# Patient Record
Sex: Female | Born: 1950
Health system: Southern US, Community
[De-identification: ages and names within clinical notes are randomized; demographics above are authoritative.]

## PROBLEM LIST (undated history)

## (undated) DIAGNOSIS — Z860101 Personal history of adenomatous and serrated colon polyps: Secondary | ICD-10-CM

## (undated) DIAGNOSIS — Z86010 Personal history of colonic polyps: Secondary | ICD-10-CM

## (undated) DIAGNOSIS — F338 Other recurrent depressive disorders: Secondary | ICD-10-CM

## (undated) DIAGNOSIS — T7840XA Allergy, unspecified, initial encounter: Secondary | ICD-10-CM

## (undated) DIAGNOSIS — Z780 Asymptomatic menopausal state: Secondary | ICD-10-CM

## (undated) DIAGNOSIS — M549 Dorsalgia, unspecified: Secondary | ICD-10-CM

## (undated) DIAGNOSIS — E785 Hyperlipidemia, unspecified: Secondary | ICD-10-CM

## (undated) DIAGNOSIS — E7800 Pure hypercholesterolemia, unspecified: Secondary | ICD-10-CM

## (undated) HISTORY — DX: Other recurrent depressive disorders: F33.8

## (undated) HISTORY — DX: Dorsalgia, unspecified: M54.9

## (undated) HISTORY — DX: Personal history of colonic polyps: Z86.010

## (undated) HISTORY — PX: COLONOSCOPY: SHX174

## (undated) HISTORY — DX: Allergy, unspecified, initial encounter: T78.40XA

## (undated) HISTORY — DX: Personal history of adenomatous and serrated colon polyps: Z86.0101

## (undated) HISTORY — DX: Hyperlipidemia, unspecified: E78.5

## (undated) HISTORY — DX: Asymptomatic menopausal state: Z78.0

---

## 1998-08-17 ENCOUNTER — Ambulatory Visit (HOSPITAL_COMMUNITY): Admission: RE | Admit: 1998-08-17 | Discharge: 1998-08-17 | Payer: Self-pay | Admitting: Obstetrics and Gynecology

## 1999-02-09 ENCOUNTER — Encounter: Payer: Self-pay | Admitting: Family Medicine

## 1999-02-09 ENCOUNTER — Ambulatory Visit (HOSPITAL_COMMUNITY): Admission: RE | Admit: 1999-02-09 | Discharge: 1999-02-09 | Payer: Self-pay | Admitting: Obstetrics and Gynecology

## 1999-03-16 ENCOUNTER — Encounter (INDEPENDENT_AMBULATORY_CARE_PROVIDER_SITE_OTHER): Payer: Self-pay | Admitting: Specialist

## 1999-03-16 ENCOUNTER — Other Ambulatory Visit: Admission: RE | Admit: 1999-03-16 | Discharge: 1999-03-16 | Payer: Self-pay | Admitting: Obstetrics and Gynecology

## 2000-02-15 ENCOUNTER — Encounter: Payer: Self-pay | Admitting: Family Medicine

## 2000-02-15 ENCOUNTER — Ambulatory Visit (HOSPITAL_COMMUNITY): Admission: RE | Admit: 2000-02-15 | Discharge: 2000-02-15 | Payer: Self-pay | Admitting: Family Medicine

## 2000-11-23 ENCOUNTER — Emergency Department (HOSPITAL_COMMUNITY): Admission: EM | Admit: 2000-11-23 | Discharge: 2000-11-24 | Payer: Self-pay | Admitting: Emergency Medicine

## 2000-11-24 ENCOUNTER — Encounter: Payer: Self-pay | Admitting: Emergency Medicine

## 2001-02-27 ENCOUNTER — Encounter: Payer: Self-pay | Admitting: Family Medicine

## 2001-02-27 ENCOUNTER — Ambulatory Visit (HOSPITAL_COMMUNITY): Admission: RE | Admit: 2001-02-27 | Discharge: 2001-02-27 | Payer: Self-pay | Admitting: Family Medicine

## 2001-04-04 ENCOUNTER — Encounter (INDEPENDENT_AMBULATORY_CARE_PROVIDER_SITE_OTHER): Payer: Self-pay | Admitting: Specialist

## 2001-04-04 ENCOUNTER — Ambulatory Visit (HOSPITAL_COMMUNITY): Admission: RE | Admit: 2001-04-04 | Discharge: 2001-04-04 | Payer: Self-pay | Admitting: *Deleted

## 2001-12-03 ENCOUNTER — Encounter: Payer: Self-pay | Admitting: Obstetrics and Gynecology

## 2001-12-03 ENCOUNTER — Encounter: Admission: RE | Admit: 2001-12-03 | Discharge: 2001-12-03 | Payer: Self-pay | Admitting: Obstetrics and Gynecology

## 2002-04-03 ENCOUNTER — Encounter: Payer: Self-pay | Admitting: Obstetrics and Gynecology

## 2002-04-03 ENCOUNTER — Encounter: Admission: RE | Admit: 2002-04-03 | Discharge: 2002-04-03 | Payer: Self-pay | Admitting: Obstetrics and Gynecology

## 2003-02-03 ENCOUNTER — Other Ambulatory Visit: Admission: RE | Admit: 2003-02-03 | Discharge: 2003-02-03 | Payer: Self-pay | Admitting: Obstetrics and Gynecology

## 2003-04-07 ENCOUNTER — Encounter: Payer: Self-pay | Admitting: Obstetrics and Gynecology

## 2003-04-07 ENCOUNTER — Encounter: Admission: RE | Admit: 2003-04-07 | Discharge: 2003-04-07 | Payer: Self-pay | Admitting: Obstetrics and Gynecology

## 2004-02-09 ENCOUNTER — Other Ambulatory Visit: Admission: RE | Admit: 2004-02-09 | Discharge: 2004-02-09 | Payer: Self-pay | Admitting: Obstetrics and Gynecology

## 2004-05-17 ENCOUNTER — Encounter: Admission: RE | Admit: 2004-05-17 | Discharge: 2004-05-17 | Payer: Self-pay | Admitting: Obstetrics and Gynecology

## 2004-10-11 ENCOUNTER — Ambulatory Visit (HOSPITAL_COMMUNITY): Admission: RE | Admit: 2004-10-11 | Discharge: 2004-10-11 | Payer: Self-pay | Admitting: *Deleted

## 2005-06-02 ENCOUNTER — Encounter: Admission: RE | Admit: 2005-06-02 | Discharge: 2005-06-02 | Payer: Self-pay | Admitting: Family Medicine

## 2006-01-18 ENCOUNTER — Encounter: Payer: Self-pay | Admitting: Obstetrics and Gynecology

## 2006-04-03 ENCOUNTER — Ambulatory Visit: Payer: Self-pay | Admitting: Family Medicine

## 2006-06-05 ENCOUNTER — Encounter: Admission: RE | Admit: 2006-06-05 | Discharge: 2006-06-05 | Payer: Self-pay | Admitting: Family Medicine

## 2006-06-09 ENCOUNTER — Ambulatory Visit: Payer: Self-pay | Admitting: Family Medicine

## 2006-06-16 ENCOUNTER — Ambulatory Visit: Payer: Self-pay | Admitting: Family Medicine

## 2006-12-19 ENCOUNTER — Ambulatory Visit: Payer: Self-pay | Admitting: Family Medicine

## 2007-04-27 ENCOUNTER — Ambulatory Visit: Payer: Self-pay | Admitting: Family Medicine

## 2007-06-13 ENCOUNTER — Encounter: Admission: RE | Admit: 2007-06-13 | Discharge: 2007-06-13 | Payer: Self-pay | Admitting: Obstetrics and Gynecology

## 2007-06-19 ENCOUNTER — Ambulatory Visit: Payer: Self-pay | Admitting: Family Medicine

## 2007-09-26 ENCOUNTER — Ambulatory Visit: Payer: Self-pay | Admitting: Family Medicine

## 2007-12-21 ENCOUNTER — Ambulatory Visit: Payer: Self-pay | Admitting: Family Medicine

## 2008-02-21 ENCOUNTER — Ambulatory Visit: Payer: Self-pay | Admitting: Family Medicine

## 2008-07-03 ENCOUNTER — Ambulatory Visit: Payer: Self-pay | Admitting: Family Medicine

## 2008-07-16 ENCOUNTER — Ambulatory Visit: Payer: Self-pay | Admitting: Family Medicine

## 2008-09-15 ENCOUNTER — Ambulatory Visit: Payer: Self-pay | Admitting: Family Medicine

## 2008-10-24 ENCOUNTER — Encounter: Admission: RE | Admit: 2008-10-24 | Discharge: 2008-10-24 | Payer: Self-pay | Admitting: Family Medicine

## 2009-04-23 ENCOUNTER — Ambulatory Visit: Payer: Self-pay | Admitting: Family Medicine

## 2009-06-17 ENCOUNTER — Ambulatory Visit: Payer: Self-pay | Admitting: Family Medicine

## 2009-07-21 ENCOUNTER — Ambulatory Visit: Payer: Self-pay | Admitting: Family Medicine

## 2009-11-12 ENCOUNTER — Encounter: Admission: RE | Admit: 2009-11-12 | Discharge: 2009-11-12 | Payer: Self-pay | Admitting: Family Medicine

## 2010-01-04 ENCOUNTER — Ambulatory Visit: Payer: Self-pay | Admitting: Family Medicine

## 2010-07-22 ENCOUNTER — Ambulatory Visit: Payer: Self-pay | Admitting: Family Medicine

## 2010-10-11 ENCOUNTER — Ambulatory Visit
Admission: RE | Admit: 2010-10-11 | Discharge: 2010-10-11 | Payer: Self-pay | Source: Home / Self Care | Attending: Family Medicine | Admitting: Family Medicine

## 2010-12-23 ENCOUNTER — Other Ambulatory Visit: Payer: Self-pay | Admitting: Obstetrics & Gynecology

## 2010-12-23 DIAGNOSIS — Z1231 Encounter for screening mammogram for malignant neoplasm of breast: Secondary | ICD-10-CM

## 2010-12-29 ENCOUNTER — Ambulatory Visit
Admission: RE | Admit: 2010-12-29 | Discharge: 2010-12-29 | Disposition: A | Discharge status: Home / Self Care | Payer: 59 | Source: Ambulatory Visit | Attending: Obstetrics & Gynecology | Admitting: Obstetrics & Gynecology

## 2010-12-29 DIAGNOSIS — Z1231 Encounter for screening mammogram for malignant neoplasm of breast: Secondary | ICD-10-CM

## 2010-12-29 LAB — HM MAMMOGRAPHY

## 2010-12-31 ENCOUNTER — Other Ambulatory Visit: Payer: Self-pay | Admitting: Obstetrics & Gynecology

## 2010-12-31 DIAGNOSIS — R928 Other abnormal and inconclusive findings on diagnostic imaging of breast: Secondary | ICD-10-CM

## 2011-01-05 ENCOUNTER — Ambulatory Visit
Admission: RE | Admit: 2011-01-05 | Discharge: 2011-01-05 | Disposition: A | Discharge status: Home / Self Care | Payer: 59 | Source: Ambulatory Visit | Attending: Obstetrics & Gynecology | Admitting: Obstetrics & Gynecology

## 2011-01-05 DIAGNOSIS — R928 Other abnormal and inconclusive findings on diagnostic imaging of breast: Secondary | ICD-10-CM

## 2011-03-03 ENCOUNTER — Ambulatory Visit (INDEPENDENT_AMBULATORY_CARE_PROVIDER_SITE_OTHER): Payer: 59 | Admitting: Medical

## 2011-03-03 ENCOUNTER — Encounter: Payer: Self-pay | Admitting: Medical

## 2011-03-03 VITALS — BP 118/82 | HR 88 | Ht 64.0 in | Wt 150.0 lb

## 2011-03-03 DIAGNOSIS — J329 Chronic sinusitis, unspecified: Secondary | ICD-10-CM

## 2011-03-03 MED ORDER — AMOXICILLIN 875 MG PO TABS: 875.0000 mg | ORAL_TABLET | Freq: Two times a day (BID) | ORAL | Status: AC

## 2011-03-03 MED ORDER — PROMETHAZINE-DM 6.25-15 MG/5ML PO SYRP: 5.0000 mL | ORAL_SOLUTION | Freq: Four times a day (QID) | ORAL | Status: AC | PRN

## 2011-03-03 NOTE — Progress Notes (Signed)
Subjective:     Marilyn Dixon is a 60 y.o. female who presents for evaluation of lingering cold.  She notes just getting back from trip to [place].  Had a cold that began about 1.5 week ago, but still has lingering symptoms.   Wants to make sure she didn't pick up some bug from [place].  Symptoms include: congestion, cough, sinus pressure and ear pressure.  Symptoms have been unchanged since that time. Past history is significant for prior hx/o sinsusitis.  No other aggravating or relieving factors.  No other c/o.  The following portions of the patient's history were reviewed and updated as appropriate: allergies, current medications, past family history, past medical history, past social history, past surgical history and problem list.  Past Medical History  Diagnosis Date  . Allergy   . Back pain   . Dyslipidemia   . Glaucoma    Review of Systems Constitutional: denies fever, chills, sweats, anorexia Skin: denies rash HEENT: denies ear pain, sore throat, itchy watery eyes Cardiovascular: denies chest pain Lungs: denies wheezing Abdomen: denies abdominal pain, vomiting, diarrhea GU: denies dysuria  Objective:   Filed Vitals:   03/03/11 1018  BP: 118/82  Pulse: 88    General appearance: Alert, WD/WN, no distress                             Skin: warm, no rash                           Head: +mild frontal sinus tenderness,                            Eyes: conjunctiva normal, corneas clear, PERRLA                            Ears: pearly TMs, external ear canals normal                          Nose: septum midline, turbinates swollen, with erythema and clear discharge             Mouth/throat: MMM, tongue normal, mild pharyngeal erythema                           Neck: supple, no adenopathy, no thyromegaly, nontender                          Heart: RRR, normal S1, S2, no murmurs                         Lungs: CTA bilaterally, no wheezes, rales, or rhonchi      Assessment:     Encounter Diagnosis  Name Primary?  . Sinusitis Yes      Plan:   Prescription given for Amoxicillin, Promethazine DM for cough and nausea.  Can use OTC Mucinex DM for congestion.  Tylenol or Ibuprofen OTC for fever and malaise.  Discussed symptomatic relief, nasal saline, and call or return if worse or not improving in 2-3 days.

## 2011-03-03 NOTE — Patient Instructions (Signed)

## 2011-03-16 ENCOUNTER — Telehealth: Payer: Self-pay | Admitting: Family Medicine

## 2011-03-16 MED ORDER — ESCITALOPRAM OXALATE 10 MG PO TABS: 10.0000 mg | ORAL_TABLET | Freq: Every day | ORAL | Status: DC

## 2011-03-17 NOTE — Telephone Encounter (Signed)
Handled by v kds

## 2011-03-18 ENCOUNTER — Encounter: Payer: Self-pay | Admitting: Family Medicine

## 2011-04-28 ENCOUNTER — Encounter: Payer: Self-pay | Admitting: Family Medicine

## 2011-07-26 ENCOUNTER — Encounter: Payer: Self-pay | Admitting: Family Medicine

## 2011-07-26 ENCOUNTER — Ambulatory Visit (INDEPENDENT_AMBULATORY_CARE_PROVIDER_SITE_OTHER): Payer: 59 | Admitting: Family Medicine

## 2011-07-26 VITALS — BP 114/80 | HR 64 | Ht 64.0 in | Wt 149.0 lb

## 2011-07-26 DIAGNOSIS — E785 Hyperlipidemia, unspecified: Secondary | ICD-10-CM | POA: Insufficient documentation

## 2011-07-26 DIAGNOSIS — Z0000 Encounter for general adult medical examination without abnormal findings: Secondary | ICD-10-CM

## 2011-07-26 DIAGNOSIS — M7710 Lateral epicondylitis, unspecified elbow: Secondary | ICD-10-CM

## 2011-07-26 DIAGNOSIS — Z79899 Other long term (current) drug therapy: Secondary | ICD-10-CM

## 2011-07-26 DIAGNOSIS — H409 Unspecified glaucoma: Secondary | ICD-10-CM | POA: Insufficient documentation

## 2011-07-26 DIAGNOSIS — J301 Allergic rhinitis due to pollen: Secondary | ICD-10-CM

## 2011-07-26 LAB — CBC WITH DIFFERENTIAL/PLATELET
Basophils Absolute: 0 10*3/uL (ref 0.0–0.1)
HCT: 45.6 % (ref 36.0–46.0)
Lymphs Abs: 1.6 10*3/uL (ref 0.7–4.0)
MCH: 31.4 pg (ref 26.0–34.0)
MCHC: 33.6 g/dL (ref 30.0–36.0)
MCV: 93.4 fL (ref 78.0–100.0)
Monocytes Absolute: 0.4 10*3/uL (ref 0.1–1.0)
Neutro Abs: 4.2 10*3/uL (ref 1.7–7.7)
Neutrophils Relative %: 67 % (ref 43–77)
RBC: 4.88 MIL/uL (ref 3.87–5.11)
WBC: 6.2 10*3/uL (ref 4.0–10.5)

## 2011-07-26 LAB — LIPID PANEL
Cholesterol: 183 mg/dL (ref 0–200)
LDL Cholesterol: 105 mg/dL — ABNORMAL HIGH (ref 0–99)
Triglycerides: 86 mg/dL (ref <150)

## 2011-07-26 LAB — COMPREHENSIVE METABOLIC PANEL
Albumin: 4.6 g/dL (ref 3.5–5.2)
BUN: 14 mg/dL (ref 6–23)
Sodium: 140 mEq/L (ref 135–145)

## 2011-07-26 NOTE — Progress Notes (Signed)
Subjective:    Patient ID: Marilyn Dixon, female    DOB: 12/14/1950, 60 y.o.   MRN: [id number]  HPI She is here for complete examination. She continues on Lipitor and having no difficulty with this. She also continues on Lexapro. She has seen her gynecologist and presently is on Vivelle as well as progesterone. She does have underlying allergies but rarely takes medicines for this to She is going to be seeing her ophthalmologist for an eye exam and glaucoma check. She does complain of right elbow tingling sensation especially with use. He also continues to have neck and shoulder pain and blames it on arthritis. She has used various modalities for control of this including acupuncture and chiropractic.   Review of Systems  Constitutional: Negative.   HENT: Negative.   Eyes: Negative.   Respiratory: Negative.   Cardiovascular: Negative.   Gastrointestinal: Negative.   Genitourinary: Negative.   Musculoskeletal: Negative.   Skin: Negative.   Neurological: Negative.   Hematological: Negative.   Psychiatric/Behavioral: Negative.        Objective:   Physical Exam BP 114/80  Pulse 64  Ht 5\' 4"  (1.626 m)  Wt 149 lb (67.586 kg)  BMI 25.58 kg/m2  General Appearance:    Alert, cooperative, no distress, appears stated age  Head:    Normocephalic, without obvious abnormality, atraumatic  Eyes:    PERRL, conjunctiva/corneas clear, EOM's intact, fundi    benign  Ears:    Normal TM's and external ear canals  Nose:   Nares normal, mucosa normal, no drainage or sinus   tenderness  Throat:   Lips, mucosa, and tongue normal; teeth and gums normal  Neck:   Supple, no lymphadenopathy;  thyroid:  no   enlargement/tenderness/nodules; no carotid   bruit or JVD  Back:    Spine nontender, no curvature, ROM normal, no CVA     tenderness  Lungs:     Clear to auscultation bilaterally without wheezes, rales or     ronchi; respirations unlabored  Chest Wall:    No tenderness or deformity   Heart:     Regular rate and rhythm, S1 and S2 normal, no murmur, rub   or gallop  Breast Exam:    Deferred to GYN  Abdomen:     Soft, non-tender, nondistended, normoactive bowel sounds,    no masses, no hepatosplenomegaly  Genitalia:    Deferred to GYN     Extremities:   No clubbing, cyanosis or edema.exam of right elbow shows tenderness to palpation over the lateral epicondyle with provocative testing causing pain.   Pulses:   2+ and symmetric all extremities  Skin:   Skin color, texture, turgor normal, no rashes or lesions  Lymph nodes:   Cervical, supraclavicular, and axillary nodes normal  Neurologic:   CNII-XII intact, normal strength, sensation and gait; reflexes 2+ and symmetric throughout          Psych:   Normal mood, affect, hygiene and grooming.          Assessment & Plan:   1. Allergic rhinitis due to pollen    2. Hyperlipidemia LDL goal < 100  Lipid panel  3. Glaucoma    4. Encounter for long-term (current) use of other medications  CBC with Differential, Comprehensive metabolic panel, Lipid panel  5. Routine general medical examination at a health care facility  CBC with Differential, Comprehensive metabolic panel, Lipid panel  6. Lateral epicondylitis  of elbow     continue on present medication  regimen. Discussed wrist positioning to help with her tendinitis.

## 2011-07-26 NOTE — Patient Instructions (Signed)
Keep taking good care of yourself 

## 2011-08-29 ENCOUNTER — Ambulatory Visit (INDEPENDENT_AMBULATORY_CARE_PROVIDER_SITE_OTHER): Payer: 59 | Admitting: Family Medicine

## 2011-08-29 ENCOUNTER — Encounter: Payer: Self-pay | Admitting: Family Medicine

## 2011-08-29 VITALS — BP 116/70 | HR 86 | Wt 157.0 lb

## 2011-08-29 DIAGNOSIS — M549 Dorsalgia, unspecified: Secondary | ICD-10-CM

## 2011-08-29 DIAGNOSIS — G8929 Other chronic pain: Secondary | ICD-10-CM

## 2011-08-29 NOTE — Progress Notes (Signed)
  Subjective:    Patient ID: Marilyn Dixon, female    DOB: 1951-06-28, 60 y.o.   MRN: [id number]  HPI Here for evaluation of upper back and neck pain. She has a long history of difficulty with this. It's been especially bothersome in the last 3 weeks. She cannot relate this specifically to any particular activity specifically work she has tried over-the-counter medications with some relief of her symptoms. She has tried acupuncture in the past and was successful.   Review of Systems     Objective:   Physical Exam Alert and in no distress. Slight tenderness to palpation is noted in the posterior neck and trapezius area. No lesions were palpable. Normal motor sensory and DTRs. Normal motion of the neck.       Assessment & Plan:   1. Upper back pain, chronic  Ambulatory referral to Physical Therapy   Discussed proper posturing with her as well as heat and stretching maneuvers. Also discussed proper sitting while driving.

## 2011-08-29 NOTE — Patient Instructions (Signed)
Use heat to your neck and upper back for 20 minutes 3 times per day. Work on proper posturing and range of motion. He can use Advil or Aleve on a regular basis. We'll set up for physical therapy. You can also get some acupuncture

## 2011-09-23 ENCOUNTER — Encounter: Payer: Self-pay | Admitting: Medical

## 2011-09-23 ENCOUNTER — Ambulatory Visit (INDEPENDENT_AMBULATORY_CARE_PROVIDER_SITE_OTHER): Payer: 59 | Admitting: Medical

## 2011-09-23 VITALS — BP 160/88 | HR 100 | Temp 98.8°F | Resp 18

## 2011-09-23 DIAGNOSIS — M62830 Muscle spasm of back: Secondary | ICD-10-CM

## 2011-09-23 DIAGNOSIS — M5380 Other specified dorsopathies, site unspecified: Secondary | ICD-10-CM

## 2011-09-23 MED ORDER — NAPROXEN SODIUM ER 750 MG PO TB24: 1.0000 | ORAL_TABLET | Freq: Every day | ORAL | Status: DC

## 2011-09-23 MED ORDER — CYCLOBENZAPRINE HCL 10 MG PO TABS: ORAL_TABLET | ORAL | Status: DC

## 2011-09-23 NOTE — Progress Notes (Signed)
  Subjective:    Marilyn Dixon is a 61 y.o. female who presents for back pain and spasm.  She saw Dr. Susann Givens here recently for upper back pain, was referred to physical therapy.  She has been to 2 PT sessions thus far.  She notes that she was at work today, went to walk into another room to sit down, and all of the sudden started getting spasm in her low back.  She has had similar severe spasm one other time 20 years ago relieved by muscle relaxer.  A coworker realized her pain, gave her a cold pack, and she left work to come here.   She feels current spasm in right low back. Denies recent injury or trauma, but has been lifting heavy laundry basket at home.  Took down the Christmas tree recently.  Using nothing for symptoms.   The following portions of the patient's history were reviewed and updated as appropriate: allergies, current medications, past family history, past medical history, past social history, past surgical history and problem list.  Review of Systems Constitutional: denies fever, chills, sweats, unexpected weight change, anorexia, fatigue Cardiology: denies chest pain, palpitations, edema Respiratory: denies cough, shortness of breath Gastroenterology: denies abdominal pain, nausea, vomiting, diarrhea, constipation Musculoskeletal: denies arthralgias, joint swelling Urology: denies dysuria, difficulty urinating, hematuria, urinary frequency, urgency, incontinence Neurology: no headache, weakness, tingling, numbness  Past Medical History  Diagnosis Date  . Allergy   . Back pain   . Dyslipidemia   . Glaucoma       Objective:    Filed Vitals:   09/23/11 1556  BP: 160/88  Pulse: 100  Temp: 98.8 F (37.1 C)  Resp: 18    General appearance: alert, no distress, WD/WN, female Chest: non tender, normal shape and expansion Heart: RRR, normal S1, S2, no murmurs Lungs: CTA bilaterally, no wheezes, rhonchi, or rales Abdomen: +bs, soft, non tender, non distended tender  througout rihgt lower back, +spasm, decreased ROM and pain wiht ROM, -SLR Extremities: no edema, no cyanosis, no clubbing Pulses: 2+ symmetric, upper and lower extremities     Assessment:    Encounter Diagnosis  Name Primary?  . Spasm of back muscles Yes      Plan:    Natural history and expected course discussed. Questions answered. Proper lifting, bending technique discussed. Stretching exercises discussed. Regular aerobic and trunk strengthening exercises discussed. Short (2-4 day) period of relative rest recommended until acute symptoms improve. Heat to affected area as needed for local pain relief. NSAIDs per medication orders. Muscle relaxants per medication orders. F/u prn.  Resume PT once symptoms improve.

## 2011-09-23 NOTE — Patient Instructions (Signed)
Begin Flexeril muscle relaxer, 1/2 -1 tablet at bedtime or up to 3 times daily if needed for spasm of back.   Rest, use a heat pad over the area.  Begin samples of Naprelan 750mg , one tablet today and tomorrow, then the 375mg  tablets for the next 2 days if needed.  Hold off on physical therapy for at least the next 3-4 days.  Once things get back to baseline, then resume physical therapy.

## 2011-10-04 ENCOUNTER — Telehealth: Payer: Self-pay | Admitting: Family Medicine

## 2011-10-06 MED ORDER — ESCITALOPRAM OXALATE 10 MG PO TABS: 10.0000 mg | ORAL_TABLET | Freq: Every day | ORAL | Status: DC

## 2011-10-06 NOTE — Telephone Encounter (Signed)
Lexapro called in.

## 2011-11-23 ENCOUNTER — Other Ambulatory Visit: Payer: Self-pay | Admitting: Family Medicine

## 2012-02-25 ENCOUNTER — Encounter (HOSPITAL_COMMUNITY): Payer: Self-pay | Admitting: *Deleted

## 2012-02-25 ENCOUNTER — Emergency Department (HOSPITAL_COMMUNITY)
Admission: EM | Admit: 2012-02-25 | Discharge: 2012-02-25 | Disposition: A | Discharge status: Home / Self Care | Payer: 59 | Attending: Emergency Medicine | Admitting: Emergency Medicine

## 2012-02-25 DIAGNOSIS — E7800 Pure hypercholesterolemia, unspecified: Secondary | ICD-10-CM | POA: Insufficient documentation

## 2012-02-25 DIAGNOSIS — R404 Transient alteration of awareness: Secondary | ICD-10-CM | POA: Insufficient documentation

## 2012-02-25 DIAGNOSIS — R55 Syncope and collapse: Secondary | ICD-10-CM

## 2012-02-25 DIAGNOSIS — D72829 Elevated white blood cell count, unspecified: Secondary | ICD-10-CM | POA: Insufficient documentation

## 2012-02-25 DIAGNOSIS — F430 Acute stress reaction: Secondary | ICD-10-CM | POA: Insufficient documentation

## 2012-02-25 DIAGNOSIS — R112 Nausea with vomiting, unspecified: Secondary | ICD-10-CM | POA: Insufficient documentation

## 2012-02-25 HISTORY — DX: Pure hypercholesterolemia, unspecified: E78.00

## 2012-02-25 LAB — BASIC METABOLIC PANEL WITH GFR
BUN: 15 mg/dL (ref 6–23)
CO2: 24 meq/L (ref 19–32)
Calcium: 9.5 mg/dL (ref 8.4–10.5)
Chloride: 106 meq/L (ref 96–112)
Creatinine, Ser: 0.72 mg/dL (ref 0.50–1.10)
GFR calc Af Amer: >90 mL/min
GFR calc non Af Amer: >90 mL/min
Glucose, Bld: 93 mg/dL (ref 70–99)
Potassium: 4.9 meq/L (ref 3.5–5.1)
Sodium: 140 meq/L (ref 135–145)

## 2012-02-25 LAB — URINALYSIS, ROUTINE W REFLEX MICROSCOPIC
Bilirubin Urine: NEGATIVE
Glucose, UA: NEGATIVE mg/dL
Hgb urine dipstick: NEGATIVE
Ketones, ur: 15 mg/dL — AB
Leukocytes, UA: NEGATIVE
Nitrite: NEGATIVE
Protein, ur: NEGATIVE mg/dL
Specific Gravity, Urine: 1.013 (ref 1.005–1.030)
Urobilinogen, UA: 0.2 mg/dL (ref 0.0–1.0)
pH: 6 (ref 5.0–8.0)

## 2012-02-25 LAB — CBC
HCT: 45 % (ref 36.0–46.0)
Hemoglobin: 15.6 g/dL — ABNORMAL HIGH (ref 12.0–15.0)
MCH: 31.1 pg (ref 26.0–34.0)
MCHC: 34.7 g/dL (ref 30.0–36.0)
MCV: 89.8 fL (ref 78.0–100.0)
Platelets: 255 K/uL (ref 150–400)
RBC: 5.01 MIL/uL (ref 3.87–5.11)
RDW: 12.6 % (ref 11.5–15.5)
WBC: 14.7 K/uL — ABNORMAL HIGH (ref 4.0–10.5)

## 2012-02-25 MED ORDER — SODIUM CHLORIDE 0.9 % IV BOLUS (SEPSIS)
1000.0000 mL | Freq: Once | INTRAVENOUS | Status: AC
  Administered 2012-02-25: 1000 mL via INTRAVENOUS

## 2012-02-25 MED ORDER — LORAZEPAM 1 MG PO TABS: 0.5000 mg | ORAL_TABLET | Freq: Three times a day (TID) | ORAL | Status: DC | PRN

## 2012-02-25 NOTE — ED Notes (Signed)
Pt up to restroom, unable to obtain urine specimen at this time

## 2012-02-25 NOTE — ED Provider Notes (Addendum)
History     CSN: [id number]  Arrival date & time 02/25/12  1243   First MD Initiated Contact with Patient 02/25/12 1323      Chief Complaint  Patient presents with  . Loss of Consciousness    (Consider location/radiation/quality/duration/timing/severity/associated sxs/prior treatment) Patient is a 61 y.o. female presenting with syncope. The history is provided by the patient.  Loss of Consciousness This is a new problem. The current episode started today. The problem has been gradually improving. Associated symptoms include nausea, vomiting and weakness. Pertinent negatives include no abdominal pain, chest pain or fever. Associated symptoms comments: She was at a funeral when she began to feel lightheaded, began to sweat and feel like she was going to pass out. She did not fully syncopize, but became nauseas and vomited x 1. She states she has been under a significant amount of stress recently, sleeping little, while having stressors of multiple deaths in the family. No chest pain, shortness of breath, or palpitations. .    Past Medical History  Diagnosis Date  . Allergy   . Back pain   . Dyslipidemia   . Glaucoma   . High cholesterol     History reviewed. No pertinent past surgical history.  History reviewed. No pertinent family history.  History  Substance Use Topics  . Smoking status: Never Smoker   . Smokeless tobacco: Never Used  . Alcohol Use: 1.5 oz/week    3 drink(s) per week    OB History    Grav Para Term Preterm Abortions TAB SAB Ect Mult Living                  Review of Systems  Constitutional: Negative for fever.  Respiratory: Negative for shortness of breath.   Cardiovascular: Positive for syncope. Negative for chest pain and palpitations.  Gastrointestinal: Positive for nausea and vomiting. Negative for abdominal pain.  Neurological: Positive for weakness and light-headedness.    Allergies  Sulfa antibiotics  Home Medications   Current  Outpatient Rx  Name Route Sig Dispense Refill  . ASPIRIN 81 MG PO CHEW Oral Chew 81 mg by mouth daily.    . ATORVASTATIN CALCIUM 10 MG PO TABS  TAKE 1 TABLET ONCE DAILY 90 tablet 1  . VITAMIN D 1000 UNITS PO TABS Oral Take 1,000 Units by mouth daily.    [name] Kitchen ESCITALOPRAM OXALATE 10 MG PO TABS Oral Take 1 tablet (10 mg total) by mouth daily. 90 tablet 3  . ESTRADIOL 0.0375 MG/24HR TD PTTW Transdermal Place 1 patch onto the skin 2 (two) times a week.      . ADULT MULTIVITAMIN W/MINERALS CH Oral Take 1 tablet by mouth daily.    [name] Kitchen PROGESTERONE MICRONIZED 100 MG PO CAPS Oral Take 100 mg by mouth daily.        BP 146/95  Pulse 88  Temp(Src) 97.6 F (36.4 C) (Oral)  Resp 16  SpO2 99%  Physical Exam  Constitutional: She is oriented to person, place, and time. She appears well-developed and well-nourished. No distress.  Neck: Normal range of motion.       No carotid bruit.  Cardiovascular: Normal rate and regular rhythm.   No murmur heard. Pulmonary/Chest: Effort normal. She has no wheezes. She has no rales.  Abdominal: Soft. There is no tenderness.  Musculoskeletal: Normal range of motion. She exhibits no edema.  Neurological: She is alert and oriented to person, place, and time.  Skin: Skin is warm and dry.  Psychiatric: She has  a normal mood and affect.    ED Course  Procedures (including critical care time) Results for orders placed during the hospital encounter of 02/25/12  CBC      Component Value Range   WBC 14.7 (*) 4.0 - 10.5 (K/uL)   RBC 5.01  3.87 - 5.11 (MIL/uL)   Hemoglobin 15.6 (*) 12.0 - 15.0 (g/dL)   HCT 16.1  09.6 - 04.5 (%)   MCV 89.8  78.0 - 100.0 (fL)   MCH 31.1  26.0 - 34.0 (pg)   MCHC 34.7  30.0 - 36.0 (g/dL)   RDW 40.9  81.1 - 91.4 (%)   Platelets 255  150 - 400 (K/uL)  BASIC METABOLIC PANEL      Component Value Range   Sodium 140  135 - 145 (mEq/L)   Potassium 4.9  3.5 - 5.1 (mEq/L)   Chloride 106  96 - 112 (mEq/L)   CO2 24  19 - 32 (mEq/L)   Glucose,  Bld 93  70 - 99 (mg/dL)   BUN 15  6 - 23 (mg/dL)   Creatinine, Ser 7.82  0.50 - 1.10 (mg/dL)   Calcium 9.5  8.4 - 95.6 (mg/dL)   GFR calc non Af Amer >90  >90 (mL/min)   GFR calc Af Amer >90  >90 (mL/min)     Labs Reviewed  CBC  URINALYSIS, ROUTINE W REFLEX MICROSCOPIC  BASIC METABOLIC PANEL   Date: 02/25/2012  Rate: 73  Rhythm: normal sinus rhythm  QRS Axis: normal  Intervals: normal  ST/T Wave abnormalities: normal  Conduction Disutrbances:none  Narrative Interpretation:   Old EKG Reviewed: none available   No results found.   No diagnosis found.  1. Probable vasovagal near syncope 2. Stress reaction  MDM  Waiting on urine. Patient has mild leukocytosis, but no recurrent lightheadedness. Not orthostatic. Will wait for urinalysis but anticipate discharge home. Follow up with PCP-[facility] for recheck this week.   She continues to be asymptomatic. Will discharge home.           Rodena Medin, PA-C 02/25/12 1549  Rodena Medin, PA-C 02/25/12 1641

## 2012-02-25 NOTE — Discharge Instructions (Signed)
YOU CAN BE DISCHARGED HOME AND SHOULD FOLLOW UP WITH DR. Susann Givens THIS WEEK FOR RECHECK. YOU CAN TAKE ATIVAN IF NEEDED FOR STRESS/ANXIETY. RETURN HERE AS NEEDED FOR RECURRENT SYMPTOMS OR NEW CONCERN.

## 2012-02-25 NOTE — ED Notes (Signed)
Pt arrived by [facility] for near syncopal episode while at funeral. Vomited x 2 pta, bp 160/102.cbg 153

## 2012-02-25 NOTE — ED Provider Notes (Signed)
Medical screening examination/treatment/procedure(s) were performed by non-physician practitioner and as supervising physician I was immediately available for consultation/collaboration.   Cotina Freedman, MD 02/25/12 1616 

## 2012-02-26 NOTE — ED Provider Notes (Signed)
Medical screening examination/treatment/procedure(s) were performed by non-physician practitioner and as supervising physician I was immediately available for consultation/collaboration.   Loren Racer, MD 02/26/12 1525

## 2012-02-27 ENCOUNTER — Telehealth: Payer: Self-pay | Admitting: Family Medicine

## 2012-02-27 ENCOUNTER — Ambulatory Visit (INDEPENDENT_AMBULATORY_CARE_PROVIDER_SITE_OTHER): Payer: 59 | Admitting: Family Medicine

## 2012-02-27 ENCOUNTER — Encounter: Payer: Self-pay | Admitting: Family Medicine

## 2012-02-27 VITALS — BP 100/60 | HR 83 | Wt 154.0 lb

## 2012-02-27 DIAGNOSIS — Z638 Other specified problems related to primary support group: Secondary | ICD-10-CM

## 2012-02-27 DIAGNOSIS — Z6379 Other stressful life events affecting family and household: Secondary | ICD-10-CM

## 2012-02-27 DIAGNOSIS — M25561 Pain in right knee: Secondary | ICD-10-CM

## 2012-02-27 DIAGNOSIS — M25569 Pain in unspecified knee: Secondary | ICD-10-CM

## 2012-02-27 DIAGNOSIS — F4321 Adjustment disorder with depressed mood: Secondary | ICD-10-CM

## 2012-02-27 DIAGNOSIS — R55 Syncope and collapse: Secondary | ICD-10-CM

## 2012-02-27 DIAGNOSIS — G479 Sleep disorder, unspecified: Secondary | ICD-10-CM

## 2012-02-27 NOTE — Patient Instructions (Signed)
Call me if you need anything

## 2012-02-27 NOTE — Telephone Encounter (Signed)
Go ahead and write her this note

## 2012-02-27 NOTE — Progress Notes (Signed)
  Subjective:    Patient ID: Marilyn Dixon, female    DOB: 1950-10-26, 61 y.o.   MRN: [id number]  HPI She is here for followup visit after recent visit to the emergency room. She was diagnosed with a syncopal episode and being under a lot of stress. She also apparently injured her right knee in the syncopal episode she had. Over the last several months she has had the death of her mother-in-law and father-in-law. She was close to both of them. She is going to the grieving process. This has interfered with her sleep. Recently this has also interfered with her ability to work. She has gotten involved with hospice bereavement.   Review of Systems     Objective:   Physical Exam Alert and in no distress. The medical record was reviewed. Cardiac exam shows regular rhythm without murmurs or gallops. Lungs are clear to auscultation. Exam of her right knee does show some slight tenderness palpation over the inferior pole of patella with no laxity or effusion.       Assessment & Plan:  Right knee pain 1. Stressful life event affecting family   2. Grieving   3. Syncopal episodes   4. Sleep disturbance    supportive care for the right knee pain.  I had a long discussion with her concerning all of these issues spending over 25 minutes. Encouraged her to continue in hospice. Also discussed this with her husband and encouraged him to do the same thing. She has been using Ativan given to her in the emergency room for sleep and will continue on this. I will give her a note to get out of work for the next week to help her and her husband.

## 2012-02-28 ENCOUNTER — Encounter: Payer: Self-pay | Admitting: Family Medicine

## 2012-02-28 NOTE — Telephone Encounter (Signed)
LETTER WRITTEN & FAXED TO [phone]

## 2012-03-05 ENCOUNTER — Telehealth: Payer: Self-pay | Admitting: Family Medicine

## 2012-03-05 MED ORDER — LORAZEPAM 1 MG PO TABS: 0.5000 mg | ORAL_TABLET | Freq: Three times a day (TID) | ORAL | Status: AC | PRN

## 2012-03-05 NOTE — Telephone Encounter (Signed)
Called med in per jcl 

## 2012-03-05 NOTE — Telephone Encounter (Signed)
Renew the Ativan

## 2012-03-13 ENCOUNTER — Other Ambulatory Visit: Payer: Self-pay | Admitting: Obstetrics & Gynecology

## 2012-03-13 DIAGNOSIS — Z1231 Encounter for screening mammogram for malignant neoplasm of breast: Secondary | ICD-10-CM

## 2012-03-27 ENCOUNTER — Ambulatory Visit
Admission: RE | Admit: 2012-03-27 | Discharge: 2012-03-27 | Disposition: A | Discharge status: Home / Self Care | Payer: 59 | Source: Ambulatory Visit | Attending: Obstetrics & Gynecology | Admitting: Obstetrics & Gynecology

## 2012-03-27 DIAGNOSIS — Z1231 Encounter for screening mammogram for malignant neoplasm of breast: Secondary | ICD-10-CM

## 2012-07-11 ENCOUNTER — Encounter: Payer: Self-pay | Admitting: Internal Medicine

## 2012-07-26 ENCOUNTER — Other Ambulatory Visit: Payer: Self-pay

## 2012-07-26 ENCOUNTER — Encounter: Payer: Self-pay | Admitting: Family Medicine

## 2012-07-26 ENCOUNTER — Ambulatory Visit (INDEPENDENT_AMBULATORY_CARE_PROVIDER_SITE_OTHER): Payer: 59 | Admitting: Family Medicine

## 2012-07-26 VITALS — BP 122/80 | HR 100 | Ht 63.5 in | Wt 156.0 lb

## 2012-07-26 DIAGNOSIS — Z0000 Encounter for general adult medical examination without abnormal findings: Secondary | ICD-10-CM

## 2012-07-26 DIAGNOSIS — H409 Unspecified glaucoma: Secondary | ICD-10-CM

## 2012-07-26 DIAGNOSIS — J301 Allergic rhinitis due to pollen: Secondary | ICD-10-CM

## 2012-07-26 DIAGNOSIS — E785 Hyperlipidemia, unspecified: Secondary | ICD-10-CM

## 2012-07-26 LAB — LIPID PANEL
Total CHOL/HDL Ratio: 3.2 Ratio
VLDL: 25 mg/dL (ref 0–40)

## 2012-07-26 LAB — COMPREHENSIVE METABOLIC PANEL
AST: 20 U/L (ref 0–37)
Albumin: 4.4 g/dL (ref 3.5–5.2)
Alkaline Phosphatase: 103 U/L (ref 39–117)
Glucose, Bld: 83 mg/dL (ref 70–99)
Potassium: 4.2 mEq/L (ref 3.5–5.3)
Sodium: 140 mEq/L (ref 135–145)
Total Protein: 6.7 g/dL (ref 6.0–8.3)

## 2012-07-26 MED ORDER — ATORVASTATIN CALCIUM 10 MG PO TABS: 10.0000 mg | ORAL_TABLET | Freq: Every day | ORAL | Status: DC

## 2012-07-26 NOTE — Telephone Encounter (Signed)
Med sent in for pt  

## 2012-07-26 NOTE — Progress Notes (Signed)
  Subjective:    Patient ID: Marilyn Dixon, female    DOB: [birth year], 61 y.o.   MRN: [id number]  HPI She is here for complete examination. She did taper off the Lexapro the summer and states she is doing well. She has noted slight fluctuations in her mood but nothing of significance. She continues on medications listed in the chart. She is still on estrogen replacement. She also continues on a multivitamin. She's had a mammogram as well as colonoscopy and Pap. She is considering retiring and will probably do this within the next year or 2. She and her husband are getting along quite well. She has started an exercise program.  Review of Systems  Constitutional: Negative.   HENT: Negative.   Eyes: Negative.   Respiratory: Negative.   Gastrointestinal: Negative.   Genitourinary: Negative.   Neurological: Negative.   Hematological: Negative.   Psychiatric/Behavioral: Negative.        Objective:   Physical Exam BP 122/80  Pulse 100  Ht 5' 3.5" (1.613 m)  Wt 156 lb (70.761 kg)  BMI 27.20 kg/m2  General Appearance:    Alert, cooperative, no distress, appears stated age  Head:    Normocephalic, without obvious abnormality, atraumatic  Eyes:    PERRL, conjunctiva/corneas clear, EOM's intact, fundi    benign  Ears:    Normal TM's and external ear canals  Nose:   Nares normal, mucosa normal, no drainage or sinus   tenderness  Throat:   Lips, mucosa, and tongue normal; teeth and gums normal  Neck:   Supple, no lymphadenopathy;  thyroid:  no   enlargement/tenderness/nodules; no carotid   bruit or JVD  Back:    Spine nontender, no curvature, ROM normal, no CVA     tenderness  Lungs:     Clear to auscultation bilaterally without wheezes, rales or     ronchi; respirations unlabored  Chest Wall:    No tenderness or deformity   Heart:    Regular rate and rhythm, S1 and S2 normal, no murmur, rub   or gallop  Breast Exam:    Deferred to GYN  Abdomen:     Soft, non-tender, nondistended,  normoactive bowel sounds,    no masses, no hepatosplenomegaly  Genitalia:    Deferred to GYN     Extremities:   No clubbing, cyanosis or edema  Pulses:   2+ and symmetric all extremities  Skin:   Skin color, texture, turgor normal, no rashes or lesions  Lymph nodes:   Cervical, supraclavicular, and axillary nodes normal  Neurologic:   CNII-XII intact, normal strength, sensation and gait; reflexes 2+ and symmetric throughout          Psych:   Normal mood, affect, hygiene and grooming.           Assessment & Plan:   1. Hyperlipidemia LDL goal < 100    2. Allergic rhinitis due to pollen    3. Glaucoma    4. Routine general medical examination at a health care facility  Lipid panel, Comprehensive metabolic panel   encouraged her to continue to take good care of herself and stay on present medications. Recheck here as needed.

## 2012-07-27 ENCOUNTER — Other Ambulatory Visit: Payer: Self-pay

## 2012-07-27 NOTE — Progress Notes (Signed)
Quick Note:  Left message on cell # labs looks good ______

## 2012-08-30 ENCOUNTER — Ambulatory Visit (INDEPENDENT_AMBULATORY_CARE_PROVIDER_SITE_OTHER): Payer: 59 | Admitting: Family Medicine

## 2012-08-30 ENCOUNTER — Encounter: Payer: Self-pay | Admitting: Family Medicine

## 2012-08-30 VITALS — BP 160/100 | HR 94 | Temp 98.7°F | Wt 156.0 lb

## 2012-08-30 DIAGNOSIS — J019 Acute sinusitis, unspecified: Secondary | ICD-10-CM

## 2012-08-30 MED ORDER — AMOXICILLIN 875 MG PO TABS: 875.0000 mg | ORAL_TABLET | Freq: Two times a day (BID) | ORAL | Status: DC

## 2012-08-30 NOTE — Progress Notes (Signed)
  Subjective:    Patient ID: Marilyn Dixon, female    DOB: 1951-08-15, 61 y.o.   MRN: [id number]  HPI She started 10 days ago with postnasal drainage and a dry cough followed by nasal congestion. Slight fever and chills. No sinus pressure or upper tooth discomfort, sore throat or earache . She does not smoke. She is getting me to take a Christmas trip to [place] and is concerned about being sick while there.   Review of Systems     Objective:   Physical Exam alert and in no distress. Tympanic membranes and canals are normal. Throat is clear. Tonsils are normal. Neck is supple without adenopathy or thyromegaly. Cardiac exam shows a regular sinus rhythm without murmurs or gallops. Lungs are clear to auscultation. Nasal mucosa is slightly reddish with nontender sinuses        Assessment & Plan:  I think it is prudent to go ahead and treat her since she has a trip pending and does not want to miss it. 1. Acute sinusitis  amoxicillin (AMOXIL) 875 MG tablet

## 2012-09-17 ENCOUNTER — Encounter: Payer: Self-pay | Admitting: Medical

## 2012-09-17 ENCOUNTER — Ambulatory Visit (INDEPENDENT_AMBULATORY_CARE_PROVIDER_SITE_OTHER): Payer: 59 | Admitting: Medical

## 2012-09-17 VITALS — BP 130/82 | HR 96 | Temp 97.6°F | Resp 16 | Wt 155.0 lb

## 2012-09-17 DIAGNOSIS — F4321 Adjustment disorder with depressed mood: Secondary | ICD-10-CM

## 2012-09-17 DIAGNOSIS — R030 Elevated blood-pressure reading, without diagnosis of hypertension: Secondary | ICD-10-CM

## 2012-09-17 NOTE — Progress Notes (Signed)
Subjective: Here for multiple c/o.  Was seen recently for sinus infection.  BP was up them.  She has been checking BPs in general, and still getting elevated readings.  She does not have her home BP cuff with her.   She notes occasional chest pressure, brief, intermittent, over last 3- 4 weeks, no associated with exercise.   Also gets some right arm pain.  Denies recent injury or trauma.  Denies palpations, no SOB, no edema, no specific DOE, but not exercising.  Activity does not seem to give her dyspnea.  No syncope.  No heartburn.    She is under stress.  She notes that this particular December has been difficult.  Her husbands' parent were like her own parents.  Both passed last year.  And this is the first christmas without them.  She has been attending individual counseling through hospice, but is getting ready to change to group therapy once weekly for 8 weeks.    She is concerned about her blood pressure as her father died age 53yo of MI, paternal uncle had MI, maternal uncle had MI, mother hand HTN and TIA.  Past Medical History  Diagnosis Date  . Allergy   . Back pain   . Dyslipidemia   . Glaucoma   . High cholesterol   . Menopause   . Hx of adenomatous colonic polyps    Review of Systems Constitutional: -fever, -chills, -sweats, -unexpected -weight change,-fatigue Cardiology:   -palpitations, -edema Respiratory: -cough, -shortness of breath, -wheezing Gastroenterology: -abdominal pain, -nausea, -vomiting, -diarrhea, -constipation  Ophthalmology: -vision changes Urology: -dysuria, -difficulty urinating, -hematuria, -urinary frequency, -urgency Neurology: -headache, -weakness, -tingling, -numbness     Objective:   Physical Exam  Filed Vitals:   09/17/12 1106  BP: 130/82  Pulse: 96  Temp: 97.6 F (36.4 C)  Resp: 16    General appearance: alert, no distress, WD/WN, white female  Eyes: +mild exophthalmos Neck: supple, no lymphadenopathy, no thyromegaly, no masses, no  bruits Heart: RRR, normal S1, S2, no murmurs Lungs: CTA bilaterally, no wheezes, rhonchi, or rales Abdomen: +bs, soft, non tender, non distended, no masses, no hepatomegaly, no splenomegaly Pulses: 2+ symmetric, upper and lower extremities, normal cap refill Ext: no edema   Adult ECG Report  Indication: chest pressure  Rate: 78bpm  Rhythm: normal sinus rhythm  QRS Axis: 41 degrees  PR Interval:  QRS Duration: 68ms  QTc:  Conduction Disturbances: none  Other Abnormalities: none  Patient's cardiac risk factors are: dyslipidemia and family history of premature cardiovascular disease.  EKG comparison: 03/01/12 no change  Narrative Interpretation: normal EKG    Assessment and Plan :       Encounter Diagnoses  Name Primary?  . Elevated blood pressure reading without diagnosis of hypertension Yes  . Grieving    elevated BP - reviewed labs in chart, EKG.  Advised that looking back over the past year's visits and today's readings, most of our readings are normal.  Her recent home readings are mostly elevated.   I question the accuracy of her machine.  I advised that over the next few weeks to also have her nurse friend check the BP, check BP at drug store, and compare with her machine.  Recheck here in 3-4 wk and bring her BP cuff to compare.  In general avoid added salt, try and lose a little weight, get back to her normal exercise routine, and f/u in 3-4 wk.  Grieving - it seems that her loss and  grieving is a major factor in her overall mental and physical state currently.   Offered SSRI or other medication, she declines.  She is getting support through counseling, and plans to continue this.  She feels that once the holidays have passed, things will get better.

## 2013-02-22 ENCOUNTER — Other Ambulatory Visit: Payer: Self-pay | Admitting: Obstetrics & Gynecology

## 2013-02-22 DIAGNOSIS — Z780 Asymptomatic menopausal state: Secondary | ICD-10-CM

## 2013-03-06 ENCOUNTER — Ambulatory Visit
Admission: RE | Admit: 2013-03-06 | Discharge: 2013-03-06 | Disposition: A | Discharge status: Home / Self Care | Payer: 59 | Source: Ambulatory Visit | Attending: Obstetrics & Gynecology | Admitting: Obstetrics & Gynecology

## 2013-03-06 DIAGNOSIS — Z780 Asymptomatic menopausal state: Secondary | ICD-10-CM

## 2013-03-07 DIAGNOSIS — M8580 Other specified disorders of bone density and structure, unspecified site: Secondary | ICD-10-CM | POA: Insufficient documentation

## 2013-03-07 NOTE — Progress Notes (Signed)
Quick Note:  CALLED PATIENT HOME LEFT WORD FOR WORD MESSAGE Tell her she has low bone mass and recommend vitamin D and calcium and repeat the DEXA scan in 2 years ______

## 2013-05-20 LAB — HM MAMMOGRAPHY: HM Mammogram: NORMAL

## 2013-05-20 LAB — HM DEXA SCAN: HM Dexa Scan: NORMAL

## 2013-07-16 ENCOUNTER — Other Ambulatory Visit: Payer: Self-pay

## 2013-07-16 DIAGNOSIS — Z1231 Encounter for screening mammogram for malignant neoplasm of breast: Secondary | ICD-10-CM

## 2013-07-29 ENCOUNTER — Encounter: Payer: Self-pay | Admitting: Family Medicine

## 2013-07-29 ENCOUNTER — Ambulatory Visit (INDEPENDENT_AMBULATORY_CARE_PROVIDER_SITE_OTHER): Payer: 59 | Admitting: Family Medicine

## 2013-07-29 VITALS — BP 100/60 | HR 86 | Ht 63.5 in | Wt 142.0 lb

## 2013-07-29 DIAGNOSIS — J301 Allergic rhinitis due to pollen: Secondary | ICD-10-CM

## 2013-07-29 DIAGNOSIS — M25519 Pain in unspecified shoulder: Secondary | ICD-10-CM

## 2013-07-29 DIAGNOSIS — M899 Disorder of bone, unspecified: Secondary | ICD-10-CM

## 2013-07-29 DIAGNOSIS — Z23 Encounter for immunization: Secondary | ICD-10-CM

## 2013-07-29 DIAGNOSIS — H409 Unspecified glaucoma: Secondary | ICD-10-CM

## 2013-07-29 DIAGNOSIS — E785 Hyperlipidemia, unspecified: Secondary | ICD-10-CM

## 2013-07-29 DIAGNOSIS — M25512 Pain in left shoulder: Secondary | ICD-10-CM

## 2013-07-29 DIAGNOSIS — M8580 Other specified disorders of bone density and structure, unspecified site: Secondary | ICD-10-CM

## 2013-07-29 NOTE — Progress Notes (Signed)
  Subjective:    Patient ID: Marilyn Dixon, female    DOB: Feb 16, 1951, 62 y.o.   MRN: [id number]  HPI She is here for complete examination. She has had difficulty since August with left trapezius pain. She has been using heat, stretching, massage and exercises all to no avail. No numbness, tingling or weakness. She is followed regularly by her gynecologist. She does have a history of low bone mass. She continues on her statin drug. Her allergies are under good control. She does see her ophthalmologist regularly. She is now retired and she and her husband are now starting an exercise program. She has no other concerns or complaints. Family and social history were reviewed  Review of Systems Negative except as above    Objective:   Physical Exam BP 100/60  Pulse 86  Ht 5' 3.5" (1.613 m)  Wt 142 lb (64.411 kg)  BMI 24.76 kg/m2  General Appearance:    Alert, cooperative, no distress, appears stated age  Head:    Normocephalic, without obvious abnormality, atraumatic  Eyes:    PERRL, conjunctiva/corneas clear, EOM's intact, fundi  Encouraged her to continue to take good care of herself and continue with the exercise program.  benign  Ears:    Normal TM's and external ear canals  Nose:   Nares normal, mucosa normal, no drainage or sinus   tenderness  Throat:   Lips, mucosa, and tongue normal; teeth and gums normal  Neck:   Supple, no lymphadenopathy;  thyroid:  no   enlargement/tenderness/nodules; no carotid   bruit or JVD  Back:    Spine nontender, no curvature, ROM normal, no CVA     tenderness  Lungs:     Clear to auscultation bilaterally without wheezes, rales or     ronchi; respirations unlabored  Chest Wall:    No tenderness or deformity   Heart:    Regular rate and rhythm, S1 and S2 normal, no murmur, rub   or gallop  Breast Exam:    Deferred to GYN  Abdomen:     Soft, non-tender, nondistended, normoactive bowel sounds,    no masses, no hepatosplenomegaly  Genitalia:    Deferred to  GYN     Extremities:   No clubbing, cyanosis or edema  Pulses:   2+ and symmetric all extremities  Skin:   Skin color, texture, turgor normal, no rashes or lesions  Lymph nodes:   Cervical, supraclavicular, and axillary nodes normal  Neurologic:   CNII-XII intact, normal strength, sensation and gait; reflexes 2+ and symmetric throughout          Psych:   Normal mood, affect, hygiene and grooming.          Assessment & Plan:  Trigger point of left shoulder region  Low bone mass  Hyperlipidemia LDL goal < 100  Allergic rhinitis due to pollen  Glaucoma  Need for prophylactic vaccination and inoculation against unspecified single disease - Plan: Pneumococcal conjugate vaccine 13-valent  I encouraged her to continue to take good care of herself and continue with the exercise program.

## 2013-08-02 ENCOUNTER — Telehealth: Payer: Self-pay | Admitting: Family Medicine

## 2013-08-02 ENCOUNTER — Other Ambulatory Visit: Payer: Self-pay

## 2013-08-02 MED ORDER — ATORVASTATIN CALCIUM 10 MG PO TABS: 10.0000 mg | ORAL_TABLET | Freq: Every day | ORAL | Status: DC

## 2013-08-02 NOTE — Telephone Encounter (Signed)
SENT IN LIPID MEDS

## 2013-08-02 NOTE — Telephone Encounter (Signed)
lm

## 2013-08-19 ENCOUNTER — Ambulatory Visit: Payer: 59

## 2013-08-20 ENCOUNTER — Ambulatory Visit
Admission: RE | Admit: 2013-08-20 | Discharge: 2013-08-20 | Disposition: A | Discharge status: Home / Self Care | Payer: 59 | Source: Ambulatory Visit

## 2013-08-20 DIAGNOSIS — Z1231 Encounter for screening mammogram for malignant neoplasm of breast: Secondary | ICD-10-CM

## 2013-10-09 ENCOUNTER — Ambulatory Visit (INDEPENDENT_AMBULATORY_CARE_PROVIDER_SITE_OTHER): Payer: 59 | Admitting: Medical

## 2013-10-09 ENCOUNTER — Encounter: Payer: Self-pay | Admitting: Medical

## 2013-10-09 VITALS — BP 142/84 | HR 85 | Temp 97.8°F | Resp 16 | Ht 64.0 in | Wt 138.0 lb

## 2013-10-09 DIAGNOSIS — G4700 Insomnia, unspecified: Secondary | ICD-10-CM

## 2013-10-09 MED ORDER — ZOLPIDEM TARTRATE 10 MG PO TABS: 10.0000 mg | ORAL_TABLET | Freq: Every evening | ORAL | Status: DC | PRN

## 2013-10-09 NOTE — Progress Notes (Signed)
Subjective:  Marilyn Dixon is a 63 y.o. female who presents for sleep issues.  Onset was 2 months ago. Patient describes symptoms as frequent night time awakening, difficulty falling asleep and non-restful sleep. She retired in august.  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  Lives at home with husband.  No long term problems with insomnia.  Getting few hours of sleep per night.  Has tried Aleve PM.  Has tried hot tea, reading, relaxing.  Associated symptoms include: fatigue and snoring. Patient denies anxiety, daytime somnolence, depression, frequent nighttime urination and restless legs.   Worked for [employer] in [employer]. No other aggravating or relieving factors.    No other c/o.  The following portions of the patient's history were reviewed and updated as appropriate: allergies, current medications, past family history, past medical history, past social history, past surgical history and problem list.  ROS Otherwise as in subjective above    Objective: Physical Exam  Filed Vitals:   10/09/13 1107  BP: 142/84  Pulse: 85  Temp: 97.8 F (36.6 C)  Resp: 16    General appearance: alert, no distress, WD/WN Neck: supple, no lymphadenopathy, no thyromegaly, no masses Heart: RRR, normal S1, S2, no murmurs Lungs: CTA bilaterally, no wheezes, rhonchi, or rales Extremities: no edema, no cyanosis, no clubbing Pulses: 2+ symmetric, upper and lower extremities, normal cap refill Neurological: alert, oriented x 3, CN2-12 intact, strength normal upper extremities and lower extremities, sensation normal throughout, DTRs 2+ throughout, no cerebellar signs, gait normal Psychiatric: normal affect, behavior normal, pleasant    Assessment: Encounter Diagnosis  Name Primary?  . Insomnia Yes    Plan: I believe her insomnia is transient related to her recent retirement and finding activities that occupy her time.  Discussed sleep hygiene measures including regular sleep  schedule, optimal sleep environment, and relaxing pre sleep rituals.  C/t daily exercise.  Discussed stimulus control, avoiding daytime naps, avoiding caffeine after noon, avoiding excess alcohol, avoiding food or drink <2 hours before bedtime.     Discussed medications options, risks, benefits.   Begin trial of Zolpidem for prn short term use.  Follow up: 18mo

## 2013-10-09 NOTE — Patient Instructions (Signed)
Insomnia Insomnia is frequent trouble falling and/or staying asleep. Insomnia can be a long term problem or a short term problem. Both are common. Insomnia can be a short term problem when the wakefulness is related to a certain stress or worry. Long term insomnia is often related to ongoing stress during waking hours and/or poor sleeping habits. Overtime, sleep deprivation itself can make the problem worse. Every little thing feels more severe because you are overtired and your ability to cope is decreased.  CAUSES   Stress, anxiety, and depression.  Poor sleeping habits.  Distractions such as TV in the bedroom.  Naps close to bedtime.  Engaging in emotionally charged conversations before bed.  Technical reading before sleep.  Alcohol and other sedatives. They may make the problem worse. They can hurt normal sleep patterns and normal dream activity.  Stimulants such as caffeine for several hours prior to bedtime.  Pain syndromes and shortness of breath can cause insomnia.  Exercise late at night.  Changing time zones may cause sleeping problems (jet lag).  It is sometimes helpful to have someone observe your sleeping patterns. They should look for periods of not breathing during the night (sleep apnea). They should also look to see how long those periods last. If you live alone or observers are uncertain, you can also be observed at a sleep clinic where your sleep patterns will be professionally monitored. Sleep apnea requires a checkup and treatment. Give your caregivers your medical history. Give your caregivers observations your family has made about your sleep.   SYMPTOMS   Not feeling rested in the morning.  Anxiety and restlessness at bedtime.  Difficulty falling and staying asleep.  TREATMENT   Your caregiver may prescribe treatment for an underlying medical disorders. Your caregiver can give advice or help if you are using alcohol or other drugs for self-medication.  Treatment of underlying problems will usually eliminate insomnia problems.  Medications can be prescribed for short time use. They are generally not recommended for lengthy use.  Over-the-counter sleep medicines are not recommended for lengthy use. They can be habit forming.  You can promote easier sleeping by making lifestyle changes such as the following:  Sleep hygiene  Sleep only as much as you need to feel rested and then get out of bed  Keep a regular sleep schedule.  Aim to go to bed at the same time every night, and set an alarm clock to wake up at a fixed time each morning including weekends  Develop a bedtime ritual. Keep a familiar routine of bathing, brushing your teeth, climbing into bed at the same time each night, listening to soothing music. Routines increase the success of falling to sleep faster.  Use relaxation techniques. This can be using breathing and muscle tension release routines. It can also include visualizing peaceful scenes. You can also help control troubling or intruding thoughts by keeping your mind occupied with boring or repetitive thoughts like the old concept of counting sheep. You can make it more creative like imagining planting one beautiful flower after another in your backyard garden.  During your day, work to eliminate stress. When this is not possible use some of the previous suggestions to help reduce the anxiety that accompanies stressful situations.  Avoid forcing sleep  Exercise regularly at least 20 minutes, preferably 4-5 hours before bedtime  Avoid caffeinated beverages after lunch  Avoid alcohol near bedtime; no "night cap"  Avoid smoking, especially in the evening  Do not go to bed  hungry; work on [employer] and the time of your last meal. No night time snacks.  Adjust bedroom environment to a cool, quiet, dark room  Deal with you worries before bedtime.  Consider counseling for excessive stress, worry, and life situations.   I can provide resources and contact information for counselors if needed.  Stimulus control  Go to bed only when sleepy  Do not watch television, read, eat, or worry while in bed.  Use bed only for sleep and sex  Stop tedious detailed work at least one hour before bedtime.  Get out of the bed if unable to fall asleep within 20 minutes and go to another room.  Return to bed only when sleepy.  Read or do some quiet activity. Keep the lights down. Wait until you feel sleepy and go back to bed.Repeat this step as many times as necessary throughout the night  Do not take a nap during the day   Barnes a diary. Inform your caregiver about your progress. This includes any medication side effects. See your caregiver regularly. Take note of:  Times when you are asleep.  Times when you are awake during the night.  The quality of your sleep.  How you feel the next day.  This information will help your caregiver care for you.  Bring your sleep diary in at the next visit

## 2013-10-17 ENCOUNTER — Ambulatory Visit (INDEPENDENT_AMBULATORY_CARE_PROVIDER_SITE_OTHER): Payer: 59 | Admitting: Family Medicine

## 2013-10-17 VITALS — BP 124/84 | HR 91 | Wt 137.0 lb

## 2013-10-17 DIAGNOSIS — M25519 Pain in unspecified shoulder: Secondary | ICD-10-CM

## 2013-10-17 DIAGNOSIS — M25512 Pain in left shoulder: Secondary | ICD-10-CM

## 2013-10-17 DIAGNOSIS — F338 Other recurrent depressive disorders: Secondary | ICD-10-CM

## 2013-10-17 DIAGNOSIS — F39 Unspecified mood [affective] disorder: Secondary | ICD-10-CM

## 2013-10-17 MED ORDER — CARISOPRODOL 350 MG PO TABS: 350.0000 mg | ORAL_TABLET | Freq: Four times a day (QID) | ORAL | Status: DC | PRN

## 2013-10-17 MED ORDER — SERTRALINE HCL 50 MG PO TABS: 50.0000 mg | ORAL_TABLET | Freq: Every day | ORAL | Status: DC

## 2013-10-17 NOTE — Patient Instructions (Signed)
Use the soma mainly at night and continue with heat and stretching as well as a medication. Take the Zoloft and call me in 2 weeks because I might increase it.

## 2013-10-17 NOTE — Progress Notes (Signed)
   Subjective:    Patient ID: Marilyn Dixon, female    DOB: 28-May-1951, 63 y.o.   MRN: [id number]  HPI She complains of continued difficulty with left neck and shoulder pain. She has been using heat, stretching and acupuncture for this and still no good relief. She is sleeping better since she was given sleep medications. She also is had a great deal of difficulty dealing with sadness. This time of year is always bad for her. It has been made especially bad due to the fact that her mother died during the same timeframe. In the past she was given Zoloft for about of depression and she responded quite nicely to.   Review of Systems     Objective:   Physical Exam Alert and in no distress with appropriate affect. Cardiac exam shows regular rhythm without murmurs or gallops. Lungs are clear to auscultation the       Assessment & Plan:  Seasonal affective disorder - Plan: sertraline (ZOLOFT) 50 MG tablet  Trigger point of left shoulder region - Plan: carisoprodol (SOMA) 350 MG tablet  I will place her on Zoloft. She will call me in 2 weeks and let me know how she is doing and return here in one month. Discussed seasonal affective disorder with her. She understands the concept. I will also give her soma but have her hold off on taking the sleep med at night. Continue with heat and stretching. 25 minutes spent discussing all these issues with her.

## 2013-10-22 ENCOUNTER — Other Ambulatory Visit: Payer: Self-pay | Admitting: Internal Medicine

## 2013-10-22 ENCOUNTER — Encounter: Payer: Self-pay | Admitting: Internal Medicine

## 2013-10-22 ENCOUNTER — Ambulatory Visit
Admission: RE | Admit: 2013-10-22 | Discharge: 2013-10-22 | Disposition: A | Discharge status: Home / Self Care | Payer: 59 | Source: Ambulatory Visit | Attending: Family Medicine | Admitting: Family Medicine

## 2013-10-22 ENCOUNTER — Ambulatory Visit (INDEPENDENT_AMBULATORY_CARE_PROVIDER_SITE_OTHER): Payer: 59 | Admitting: Family Medicine

## 2013-10-22 VITALS — BP 124/86 | HR 78 | Wt 132.0 lb

## 2013-10-22 DIAGNOSIS — R079 Chest pain, unspecified: Secondary | ICD-10-CM

## 2013-10-22 DIAGNOSIS — F39 Unspecified mood [affective] disorder: Secondary | ICD-10-CM

## 2013-10-22 DIAGNOSIS — F338 Other recurrent depressive disorders: Secondary | ICD-10-CM

## 2013-10-22 DIAGNOSIS — R1013 Epigastric pain: Secondary | ICD-10-CM

## 2013-10-22 MED ORDER — HYOSCYAMINE SULFATE ER 0.375 MG PO TB12: 0.3750 mg | ORAL_TABLET | Freq: Two times a day (BID) | ORAL | Status: DC

## 2013-10-22 NOTE — Patient Instructions (Signed)
Take 1-1/2 Zoloft daily. The medicine for the spasm and let me know.See Marya Amsler

## 2013-10-22 NOTE — Progress Notes (Signed)
   Subjective:    Patient ID: Marilyn Dixon, female    DOB: 09-07-1951, 63 y.o.   MRN: [id number]  HPI She has week history of midepigastric pain that tends to wax and wane. She cannot relate this to eating. She has had some slight diarrhea. She continues to have difficulty with sleep. She presently is on Zoloft 50 mg. Of note is the fact is she retired in August. Since then she and her husband traveled to various locations for several months and then went through the holidays. The holidays were also stressful since S1 her mother died. In 10-09-22 she had no major traveling planned. She does have a history of seasonal affective disorder.   Review of Systems     Objective:   Physical Exam Alert and in no distress with a slightly flat affect. Cardiac exam shows regular rhythm without murmurs or gallops. Lungs are clear to auscultation. Slight tenderness to palpation in the xiphoid/midepigastric area. Chest x-ray was negative.      Assessment & Plan:  Midepigastric pain - Plan: hyoscyamine (LEVBID) 0.375 MG 12 hr tablet  Seasonal affective disorder  the pain could possibly be spasm related to low her depression is certainly playing a role in this. She will let me know how she's doing on this. She is to increase her Zoloft to 75 mg. She will also get back involved with counseling with Marya Amsler. Recheck here one month.

## 2013-10-23 ENCOUNTER — Ambulatory Visit (INDEPENDENT_AMBULATORY_CARE_PROVIDER_SITE_OTHER): Payer: 59 | Admitting: Licensed Clinical Social Worker

## 2013-10-23 DIAGNOSIS — F331 Major depressive disorder, recurrent, moderate: Secondary | ICD-10-CM

## 2013-10-25 ENCOUNTER — Telehealth: Payer: Self-pay | Admitting: Family Medicine

## 2013-10-25 NOTE — Telephone Encounter (Signed)
fyi

## 2013-10-29 ENCOUNTER — Other Ambulatory Visit: Payer: Self-pay | Admitting: Family Medicine

## 2013-10-29 ENCOUNTER — Ambulatory Visit (INDEPENDENT_AMBULATORY_CARE_PROVIDER_SITE_OTHER): Payer: 59 | Admitting: Licensed Clinical Social Worker

## 2013-10-29 ENCOUNTER — Telehealth: Payer: Self-pay | Admitting: Family Medicine

## 2013-10-29 DIAGNOSIS — F331 Major depressive disorder, recurrent, moderate: Secondary | ICD-10-CM

## 2013-10-29 MED ORDER — CLONAZEPAM 0.5 MG PO TABS: 0.5000 mg | ORAL_TABLET | Freq: Two times a day (BID) | ORAL | Status: DC | PRN

## 2013-10-29 NOTE — Telephone Encounter (Signed)
Called [facility] regarding Clonezpam rx.

## 2013-10-29 NOTE — Telephone Encounter (Signed)
Raphaela stopped by and ask that we cancel [facility] and send to [facility].  Called [facility] spoke with Marcie Bal [phone] cancelled Clonazepam and then called [facility] [address]

## 2013-11-06 ENCOUNTER — Ambulatory Visit: Payer: 59 | Admitting: Licensed Clinical Social Worker

## 2013-11-07 ENCOUNTER — Telehealth: Payer: Self-pay | Admitting: Family Medicine

## 2013-11-07 ENCOUNTER — Other Ambulatory Visit: Payer: Self-pay | Admitting: Family Medicine

## 2013-11-07 ENCOUNTER — Ambulatory Visit: Payer: 59 | Admitting: Licensed Clinical Social Worker

## 2013-11-07 DIAGNOSIS — F338 Other recurrent depressive disorders: Secondary | ICD-10-CM

## 2013-11-07 MED ORDER — SERTRALINE HCL 50 MG PO TABS: ORAL_TABLET | ORAL | Status: DC

## 2013-11-07 NOTE — Telephone Encounter (Signed)
Pt came in regarding inc dose of Zoloft and needed it sent to [facility] on [address].  We sent to [facility] I called [facility] [phone] t/w Rosalind and cancelled order.

## 2013-11-07 NOTE — Telephone Encounter (Signed)
[facility] [phone] is asking for new rx Sertraline dose was increased from 1 tab 50 mg to 1 1/2 tabs 75 mg per day.  So they need a new rx reflecting the change.

## 2013-11-08 ENCOUNTER — Ambulatory Visit (INDEPENDENT_AMBULATORY_CARE_PROVIDER_SITE_OTHER): Payer: 59 | Admitting: Licensed Clinical Social Worker

## 2013-11-08 DIAGNOSIS — F331 Major depressive disorder, recurrent, moderate: Secondary | ICD-10-CM

## 2013-11-12 ENCOUNTER — Ambulatory Visit: Payer: 59 | Admitting: Medical

## 2013-11-13 ENCOUNTER — Ambulatory Visit: Payer: 59 | Admitting: Licensed Clinical Social Worker

## 2013-11-19 ENCOUNTER — Ambulatory Visit (INDEPENDENT_AMBULATORY_CARE_PROVIDER_SITE_OTHER): Payer: 59 | Admitting: Family Medicine

## 2013-11-19 ENCOUNTER — Encounter: Payer: Self-pay | Admitting: Family Medicine

## 2013-11-19 VITALS — BP 100/70 | HR 72 | Wt 132.0 lb

## 2013-11-19 DIAGNOSIS — G4700 Insomnia, unspecified: Secondary | ICD-10-CM

## 2013-11-19 DIAGNOSIS — F39 Unspecified mood [affective] disorder: Secondary | ICD-10-CM

## 2013-11-19 DIAGNOSIS — F338 Other recurrent depressive disorders: Secondary | ICD-10-CM

## 2013-11-19 MED ORDER — ZOLPIDEM TARTRATE 10 MG PO TABS: 10.0000 mg | ORAL_TABLET | Freq: Every evening | ORAL | Status: DC | PRN

## 2013-11-19 NOTE — Patient Instructions (Signed)
Take the full 75 mg of Zoloft at night and see what that does for the sleep. If it doesn't work then use the Ambien.

## 2013-11-19 NOTE — Progress Notes (Signed)
   Subjective:    Patient ID: Marilyn Dixon, female    DOB: [birth year], 63 y.o.   MRN: [id number]  HPI She is now taking 75 mg of Zoloft however has to spreadout do to it making her feel fatigued. She is to using Klonopin but using that less often and occasionally not at all. She still does need her Ambien for sleep. She continues in counseling and is making good progress.  Review of Systems     Objective:   Physical Exam Alert and in no distress with appropriate affect and dressed appropriately       Assessment & Plan:  Seasonal affective disorder  Insomnia - Plan: zolpidem (AMBIEN) 10 MG tablet  recommend she take the Zoloft at bedtime to see if it will help with sleep and then use Ambien as needed. She will continue in counseling and follow up here in approximately one month. Did suggest that I think she will continue to improve.

## 2013-11-25 ENCOUNTER — Ambulatory Visit (INDEPENDENT_AMBULATORY_CARE_PROVIDER_SITE_OTHER): Payer: 59 | Admitting: Licensed Clinical Social Worker

## 2013-11-25 DIAGNOSIS — F331 Major depressive disorder, recurrent, moderate: Secondary | ICD-10-CM

## 2013-12-09 ENCOUNTER — Telehealth: Payer: Self-pay | Admitting: Family Medicine

## 2013-12-09 MED ORDER — ATORVASTATIN CALCIUM 10 MG PO TABS: 10.0000 mg | ORAL_TABLET | Freq: Every day | ORAL | Status: DC

## 2013-12-09 NOTE — Telephone Encounter (Signed)
Medication sent in. 

## 2013-12-24 ENCOUNTER — Ambulatory Visit: Payer: 59 | Admitting: Family Medicine

## 2013-12-25 ENCOUNTER — Ambulatory Visit: Payer: 59 | Admitting: Family Medicine

## 2013-12-27 ENCOUNTER — Ambulatory Visit (INDEPENDENT_AMBULATORY_CARE_PROVIDER_SITE_OTHER): Payer: 59 | Admitting: Family Medicine

## 2013-12-27 ENCOUNTER — Encounter: Payer: Self-pay | Admitting: Family Medicine

## 2013-12-27 VITALS — BP 132/78 | HR 56 | Ht 62.0 in | Wt 134.0 lb

## 2013-12-27 DIAGNOSIS — F39 Unspecified mood [affective] disorder: Secondary | ICD-10-CM

## 2013-12-27 DIAGNOSIS — G4700 Insomnia, unspecified: Secondary | ICD-10-CM

## 2013-12-27 DIAGNOSIS — F338 Other recurrent depressive disorders: Secondary | ICD-10-CM

## 2013-12-27 MED ORDER — ZOLPIDEM TARTRATE 10 MG PO TABS: 10.0000 mg | ORAL_TABLET | Freq: Every evening | ORAL | Status: DC | PRN

## 2013-12-27 MED ORDER — SERTRALINE HCL 100 MG PO TABS: 100.0000 mg | ORAL_TABLET | Freq: Every day | ORAL | Status: DC

## 2013-12-27 NOTE — Patient Instructions (Signed)

## 2013-12-27 NOTE — Progress Notes (Signed)
   Subjective:    Patient ID: Marilyn Dixon, female    DOB: [birth year], 63 y.o.   MRN: [id number]  HPI He is here for recheck. Wants to know how long to stay on the medication. She states she is feeling much better. She states that she is 75% better but still having difficulty with sleep issues. She continues in counseling and is making good insight into the underlying issues.   Review of Systems     Objective:   Physical Exam Alert and in no distress with appropriate affect.       Assessment & Plan:  Seasonal affective disorder - Plan: sertraline (ZOLOFT) 100 MG tablet  Insomnia - Plan: zolpidem (AMBIEN) 10 MG tablet  I explained that I will increase her to 100 mg since she's really not back to her normal self. I will also give her some Ambien. Explained she will probably need to use this less and less as a Zoloft kicks in. She will continue to seek counseling. She will followup here within the next several months.

## 2014-01-01 ENCOUNTER — Ambulatory Visit (INDEPENDENT_AMBULATORY_CARE_PROVIDER_SITE_OTHER): Payer: 59 | Admitting: Licensed Clinical Social Worker

## 2014-01-01 DIAGNOSIS — F331 Major depressive disorder, recurrent, moderate: Secondary | ICD-10-CM

## 2014-02-25 ENCOUNTER — Encounter: Payer: Self-pay | Admitting: Family Medicine

## 2014-02-25 ENCOUNTER — Ambulatory Visit (INDEPENDENT_AMBULATORY_CARE_PROVIDER_SITE_OTHER): Payer: 59 | Admitting: Family Medicine

## 2014-02-25 ENCOUNTER — Other Ambulatory Visit: Payer: Self-pay | Admitting: Family Medicine

## 2014-02-25 VITALS — BP 110/70 | HR 72 | Wt 138.0 lb

## 2014-02-25 DIAGNOSIS — F39 Unspecified mood [affective] disorder: Secondary | ICD-10-CM

## 2014-02-25 DIAGNOSIS — F3340 Major depressive disorder, recurrent, in remission, unspecified: Secondary | ICD-10-CM

## 2014-02-25 DIAGNOSIS — R1013 Epigastric pain: Secondary | ICD-10-CM

## 2014-02-25 DIAGNOSIS — F411 Generalized anxiety disorder: Secondary | ICD-10-CM

## 2014-02-25 DIAGNOSIS — G4700 Insomnia, unspecified: Secondary | ICD-10-CM

## 2014-02-25 DIAGNOSIS — F419 Anxiety disorder, unspecified: Secondary | ICD-10-CM

## 2014-02-25 MED ORDER — HYOSCYAMINE SULFATE ER 0.375 MG PO TB12: 0.3750 mg | ORAL_TABLET | Freq: Two times a day (BID) | ORAL | Status: DC

## 2014-02-25 MED ORDER — CLONAZEPAM 0.5 MG PO TABS: 0.5000 mg | ORAL_TABLET | Freq: Two times a day (BID) | ORAL | Status: DC | PRN

## 2014-02-25 MED ORDER — ZOLPIDEM TARTRATE 10 MG PO TABS: 10.0000 mg | ORAL_TABLET | Freq: Every evening | ORAL | Status: DC | PRN

## 2014-02-25 NOTE — Progress Notes (Signed)
   Subjective:    Patient ID: Marilyn Dixon, female    DOB: [birth year], 63 y.o.   MRN: [id number]  HPI She is here for a recheck. She is doing quite nicely with her seasonal affective disorder and plans to stay on the Zoloft. She and her husband are getting ready for a car trip to [place]. She would like to have several of her medications renewed to be on the safe side. They include Levbid, Klonopin and her Ambien.   Review of Systems     Objective:   Physical Exam Alert and in no distress  with appropriate affect       Assessment & Plan:  Midepigastric pain - Plan: hyoscyamine (LEVBID) 0.375 MG 12 hr tablet  Insomnia - Plan: zolpidem (AMBIEN) 10 MG tablet  Anxiety - Plan: clonazePAM (KLONOPIN) 0.5 MG tablet  Seasonal affective disorder in remission  she has a very good approach towards how to handle her present situation. Think it's appropriate to give her these medications to help in case she is stuck on the [facility] [place]. Also discussed proper security measures.

## 2014-02-26 ENCOUNTER — Ambulatory Visit (INDEPENDENT_AMBULATORY_CARE_PROVIDER_SITE_OTHER): Payer: 59 | Admitting: Licensed Clinical Social Worker

## 2014-02-26 DIAGNOSIS — F331 Major depressive disorder, recurrent, moderate: Secondary | ICD-10-CM

## 2014-05-13 ENCOUNTER — Ambulatory Visit: Payer: 59 | Admitting: Family Medicine

## 2014-05-20 ENCOUNTER — Encounter: Payer: Self-pay | Admitting: Family Medicine

## 2014-05-20 ENCOUNTER — Ambulatory Visit (INDEPENDENT_AMBULATORY_CARE_PROVIDER_SITE_OTHER): Payer: 59 | Admitting: Family Medicine

## 2014-05-20 VITALS — BP 122/78 | HR 64 | Wt 146.0 lb

## 2014-05-20 DIAGNOSIS — Z23 Encounter for immunization: Secondary | ICD-10-CM

## 2014-05-20 DIAGNOSIS — F338 Other recurrent depressive disorders: Secondary | ICD-10-CM

## 2014-05-20 DIAGNOSIS — F39 Unspecified mood [affective] disorder: Secondary | ICD-10-CM

## 2014-05-20 DIAGNOSIS — F4322 Adjustment disorder with anxiety: Secondary | ICD-10-CM

## 2014-05-20 LAB — HM PAP SMEAR: HM Pap smear: NORMAL

## 2014-05-20 MED ORDER — SERTRALINE HCL 100 MG PO TABS: 100.0000 mg | ORAL_TABLET | Freq: Every day | ORAL | Status: DC

## 2014-05-20 NOTE — Progress Notes (Signed)
   Subjective:    Patient ID: Marilyn Dixon, female    DOB: 05-May-1951, 63 y.o.   MRN: [id number]  HPI She is here for a recheck. She recently returned from a trip to [place]. She did fairly well but did have occasional episodes of anxiety however handled fairly well. She is now becoming anxious over an impending trip to [place]. She continues in counseling with a therapist. At present she seems less what to be interested in stopping her medication.   Review of Systems     Objective:   Physical Exam Alert and in no distress otherwise not examined       Assessment & Plan:  Need for prophylactic vaccination and inoculation against influenza - Plan: Flu Vaccine QUAD 36+ mos IM  Seasonal affective disorder - Plan: sertraline (ZOLOFT) 100 MG tablet  Reaction, adjustment, with anxious mood - Plan: sertraline (ZOLOFT) 100 MG tablet  I will continue her on the Zoloft. Discussed the impending trip and having a much more positive attitude towards this. Also discussed use of the serenity prayer. She seems to be investing too much and trying to make her sister-in-law a friend which apparently will not work. Gave her the analogy of the fact she cannot continue to investing something when there is no return on that assessment .

## 2014-06-24 ENCOUNTER — Other Ambulatory Visit: Payer: Self-pay

## 2014-06-24 ENCOUNTER — Telehealth: Payer: Self-pay | Admitting: Family Medicine

## 2014-06-24 MED ORDER — ATORVASTATIN CALCIUM 10 MG PO TABS: 10.0000 mg | ORAL_TABLET | Freq: Every day | ORAL | Status: DC

## 2014-06-24 NOTE — Telephone Encounter (Signed)
DONE

## 2014-06-24 NOTE — Telephone Encounter (Signed)
Pt stopped by and stated that she needs a refill on lipitor. Pt has cpe on Nov. 30 but needs medication until that appt. [facility] into [facility] on [address].

## 2014-07-02 ENCOUNTER — Telehealth: Payer: Self-pay | Admitting: Family Medicine

## 2014-07-02 ENCOUNTER — Ambulatory Visit
Admission: RE | Admit: 2014-07-02 | Discharge: 2014-07-02 | Disposition: A | Discharge status: Home / Self Care | Payer: 59 | Source: Ambulatory Visit | Attending: Family Medicine | Admitting: Family Medicine

## 2014-07-02 ENCOUNTER — Ambulatory Visit (INDEPENDENT_AMBULATORY_CARE_PROVIDER_SITE_OTHER): Payer: 59 | Admitting: Family Medicine

## 2014-07-02 ENCOUNTER — Encounter: Payer: Self-pay | Admitting: Family Medicine

## 2014-07-02 VITALS — BP 140/80 | HR 84 | Ht 63.5 in | Wt 142.0 lb

## 2014-07-02 DIAGNOSIS — M25571 Pain in right ankle and joints of right foot: Secondary | ICD-10-CM

## 2014-07-02 DIAGNOSIS — F338 Other recurrent depressive disorders: Secondary | ICD-10-CM

## 2014-07-02 DIAGNOSIS — F4322 Adjustment disorder with anxiety: Secondary | ICD-10-CM

## 2014-07-02 MED ORDER — SERTRALINE HCL 100 MG PO TABS: 100.0000 mg | ORAL_TABLET | Freq: Every day | ORAL | Status: DC

## 2014-07-02 NOTE — Patient Instructions (Signed)
Go to [facility] for x-rays.  Given the bony tenderness on your exam, I'd like to rule out any fracture or bony injury. If x-ray is abnormal, we will refer you to orthopedist.  If x-ray is normal, then treat as a sprain. Continue support (brace), ice, elevation. You can use anti-inflammatories such as Aleve or ibuprofen.  Ankle Sprain An ankle sprain is an injury to the strong, fibrous tissues (ligaments) that hold the bones of your ankle joint together.  CAUSES An ankle sprain is usually caused by a fall or by twisting your ankle. Ankle sprains most commonly occur when you step on the outer edge of your foot, and your ankle turns inward. People who participate in sports are more prone to these types of injuries.  SYMPTOMS   Pain in your ankle. The pain may be present at rest or only when you are trying to stand or walk.  Swelling.  Bruising. Bruising may develop immediately or within 1 to 2 days after your injury.  Difficulty standing or walking, particularly when turning corners or changing directions. DIAGNOSIS  Your caregiver will ask you details about your injury and perform a physical exam of your ankle to determine if you have an ankle sprain. During the physical exam, your caregiver will press on and apply pressure to specific areas of your foot and ankle. Your caregiver will try to move your ankle in certain ways. An X-ray exam may be done to be sure a bone was not broken or a ligament did not separate from one of the bones in your ankle (avulsion fracture).  TREATMENT  Certain types of braces can help stabilize your ankle. Your caregiver can make a recommendation for this. Your caregiver may recommend the use of medicine for pain. If your sprain is severe, your caregiver may refer you to a surgeon who helps to restore function to parts of your skeletal system (orthopedist) or a physical therapist. [place] ice to your injury for 1-2 days or as directed  by your caregiver. Applying ice helps to reduce inflammation and pain.  Put ice in a plastic bag.  Place a towel between your skin and the bag.  Leave the ice on for 15-20 minutes at a time, every 2 hours while you are awake.  Only take over-the-counter or prescription medicines for pain, discomfort, or fever as directed by your caregiver.  Elevate your injured ankle above the level of your heart as much as possible for 2-3 days.  If your caregiver recommends crutches, use them as instructed. Gradually put weight on the affected ankle. Continue to use crutches or a cane until you can walk without feeling pain in your ankle.  If you have a plaster splint, wear the splint as directed by your caregiver. Do not rest it on anything harder than a pillow for the first 24 hours. Do not put weight on it. Do not get it wet. You may take it off to take a shower or bath.  You may have been given an elastic bandage to wear around your ankle to provide support. If the elastic bandage is too tight (you have numbness or tingling in your foot or your foot becomes cold and blue), adjust the bandage to make it comfortable.  If you have an air splint, you may blow more air into it or let air out to make it more comfortable. You may take your splint off at night and before taking a shower or bath.  Wiggle your toes in the splint several times per day to decrease swelling. SEEK MEDICAL CARE IF:   You have rapidly increasing bruising or swelling.  Your toes feel extremely cold or you lose feeling in your foot.  Your pain is not relieved with medicine. SEEK IMMEDIATE MEDICAL CARE IF:  Your toes are numb or blue.  You have severe pain that is increasing. MAKE SURE YOU:   Understand these instructions.  Will watch your condition.  Will get help right away if you are not doing well or get worse. Document Released: 09/05/2005 Document Revised: 05/30/2012 Document Reviewed: 09/17/2011 [facility] Patient  Information 2015 [place], [place]. This information is not intended to replace advice given to you by your health care provider. Make sure you discuss any questions you have with your health care provider.

## 2014-07-02 NOTE — Progress Notes (Signed)
Chief Complaint  Patient presents with  . Fall    fell while getting out of her car 06/26/14, injured her right ankle. Has been icing, using EPSOM salt lotion and her ankle brace.    06/26/14, she tripped while getting out of her car.  She twisted her right ankle, and landed on her cement garage floor.  She was able to walk on it after the fall.  She had some swelling.  She has been icing and keeping it elevated.  H/o prior ankle injury 4-5 years ago. She had cane and ankle brace at home from that injury, and has been using it.  She has also been using an Epsom salt lotion.  Her ankle is feeling significantly better.  She is concerned, as she is leaving 10/27 for a trip to [facility] and she is still having some swelling, bruising and pain.  Past Medical History  Diagnosis Date  . Allergy   . Back pain   . Dyslipidemia   . Glaucoma   . High cholesterol   . Menopause   . Hx of adenomatous colonic polyps   . Seasonal affective disorder    Past Surgical History  Procedure Laterality Date  . Colonoscopy  2006/2011    [place]   History   Social History  . Marital Status: Married    Spouse Name: N/A    Number of Children: N/A  . Years of Education: N/A   Occupational History  . Not on file.   Social History Main Topics  . Smoking status: Never Smoker   . Smokeless tobacco: Never Used  . Alcohol Use: 1.5 oz/week    3 drink(s) per week  . Drug Use: No  . Sexual Activity: Yes   Other Topics Concern  . Not on file   Social History Narrative  . No narrative on file    Outpatient Encounter Prescriptions as of 07/02/2014  Medication Sig Note  . aspirin 81 MG chewable tablet Chew 81 mg by mouth daily.   [name] Kitchen atorvastatin (LIPITOR) 10 MG tablet Take 1 tablet (10 mg total) by mouth daily.   . cholecalciferol (VITAMIN D) 1000 UNITS tablet Take 1,000 Units by mouth 2 (two) times daily.    . clonazePAM (KLONOPIN) 0.5 MG tablet Take 1 tablet (0.5 mg  total) by mouth 2 (two) times daily as needed for anxiety.   [name] Kitchen estradiol (VIVELLE-DOT) 0.0375 MG/24HR Place 1 patch onto the skin 2 (two) times a week.     . Hormone Cream Base CREA by Does not apply route.   . Multiple Vitamin (MULTIVITAMIN WITH MINERALS) TABS Take 1 tablet by mouth daily.   . progesterone (PROMETRIUM) 100 MG capsule Take 100 mg by mouth daily. THE 1ST THRU THE 10TH DAY OF EACH MONTH   . sertraline (ZOLOFT) 100 MG tablet Take 1 tablet (100 mg total) by mouth daily.   . carisoprodol (SOMA) 350 MG tablet Take 1 tablet (350 mg total) by mouth 4 (four) times daily as needed for muscle spasms. 07/02/2014: Not using  . hyoscyamine (LEVBID) 0.375 MG 12 hr tablet Take 1 tablet (0.375 mg total) by mouth 2 (two) times daily. 07/02/2014: Not taking  . zolpidem (AMBIEN) 10 MG tablet Take 1 tablet (10 mg total) by mouth at bedtime as needed for sleep.    Allergies  Allergen Reactions  . Sulfa Antibiotics     rash   ROS:  No fever, chills, numbness, tingling, skin lesions/sores, bleeding.  +bruising at right ankle/foot.  No GI complaints. No chest pain, URI symptoms, shortness of breath or other concerns  PHYSICAL EXAM: BP 140/80  Pulse 84  Ht 5' 3.5" (1.613 m)  Wt 142 lb (64.411 kg)  BMI 24.76 kg/m2   Well developed, pleasant female in no distress There is swelling laterally at right ankle.  She has ecchymosis (yellowing and purple) starting at the distal fibula, and across the anterior lower leg; no swelling in this location.  There is tenderness at the lower fibular and the lateral malleolus. Ecchymosis also noted at the lateral foot, as well as the medial foot at the arch.  There is no tendernes over these locations.  These bruised areas are also where the ankle brace sits. She is tender over the lateral malleolus, and ATFL. Negative drawer.  nontender elsewhere at ankle. 2+ pulses, brisk cap refill  ASSESSMENT/PLAN:  Right ankle pain - Plan: DG Ankle Complete  Right   Suspect sprain, rather than fracture (given ability to bear weight, improvement shown thus far).  However, given amount of swelling/bruising and ongoing bony tenderness, as well as her upcoming travel ,will check x-ray to exclude any fracture.  If x-ray is negative, then continue brace (although perhaps it was put on too tight at some point, contributing to bruising), icing, elevation.  Shown alphabet exercises.  Advance activity as tolerated.  Handout given.  If any e/o fracture, will refer to ortho.

## 2014-07-02 NOTE — Telephone Encounter (Signed)
Pt needs refill on zoloft has a appt scheduled.

## 2014-08-18 ENCOUNTER — Encounter: Payer: Self-pay | Admitting: Family Medicine

## 2014-08-18 ENCOUNTER — Ambulatory Visit (INDEPENDENT_AMBULATORY_CARE_PROVIDER_SITE_OTHER): Payer: 59 | Admitting: Family Medicine

## 2014-08-18 VITALS — BP 110/70 | HR 82 | Ht 63.5 in | Wt 149.0 lb

## 2014-08-18 DIAGNOSIS — M8580 Other specified disorders of bone density and structure, unspecified site: Secondary | ICD-10-CM

## 2014-08-18 DIAGNOSIS — G4700 Insomnia, unspecified: Secondary | ICD-10-CM

## 2014-08-18 DIAGNOSIS — F338 Other recurrent depressive disorders: Secondary | ICD-10-CM

## 2014-08-18 DIAGNOSIS — E785 Hyperlipidemia, unspecified: Secondary | ICD-10-CM

## 2014-08-18 DIAGNOSIS — D126 Benign neoplasm of colon, unspecified: Secondary | ICD-10-CM

## 2014-08-18 DIAGNOSIS — J301 Allergic rhinitis due to pollen: Secondary | ICD-10-CM

## 2014-08-18 DIAGNOSIS — F39 Unspecified mood [affective] disorder: Secondary | ICD-10-CM

## 2014-08-18 DIAGNOSIS — M62830 Muscle spasm of back: Secondary | ICD-10-CM

## 2014-08-18 DIAGNOSIS — H409 Unspecified glaucoma: Secondary | ICD-10-CM

## 2014-08-18 DIAGNOSIS — Z0000 Encounter for general adult medical examination without abnormal findings: Secondary | ICD-10-CM

## 2014-08-18 LAB — COMPREHENSIVE METABOLIC PANEL
ALT: 31 U/L (ref 0–35)
AST: 25 U/L (ref 0–37)
Albumin: 4.4 g/dL (ref 3.5–5.2)
Alkaline Phosphatase: 124 U/L — ABNORMAL HIGH (ref 39–117)
BUN: 12 mg/dL (ref 6–23)
CO2: 26 meq/L (ref 19–32)
Calcium: 9.7 mg/dL (ref 8.4–10.5)
Chloride: 104 mEq/L (ref 96–112)
Creat: 0.76 mg/dL (ref 0.50–1.10)
GLUCOSE: 92 mg/dL (ref 70–99)
POTASSIUM: 4.4 meq/L (ref 3.5–5.3)
SODIUM: 141 meq/L (ref 135–145)
TOTAL PROTEIN: 7 g/dL (ref 6.0–8.3)
Total Bilirubin: 0.7 mg/dL (ref 0.2–1.2)

## 2014-08-18 LAB — CBC WITH DIFFERENTIAL/PLATELET
BASOS PCT: 0 % (ref 0–1)
Basophils Absolute: 0 10*3/uL (ref 0.0–0.1)
Eosinophils Absolute: 0.2 10*3/uL (ref 0.0–0.7)
Eosinophils Relative: 3 % (ref 0–5)
HEMATOCRIT: 45.7 % (ref 36.0–46.0)
HEMOGLOBIN: 16 g/dL — AB (ref 12.0–15.0)
LYMPHS PCT: 21 % (ref 12–46)
Lymphs Abs: 1.3 10*3/uL (ref 0.7–4.0)
MCH: 31.3 pg (ref 26.0–34.0)
MCHC: 35 g/dL (ref 30.0–36.0)
MCV: 89.4 fL (ref 78.0–100.0)
MONOS PCT: 8 % (ref 3–12)
MPV: 10.1 fL (ref 9.4–12.4)
Monocytes Absolute: 0.5 10*3/uL (ref 0.1–1.0)
NEUTROS ABS: 4.1 10*3/uL (ref 1.7–7.7)
Neutrophils Relative %: 68 % (ref 43–77)
Platelets: 259 10*3/uL (ref 150–400)
RBC: 5.11 MIL/uL (ref 3.87–5.11)
RDW: 13.5 % (ref 11.5–15.5)
WBC: 6.1 10*3/uL (ref 4.0–10.5)

## 2014-08-18 LAB — POCT URINALYSIS DIPSTICK
BILIRUBIN UA: NEGATIVE
Glucose, UA: NEGATIVE
Ketones, UA: NEGATIVE
Leukocytes, UA: NEGATIVE
Nitrite, UA: NEGATIVE
Protein, UA: NEGATIVE
RBC UA: NEGATIVE
SPEC GRAV UA: 1.02
Urobilinogen, UA: NEGATIVE
pH, UA: 6

## 2014-08-18 LAB — LIPID PANEL
CHOLESTEROL: 231 mg/dL — AB (ref 0–200)
HDL: 69 mg/dL (ref >39)
LDL Cholesterol: 141 mg/dL — ABNORMAL HIGH (ref 0–99)
Total CHOL/HDL Ratio: 3.3 Ratio
Triglycerides: 105 mg/dL (ref <150)
VLDL: 21 mg/dL (ref 0–40)

## 2014-08-18 MED ORDER — ATORVASTATIN CALCIUM 10 MG PO TABS: 10.0000 mg | ORAL_TABLET | Freq: Every day | ORAL | Status: DC

## 2014-08-18 NOTE — Progress Notes (Signed)
Subjective:     Patient ID: Marilyn Dixon, female   DOB: Jul 16, 1951, 63 y.o.   MRN: [id number]  HPI  This is a 63 year old female with a history of osteopenia, hyperlipidemia, glaucoma, and seasonal affective disorder who presents for an annual comprehensive physical exam.  She was seen here in October for a trip with painful swelling of the ankle, but an x-ray at that time was normal.  She has no current ankle pain.  She began to experience significant depressive symptoms two years ago after the death of her husband's parents.  In conjunction with this she also retired which was a stressful change.  Since that time she has started taking sertraline, working with a therapist, and exercising regularly; she describes her mood as greatly improved.  She is currently following with her ophthalmologist for her glaucoma.  She is currently due for mammography but up to date on her colonoscopy (needed 2016) and pap smear.  She has received her annual flu, pneumo 13 x2, TDaP <10 yr ago, and herpes zoster vaccinations.    Her family history is significant for a fatal MI in her father at 76 and multiple strokes in her mother.  She enjoys traveling with her husband and just returned from a trip to [place].  She is also enjoying a new volunteer position.  Review of Systems  Constitutional: Negative.   HENT: Negative.   Eyes: Negative.   Respiratory: Negative.   Cardiovascular: Negative.   Gastrointestinal: Negative.   Endocrine: Negative.   Genitourinary: Negative.   Allergic/Immunologic: Negative.   Neurological: Negative.   Hematological: Negative.        Objective:   Physical Exam  BP 110/70 mmHg  Pulse 82  Ht 5' 3.5" (1.613 m)  Wt 149 lb (67.586 kg)  BMI 25.98 kg/m2  SpO2 98%  General Appearance:    Alert, cooperative, no distress, appears stated age  Head:    Normocephalic, without obvious abnormality, atraumatic  Eyes:    PERRL, conjunctiva/corneas clear, EOM's  intact, fundi    benign, both eyes  Ears:    Normal TM's and external ear canals, both ears  Nose:   Nares normal, septum midline, mucosa normal, no drainage    or sinus tenderness  Throat:   Lips, mucosa, and tongue normal; teeth and gums normal  Neck:   Supple, symmetrical, trachea midline, no adenopathy;    thyroid:  no enlargement/tenderness/nodules; no carotid   bruit or JVD  Back:     Symmetric, no curvature, ROM normal, no CVA tenderness  Lungs:     Clear to auscultation bilaterally, respirations unlabored  Chest Wall:    No tenderness or deformity   Heart:    Regular rate and rhythm, S1 and S2 normal, no murmur, rub   or gallop  Breast Exam:    No tenderness, masses, or nipple abnormality  Abdomen:     Soft, non-tender, bowel sounds active all four quadrants,    no masses, no organomegaly        Extremities:   Swelling over R lateral malleolus without minimal erythema, no tenderness.  Otherwise normal in all extremities.  Pulses:   2+ and symmetric all extremities  Skin:   Skin color, texture, turgor normal, no rashes or lesions  Lymph nodes:   Cervical, supraclavicular, and axillary nodes normal      Fasting lipids 2013: LDL 105 otherwise wnl Pap smear 05/2014 Normal Colonoscopy 2011 with adenomatous polyp Normal EKG 2013  Assessment: & Plan     Routine general medical examination at a health care facility - Plan: POCT Urinalysis Dipstick, CBC with Differential, Comprehensive metabolic panel, Lipid panel  Seasonal affective disorder - Continue with exercise, therapy, and setraline with use of clonazepam for breakthrough anxiety.  Glaucoma - Follow-up as planned with ophtalmologist.  Insomnia - Currently requiring ambien 1x monthly.  Continue good sleep hygiene.  Low bone mass - Continue with Vit. D supplementation. - Plan: CBC with Differential, Comprehensive metabolic panel  Hyperlipidemia LDL goal <100 - Fasting lipids today. Continue with atorvastatin 10 mg  daily. - Plan: atorvastatin (LIPITOR) 10 MG tablet, Lipid panel  Spasm of back muscles - Continue soma PRN.  Currently taking <1x/month.  Allergic rhinitis due to pollen  Adenomatous polyp of colon  She will schedule her biennial mammography herself.  Otherwise her health maintenance screenings and immunizations are up to date.

## 2014-08-18 NOTE — Progress Notes (Deleted)
   Subjective:    Patient ID: Marilyn Dixon, female    DOB: 06-10-1951, 63 y.o.   MRN: [id number]  HPI    Review of Systems     Objective:   Physical Exam        Assessment & Plan:

## 2014-08-22 ENCOUNTER — Other Ambulatory Visit: Payer: Self-pay

## 2014-08-22 DIAGNOSIS — Z1231 Encounter for screening mammogram for malignant neoplasm of breast: Secondary | ICD-10-CM

## 2014-08-28 ENCOUNTER — Ambulatory Visit (INDEPENDENT_AMBULATORY_CARE_PROVIDER_SITE_OTHER): Payer: 59 | Admitting: Licensed Clinical Social Worker

## 2014-08-28 DIAGNOSIS — F332 Major depressive disorder, recurrent severe without psychotic features: Secondary | ICD-10-CM

## 2014-09-09 ENCOUNTER — Ambulatory Visit
Admission: RE | Admit: 2014-09-09 | Discharge: 2014-09-09 | Disposition: A | Discharge status: Home / Self Care | Payer: 59 | Source: Ambulatory Visit

## 2014-09-09 ENCOUNTER — Other Ambulatory Visit: Payer: Self-pay

## 2014-09-09 DIAGNOSIS — Z1231 Encounter for screening mammogram for malignant neoplasm of breast: Secondary | ICD-10-CM

## 2014-09-24 ENCOUNTER — Ambulatory Visit (INDEPENDENT_AMBULATORY_CARE_PROVIDER_SITE_OTHER): Payer: 59 | Admitting: Licensed Clinical Social Worker

## 2014-09-24 DIAGNOSIS — F332 Major depressive disorder, recurrent severe without psychotic features: Secondary | ICD-10-CM

## 2014-10-06 ENCOUNTER — Ambulatory Visit (INDEPENDENT_AMBULATORY_CARE_PROVIDER_SITE_OTHER): Payer: 59 | Admitting: Licensed Clinical Social Worker

## 2014-10-06 DIAGNOSIS — F332 Major depressive disorder, recurrent severe without psychotic features: Secondary | ICD-10-CM

## 2014-10-27 ENCOUNTER — Ambulatory Visit (INDEPENDENT_AMBULATORY_CARE_PROVIDER_SITE_OTHER): Payer: 59 | Admitting: Licensed Clinical Social Worker

## 2014-10-27 DIAGNOSIS — F332 Major depressive disorder, recurrent severe without psychotic features: Secondary | ICD-10-CM

## 2015-01-14 ENCOUNTER — Encounter: Payer: Self-pay | Admitting: Family Medicine

## 2015-01-14 ENCOUNTER — Ambulatory Visit (INDEPENDENT_AMBULATORY_CARE_PROVIDER_SITE_OTHER): Payer: 59 | Admitting: Family Medicine

## 2015-01-14 VITALS — BP 118/72 | HR 80 | Wt 145.2 lb

## 2015-01-14 DIAGNOSIS — E785 Hyperlipidemia, unspecified: Secondary | ICD-10-CM

## 2015-01-14 DIAGNOSIS — J301 Allergic rhinitis due to pollen: Secondary | ICD-10-CM | POA: Diagnosis not present

## 2015-01-14 DIAGNOSIS — F338 Other recurrent depressive disorders: Secondary | ICD-10-CM

## 2015-01-14 DIAGNOSIS — F39 Unspecified mood [affective] disorder: Secondary | ICD-10-CM

## 2015-01-14 MED ORDER — ATORVASTATIN CALCIUM 10 MG PO TABS: 10.0000 mg | ORAL_TABLET | Freq: Every day | ORAL | Status: DC

## 2015-01-14 NOTE — Patient Instructions (Signed)
Try Flonase for the cough.You might also want to consider trying Prilosec especially for the nighttime coughing.

## 2015-01-14 NOTE — Progress Notes (Signed)
   Subjective:    Patient ID: Marilyn Dixon, female    DOB: June 08, 1951, 64 y.o.   MRN: [id number]  HPI She does have seasonal allergies that started in late March. She complains of difficulty with itchy watery eyes, nasal congestion, dry cough with PND. She is on Zyrtec. Her main concern is a cough that occurs mainly at night. She does have a history of seasonal affective disorder and did have difficulty January and February. She seems to be stable on her present medication regimen. She has discussed this with her therapist and at this time does not have plans to stop it. She also needs her Lipitor refill.   Review of Systems     Objective:   Physical Exam Alert and in no distress. Tympanic membranes and canals are normal. Pharyngeal area is normal. Neck is supple without adenopathy or thyromegaly. Cardiac exam shows a regular sinus rhythm without murmurs or gallops. Lungs are clear to auscultation.        Assessment & Plan:  Hyperlipidemia LDL goal <100 - Plan: atorvastatin (LIPITOR) 10 MG tablet  Seasonal affective disorder  Allergic rhinitis due to pollen Recommend she add Flonase to her regimen to see if this will help with the cough. Also recommended possibly using Prilosec especially with the nighttime coughing. She will continue on her present medication regimen in regard to her dysthymia and seasonal affective disorder

## 2015-05-08 ENCOUNTER — Ambulatory Visit (INDEPENDENT_AMBULATORY_CARE_PROVIDER_SITE_OTHER): Payer: Medicare HMO | Admitting: Family Medicine

## 2015-05-08 ENCOUNTER — Encounter: Payer: Self-pay | Admitting: Family Medicine

## 2015-05-08 VITALS — BP 102/60 | HR 73 | Wt 143.0 lb

## 2015-05-08 DIAGNOSIS — F4322 Adjustment disorder with anxiety: Secondary | ICD-10-CM | POA: Diagnosis not present

## 2015-05-08 DIAGNOSIS — R1010 Upper abdominal pain, unspecified: Secondary | ICD-10-CM | POA: Diagnosis not present

## 2015-05-08 DIAGNOSIS — F39 Unspecified mood [affective] disorder: Secondary | ICD-10-CM

## 2015-05-08 DIAGNOSIS — F338 Other recurrent depressive disorders: Secondary | ICD-10-CM

## 2015-05-08 MED ORDER — SERTRALINE HCL 100 MG PO TABS: 100.0000 mg | ORAL_TABLET | Freq: Every day | ORAL | Status: DC

## 2015-05-08 NOTE — Progress Notes (Signed)
   Subjective:    Patient ID: Marilyn Dixon, female    DOB: 10/03/1950, 64 y.o.   MRN: [id number]  HPI She complains of a month-long history of dull mid abdominal and midepigastric pain that is intermittent in nature and associated with anorexia. She has had one loose stool periodically during the same timeframe but no gas, cramping, nausea or vomiting. She also notes a slight increase in hot flashes and presently she is on estrogen. Prior to this she was on a vacation in [place] but was not drinking from well water or any other exposure. She would also like her Zoloft renewed. She is doing quite well on this and at the present time has no desire to stop.   Review of Systems     Objective:   Physical Exam Alert and in no distress with appropriate affect. Abdominal exam shows active bowel sounds without masses or tenderness. Cardiac exam is normal.       Assessment & Plan:  Pain of upper abdomen  Seasonal affective disorder - Plan: sertraline (ZOLOFT) 100 MG tablet  Reaction, adjustment, with anxious mood - Plan: sertraline (ZOLOFT) 100 MG tablet Did recommend trying Prilosec 40 mg daily for the next 1-2 weeks and see if this will help. If no improvement, I will reevaluate. I will also continue her on her sertraline as she is doing quite well on this. We may consider stopping this in approximately one year. Approximately 25 minutes, greater than 50% spent in counseling and coordination of care.

## 2015-05-08 NOTE — Patient Instructions (Signed)
Take 40 mg of Prilosec daily for the next week or 2 and see what that does to quiet things down

## 2015-05-14 ENCOUNTER — Ambulatory Visit (INDEPENDENT_AMBULATORY_CARE_PROVIDER_SITE_OTHER): Payer: Medicare HMO | Admitting: Licensed Clinical Social Worker

## 2015-05-14 ENCOUNTER — Ambulatory Visit (INDEPENDENT_AMBULATORY_CARE_PROVIDER_SITE_OTHER): Payer: Medicare HMO | Admitting: Family Medicine

## 2015-05-14 ENCOUNTER — Encounter: Payer: Self-pay | Admitting: Family Medicine

## 2015-05-14 ENCOUNTER — Telehealth: Payer: Self-pay | Admitting: Family Medicine

## 2015-05-14 VITALS — BP 120/80 | HR 84

## 2015-05-14 DIAGNOSIS — F419 Anxiety disorder, unspecified: Secondary | ICD-10-CM | POA: Diagnosis not present

## 2015-05-14 DIAGNOSIS — F332 Major depressive disorder, recurrent severe without psychotic features: Secondary | ICD-10-CM | POA: Diagnosis not present

## 2015-05-14 MED ORDER — CLONAZEPAM 0.5 MG PO TABS: 0.5000 mg | ORAL_TABLET | Freq: Two times a day (BID) | ORAL | Status: DC | PRN

## 2015-05-14 NOTE — Telephone Encounter (Signed)
Clonazepam called into [facility] and I left a message for Marya Amsler Psychologist [phone] to call JCL back

## 2015-05-14 NOTE — Progress Notes (Signed)
   Subjective:    Patient ID: Marilyn Dixon, female    DOB: [birth year], 64 y.o.   MRN: [id number]  HPI She is here for consult concerning anxiety and panic attacks. She feels as if she has underlying distress. She cites losing a bracelet in June that had sent to [facility]. She also has concerns over Medicare in the cost of medications which has her stress. 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 She is in counseling with Marya Amsler.   Review of Systems     Objective:   Physical Exam Alert and in no distress but slightly stressed appearing.       Assessment & Plan:  Anxiety - Plan: clonazePAM (KLONOPIN) 0.5 MG tablet I will add Klonopin to her regimen. We'll also discuss this with her therapist. It seems that her symptoms are quite high concerning the stresses that she states she is under which makes me think that there is an underlying issue that is more of a concern but so far subconscious.

## 2015-05-20 ENCOUNTER — Ambulatory Visit (INDEPENDENT_AMBULATORY_CARE_PROVIDER_SITE_OTHER): Payer: Medicare HMO | Admitting: Licensed Clinical Social Worker

## 2015-05-20 DIAGNOSIS — F332 Major depressive disorder, recurrent severe without psychotic features: Secondary | ICD-10-CM

## 2015-05-27 LAB — HM COLONOSCOPY

## 2015-06-05 ENCOUNTER — Ambulatory Visit (INDEPENDENT_AMBULATORY_CARE_PROVIDER_SITE_OTHER): Payer: Medicare HMO | Admitting: Licensed Clinical Social Worker

## 2015-06-05 DIAGNOSIS — F3341 Major depressive disorder, recurrent, in partial remission: Secondary | ICD-10-CM | POA: Diagnosis not present

## 2015-06-22 ENCOUNTER — Encounter: Payer: Self-pay | Admitting: Family Medicine

## 2015-06-22 ENCOUNTER — Other Ambulatory Visit: Payer: Self-pay | Admitting: Physician Assistant

## 2015-06-22 ENCOUNTER — Ambulatory Visit (INDEPENDENT_AMBULATORY_CARE_PROVIDER_SITE_OTHER): Payer: Medicare HMO | Admitting: Family Medicine

## 2015-06-22 VITALS — BP 110/72 | HR 68 | Wt 140.0 lb

## 2015-06-22 DIAGNOSIS — L821 Other seborrheic keratosis: Secondary | ICD-10-CM | POA: Diagnosis not present

## 2015-06-22 DIAGNOSIS — M7072 Other bursitis of hip, left hip: Secondary | ICD-10-CM | POA: Diagnosis not present

## 2015-06-22 DIAGNOSIS — Z23 Encounter for immunization: Secondary | ICD-10-CM | POA: Diagnosis not present

## 2015-06-22 DIAGNOSIS — D485 Neoplasm of uncertain behavior of skin: Secondary | ICD-10-CM | POA: Diagnosis not present

## 2015-06-22 DIAGNOSIS — D225 Melanocytic nevi of trunk: Secondary | ICD-10-CM | POA: Diagnosis not present

## 2015-06-22 NOTE — Progress Notes (Signed)
   Subjective:    Patient ID: Marilyn Dixon, female    DOB: 20-Dec-1950, 64 y.o.   MRN: [id number]  HPI She complains of left buttock pain last 4 days. Prior to this she did a lot of car travel in the [place]. No history of injury. The pain does radiate down the posterior thigh to the knee area. Increased pain when she rolls over in bed. Walking causes only minor discomfort.   Review of Systems     Objective:   Physical Exam Alert and in no distress. Normal hip motion. Slight tenderness palpation in the initial tuberosity area. SI joint is nontender.       Assessment & Plan:  Bursitis of pelvic region, left  Need for prophylactic vaccination and inoculation against influenza - Plan: Flu vaccine HIGH DOSE PF (Fluzone High dose)  Commend conservative care with heat and anti-inflammatory and return here if symptoms continue.

## 2015-06-22 NOTE — Patient Instructions (Signed)
Heat for 20 minutes 3 times per day.  2 Aleve twice per day 

## 2015-08-17 ENCOUNTER — Encounter: Payer: Self-pay | Admitting: Family Medicine

## 2015-08-17 ENCOUNTER — Ambulatory Visit (INDEPENDENT_AMBULATORY_CARE_PROVIDER_SITE_OTHER): Payer: Medicare HMO | Admitting: Family Medicine

## 2015-08-17 VITALS — BP 110/70 | HR 78 | Ht 63.0 in | Wt 143.0 lb

## 2015-08-17 DIAGNOSIS — F39 Unspecified mood [affective] disorder: Secondary | ICD-10-CM

## 2015-08-17 DIAGNOSIS — D126 Benign neoplasm of colon, unspecified: Secondary | ICD-10-CM

## 2015-08-17 DIAGNOSIS — H409 Unspecified glaucoma: Secondary | ICD-10-CM

## 2015-08-17 DIAGNOSIS — N959 Unspecified menopausal and perimenopausal disorder: Secondary | ICD-10-CM

## 2015-08-17 DIAGNOSIS — R69 Illness, unspecified: Secondary | ICD-10-CM | POA: Diagnosis not present

## 2015-08-17 DIAGNOSIS — Z79899 Other long term (current) drug therapy: Secondary | ICD-10-CM

## 2015-08-17 DIAGNOSIS — J301 Allergic rhinitis due to pollen: Secondary | ICD-10-CM | POA: Diagnosis not present

## 2015-08-17 DIAGNOSIS — E785 Hyperlipidemia, unspecified: Secondary | ICD-10-CM

## 2015-08-17 DIAGNOSIS — Z1159 Encounter for screening for other viral diseases: Secondary | ICD-10-CM

## 2015-08-17 DIAGNOSIS — N951 Menopausal and female climacteric states: Secondary | ICD-10-CM | POA: Insufficient documentation

## 2015-08-17 DIAGNOSIS — F338 Other recurrent depressive disorders: Secondary | ICD-10-CM

## 2015-08-17 LAB — COMPREHENSIVE METABOLIC PANEL
ALBUMIN: 4.6 g/dL (ref 3.6–5.1)
ALK PHOS: 111 U/L (ref 33–130)
ALT: 19 U/L (ref 6–29)
AST: 21 U/L (ref 10–35)
BILIRUBIN TOTAL: 0.7 mg/dL (ref 0.2–1.2)
BUN: 12 mg/dL (ref 7–25)
CALCIUM: 9.7 mg/dL (ref 8.6–10.4)
CO2: 29 mmol/L (ref 20–31)
Chloride: 102 mmol/L (ref 98–110)
Creat: 0.86 mg/dL (ref 0.50–0.99)
GLUCOSE: 94 mg/dL (ref 65–99)
Potassium: 4.5 mmol/L (ref 3.5–5.3)
Sodium: 141 mmol/L (ref 135–146)
TOTAL PROTEIN: 6.8 g/dL (ref 6.1–8.1)

## 2015-08-17 LAB — CBC WITH DIFFERENTIAL/PLATELET
BASOS ABS: 0 10*3/uL (ref 0.0–0.1)
Basophils Relative: 0 % (ref 0–1)
Eosinophils Absolute: 0.1 10*3/uL (ref 0.0–0.7)
Eosinophils Relative: 1 % (ref 0–5)
HEMATOCRIT: 46.9 % — AB (ref 36.0–46.0)
HEMOGLOBIN: 16 g/dL — AB (ref 12.0–15.0)
LYMPHS PCT: 17 % (ref 12–46)
Lymphs Abs: 1.2 10*3/uL (ref 0.7–4.0)
MCH: 31.1 pg (ref 26.0–34.0)
MCHC: 34.1 g/dL (ref 30.0–36.0)
MCV: 91.1 fL (ref 78.0–100.0)
MPV: 10.5 fL (ref 8.6–12.4)
Monocytes Absolute: 0.4 10*3/uL (ref 0.1–1.0)
Monocytes Relative: 6 % (ref 3–12)
NEUTROS ABS: 5.5 10*3/uL (ref 1.7–7.7)
NEUTROS PCT: 76 % (ref 43–77)
Platelets: 253 10*3/uL (ref 150–400)
RBC: 5.15 MIL/uL — AB (ref 3.87–5.11)
RDW: 12.9 % (ref 11.5–15.5)
WBC: 7.3 10*3/uL (ref 4.0–10.5)

## 2015-08-17 LAB — LIPID PANEL
Cholesterol: 200 mg/dL (ref 125–200)
HDL: 64 mg/dL (ref >=46)
LDL CALC: 117 mg/dL (ref <130)
TRIGLYCERIDES: 95 mg/dL (ref <150)
Total CHOL/HDL Ratio: 3.1 Ratio (ref <=5.0)
VLDL: 19 mg/dL (ref <30)

## 2015-08-17 NOTE — Progress Notes (Signed)
Subjective:    Patient ID: Marilyn Dixon, female    DOB: 05-04-1951, 64 y.o.   MRN: [id number]  HPI She is here for an interval evaluation. She does have underlying allergies and these are under good control. She does occasionally use Prilosec for reflux type symptoms. She is also on hormone replacement and is considering stopping this in the near future. She also continues to do fairly well on her Lexapro. She was placed on this when she is going through a lateral stress with the death of her mother and retirement. She does have glaucoma and is being followed for this. She had a Pap in 2015 as well as a mammogram that year. Colonoscopy was done this year. Her back pain is doing much better. She occasionally will use, and for stress. She does have a previous history of SAD. Family and social history as well as health maintenance and immunizations was reviewed. She is now retired and she and her husband are doing a lot of traveling as well as going to [employer]  sporting events.   Review of Systems     Objective:   Physical Exam BP 110/70 mmHg  Pulse 78  Ht 5\' 3"  (1.6 m)  Wt 143 lb (64.864 kg)  BMI 25.34 kg/m2  General Appearance:    Alert, cooperative, no distress, appears stated age  Head:    Normocephalic, without obvious abnormality, atraumatic  Eyes:    PERRL, conjunctiva/corneas clear, EOM's intact, fundi    benign  Ears:    Normal TM's and external ear canals  Nose:   Nares normal, mucosa normal, no drainage or sinus   tenderness  Throat:   Lips, mucosa, and tongue normal; teeth and gums normal  Neck:   Supple, no lymphadenopathy;  thyroid:  no   enlargement/tenderness/nodules; no carotid   bruit or JVD  Back:    Spine nontender, no curvature, ROM normal, no CVA     tenderness  Lungs:     Clear to auscultation bilaterally without wheezes, rales or     ronchi; respirations unlabored  Chest Wall:    No tenderness or deformity   Heart:    Regular rate and rhythm, S1 and S2 normal, no  murmur, rub   or gallop  Breast Exam:    Deferred to GYN  Abdomen:     Soft, non-tender, nondistended, normoactive bowel sounds,    no masses, no hepatosplenomegaly  Genitalia:    Deferred to GYN     Extremities:   No clubbing, cyanosis or edema  Pulses:   2+ and symmetric all extremities  Skin:   Skin color, texture, turgor normal, no rashes or lesions  Lymph nodes:   Cervical, supraclavicular, and axillary nodes normal  Neurologic:   CNII-XII intact, normal strength, sensation and gait; reflexes 2+ and symmetric throughout          Psych:   Normal mood, affect, hygiene and grooming.          Assessment & Plan:  Adenomatous polyp of colon  Non-seasonal allergic rhinitis due to pollen  Glaucoma  Hyperlipidemia LDL goal <100 - Plan: Lipid panel  Postmenopausal disorder  Seasonal affective disorder ([place])  Encounter for long-term (current) use of medications - Plan: CBC with Differential/Platelet, Comprehensive metabolic panel, Hepatitis C antibody, Lipid panel  Need for hepatitis C screening test - Plan: Hepatitis C antibody  Discussed stopping the hormone replacement sometime next spring and she plans to do this. Also discussed possibly stopping Effexor  however think it would be much better to wait till later on the year to make sure she does well off the hormones. She is comfortable with that approach.

## 2015-08-17 NOTE — Patient Instructions (Signed)
Go ahead and stop the Prometrium and the Vivelle sometime this spring

## 2015-08-18 LAB — HEPATITIS C ANTIBODY: HCV AB: NEGATIVE

## 2015-08-24 ENCOUNTER — Other Ambulatory Visit: Payer: Self-pay

## 2015-08-24 DIAGNOSIS — Z1231 Encounter for screening mammogram for malignant neoplasm of breast: Secondary | ICD-10-CM

## 2015-09-04 DIAGNOSIS — H25013 Cortical age-related cataract, bilateral: Secondary | ICD-10-CM | POA: Diagnosis not present

## 2015-09-04 DIAGNOSIS — H04123 Dry eye syndrome of bilateral lacrimal glands: Secondary | ICD-10-CM | POA: Diagnosis not present

## 2015-09-04 DIAGNOSIS — H2513 Age-related nuclear cataract, bilateral: Secondary | ICD-10-CM | POA: Diagnosis not present

## 2015-09-04 DIAGNOSIS — H40023 Open angle with borderline findings, high risk, bilateral: Secondary | ICD-10-CM | POA: Diagnosis not present

## 2015-09-24 ENCOUNTER — Ambulatory Visit
Admission: RE | Admit: 2015-09-24 | Discharge: 2015-09-24 | Disposition: A | Discharge status: Home / Self Care | Payer: Medicare HMO | Source: Ambulatory Visit

## 2015-09-24 DIAGNOSIS — Z1231 Encounter for screening mammogram for malignant neoplasm of breast: Secondary | ICD-10-CM | POA: Diagnosis not present

## 2015-09-29 ENCOUNTER — Ambulatory Visit (INDEPENDENT_AMBULATORY_CARE_PROVIDER_SITE_OTHER): Payer: Medicare HMO | Admitting: Licensed Clinical Social Worker

## 2015-09-29 DIAGNOSIS — F332 Major depressive disorder, recurrent severe without psychotic features: Secondary | ICD-10-CM | POA: Diagnosis not present

## 2015-10-21 ENCOUNTER — Other Ambulatory Visit: Payer: Self-pay | Admitting: Oral Surgery

## 2015-10-21 DIAGNOSIS — D230 Other benign neoplasm of skin of lip: Secondary | ICD-10-CM | POA: Diagnosis not present

## 2015-10-28 ENCOUNTER — Encounter: Payer: Self-pay | Admitting: Family Medicine

## 2015-10-28 ENCOUNTER — Ambulatory Visit (INDEPENDENT_AMBULATORY_CARE_PROVIDER_SITE_OTHER): Payer: Medicare HMO | Admitting: Family Medicine

## 2015-10-28 VITALS — BP 114/68 | Temp 98.0°F | Wt 144.0 lb

## 2015-10-28 DIAGNOSIS — J0100 Acute maxillary sinusitis, unspecified: Secondary | ICD-10-CM | POA: Diagnosis not present

## 2015-10-28 MED ORDER — AMOXICILLIN-POT CLAVULANATE 875-125 MG PO TABS: 1.0000 | ORAL_TABLET | Freq: Two times a day (BID) | ORAL | Status: DC

## 2015-10-28 NOTE — Progress Notes (Signed)
   Subjective:    Patient ID: Marilyn Dixon, female    DOB: 11/29/1950, 65 y.o.   MRN: [id number]  HPI She complains that 2 days ago she started having bouts of sneezing followed the next day by rhinorrhea, watery eyes, headache, sinus congestion, upper tooth discomfort malaise. Today all of these symptoms have worsened.She is scheduled to go to a basketball game tomorrow night and definitely [place] it[place]   Review of Systems     Objective:   Physical Exam Alert and in no distress. Tympanic membranes and canals are normal. Pharyngeal area is normal. Neck is supple without adenopathy or thyromegaly. Nasal mucosa is normal. Tender over frontal and maxillary sinuses.Cardiac exam shows a regular sinus rhythm without murmurs or gallops. Lungs are clear to auscultation.        Assessment & Plan:  Acute maxillary sinusitis, recurrence not specified - Plan: amoxicillin-clavulanate (AUGMENTIN) 875-125 MG tablet Tums hit her relatively quickly and I will therefore treat her aggressively.Encouraged pain medications and decongestants as needed.

## 2015-11-19 DIAGNOSIS — H04123 Dry eye syndrome of bilateral lacrimal glands: Secondary | ICD-10-CM | POA: Diagnosis not present

## 2015-11-19 DIAGNOSIS — H40023 Open angle with borderline findings, high risk, bilateral: Secondary | ICD-10-CM | POA: Diagnosis not present

## 2015-12-15 ENCOUNTER — Encounter: Payer: Self-pay | Admitting: Family Medicine

## 2015-12-15 ENCOUNTER — Ambulatory Visit (INDEPENDENT_AMBULATORY_CARE_PROVIDER_SITE_OTHER): Payer: Medicare HMO | Admitting: Family Medicine

## 2015-12-15 VITALS — BP 118/60 | HR 64 | Temp 98.0°F | Wt 145.0 lb

## 2015-12-15 DIAGNOSIS — J029 Acute pharyngitis, unspecified: Secondary | ICD-10-CM

## 2015-12-15 DIAGNOSIS — J0101 Acute recurrent maxillary sinusitis: Secondary | ICD-10-CM | POA: Diagnosis not present

## 2015-12-15 LAB — POCT RAPID STREP A (OFFICE): RAPID STREP A SCREEN: NEGATIVE

## 2015-12-15 MED ORDER — AMOXICILLIN-POT CLAVULANATE 875-125 MG PO TABS: 1.0000 | ORAL_TABLET | Freq: Two times a day (BID) | ORAL | Status: DC

## 2015-12-15 NOTE — Progress Notes (Signed)
Subjective:  Marilyn Dixon is a 65 y.o. female who presents for complaints of a 5 day history of runny nose, watery eyes, maxillary sinus tenderness and congestion to maxillary sinuses, sore throat, and dry cough. Prior to the past 5 days of symptoms, she was diagnosed with a sinus infection and got 100% better. Felt fine for about a week total before recurrent illness.  Started taking daily Zyrtec for seasonal allergies about 1 month ago.  Denies fever, ear pain, nausea, vomiting, diarrhea. Does not smoke.   Treatment to date: antibiotics, cough suppressants and decongestants.  Denies sick contacts.  No other aggravating or relieving factors.  No other c/o.  ROS as in subjective.   Objective: Filed Vitals:   12/15/15 1039  BP: 118/60  Pulse: 64  Temp: 98 F (36.7 C)    General appearance: Alert, WD/WN, no distress, mildly ill appearing                             Skin: warm, no rash                           Head: maxillary sinus tenderness otherwise nontender                            Eyes: conjunctiva normal, corneas clear, PERRLA                            Ears: pearly TMs, external ear canals normal                          Nose: septum midline, turbinates swollen, with erythema and clear discharge             Mouth/throat: MMM, tongue normal, mild pharyngeal erythema                           Neck: supple, mild anterior cervical adenopathy, no thyromegaly, tender anterior cervical lymph nodes, left worse than right.                           Heart: RRR, normal S1, S2, no murmurs                         Lungs: CTA bilaterally, no wheezes, rales, or rhonchi     Assessment: Acute pharyngitis, unspecified etiology - Plan: POCT rapid strep A  Acute recurrent maxillary sinusitis - Plan: amoxicillin-clavulanate (AUGMENTIN) 875-125 MG tablet   Plan: Discussed that rapid strep is negative. Suspect that her symptoms are related to recurrent maxillary sinusitis and her underlying  allergies are contributing to her symptoms. A two-week course of Augmentin sent to pharmacy. Suggested symptomatic OTC remedies. Recommend that she continue treating allergies with daily Zyrtec and add daily Flonase to see if this helps.  Nasal saline spray for congestion.  Tylenol or Ibuprofen OTC for fever and malaise.  Recommend salt water gargles for throat discomfort. She will call if she has not 100% percent back to normal after completing the antibiotic or if she worsens. May need to consider imaging, sinus CT if she has recurrent symptoms.

## 2015-12-15 NOTE — Patient Instructions (Signed)
If you are not 100% back to normal after completing the antibiotic call us. Use salt water gargles for your throat, Flonase daily and Zyrtec daily for allergies. Make sure you are staying well hydrated.

## 2016-01-14 DIAGNOSIS — M9903 Segmental and somatic dysfunction of lumbar region: Secondary | ICD-10-CM | POA: Diagnosis not present

## 2016-01-14 DIAGNOSIS — M62830 Muscle spasm of back: Secondary | ICD-10-CM | POA: Diagnosis not present

## 2016-01-14 DIAGNOSIS — M545 Low back pain: Secondary | ICD-10-CM | POA: Diagnosis not present

## 2016-01-18 DIAGNOSIS — M545 Low back pain: Secondary | ICD-10-CM | POA: Diagnosis not present

## 2016-01-18 DIAGNOSIS — M9903 Segmental and somatic dysfunction of lumbar region: Secondary | ICD-10-CM | POA: Diagnosis not present

## 2016-01-18 DIAGNOSIS — M62830 Muscle spasm of back: Secondary | ICD-10-CM | POA: Diagnosis not present

## 2016-01-20 DIAGNOSIS — M62830 Muscle spasm of back: Secondary | ICD-10-CM | POA: Diagnosis not present

## 2016-01-20 DIAGNOSIS — M9903 Segmental and somatic dysfunction of lumbar region: Secondary | ICD-10-CM | POA: Diagnosis not present

## 2016-01-20 DIAGNOSIS — M545 Low back pain: Secondary | ICD-10-CM | POA: Diagnosis not present

## 2016-02-03 DIAGNOSIS — M545 Low back pain: Secondary | ICD-10-CM | POA: Diagnosis not present

## 2016-02-03 DIAGNOSIS — M62830 Muscle spasm of back: Secondary | ICD-10-CM | POA: Diagnosis not present

## 2016-02-03 DIAGNOSIS — M9903 Segmental and somatic dysfunction of lumbar region: Secondary | ICD-10-CM | POA: Diagnosis not present

## 2016-02-11 DIAGNOSIS — M62830 Muscle spasm of back: Secondary | ICD-10-CM | POA: Diagnosis not present

## 2016-02-11 DIAGNOSIS — M545 Low back pain: Secondary | ICD-10-CM | POA: Diagnosis not present

## 2016-02-11 DIAGNOSIS — M9903 Segmental and somatic dysfunction of lumbar region: Secondary | ICD-10-CM | POA: Diagnosis not present

## 2016-02-16 ENCOUNTER — Other Ambulatory Visit: Payer: Self-pay | Admitting: Family Medicine

## 2016-02-16 ENCOUNTER — Telehealth: Payer: Self-pay | Admitting: Internal Medicine

## 2016-02-16 DIAGNOSIS — E785 Hyperlipidemia, unspecified: Secondary | ICD-10-CM

## 2016-02-16 MED ORDER — ATORVASTATIN CALCIUM 10 MG PO TABS: 10.0000 mg | ORAL_TABLET | Freq: Every day | ORAL | Status: DC

## 2016-02-16 NOTE — Telephone Encounter (Signed)
Pt has an appt on Friday for med check but needed atorvastatin refilled to get to her appt. i will refill med

## 2016-02-19 ENCOUNTER — Ambulatory Visit (INDEPENDENT_AMBULATORY_CARE_PROVIDER_SITE_OTHER): Payer: Medicare HMO | Admitting: Family Medicine

## 2016-02-19 ENCOUNTER — Encounter: Payer: Self-pay | Admitting: Family Medicine

## 2016-02-19 VITALS — BP 124/80 | HR 90 | Ht 63.0 in | Wt 143.8 lb

## 2016-02-19 DIAGNOSIS — F39 Unspecified mood [affective] disorder: Secondary | ICD-10-CM | POA: Diagnosis not present

## 2016-02-19 DIAGNOSIS — J301 Allergic rhinitis due to pollen: Secondary | ICD-10-CM

## 2016-02-19 DIAGNOSIS — D126 Benign neoplasm of colon, unspecified: Secondary | ICD-10-CM | POA: Diagnosis not present

## 2016-02-19 DIAGNOSIS — F419 Anxiety disorder, unspecified: Secondary | ICD-10-CM | POA: Diagnosis not present

## 2016-02-19 DIAGNOSIS — E785 Hyperlipidemia, unspecified: Secondary | ICD-10-CM

## 2016-02-19 DIAGNOSIS — N959 Unspecified menopausal and perimenopausal disorder: Secondary | ICD-10-CM | POA: Diagnosis not present

## 2016-02-19 DIAGNOSIS — N951 Menopausal and female climacteric states: Secondary | ICD-10-CM

## 2016-02-19 DIAGNOSIS — R69 Illness, unspecified: Secondary | ICD-10-CM | POA: Diagnosis not present

## 2016-02-19 DIAGNOSIS — F338 Other recurrent depressive disorders: Secondary | ICD-10-CM

## 2016-02-19 DIAGNOSIS — Z86010 Personal history of colonic polyps: Secondary | ICD-10-CM | POA: Diagnosis not present

## 2016-02-19 MED ORDER — CLONAZEPAM 0.5 MG PO TABS: 0.5000 mg | ORAL_TABLET | Freq: Two times a day (BID) | ORAL | Status: DC | PRN

## 2016-02-19 NOTE — Progress Notes (Signed)
   Subjective:    Patient ID: Marilyn Dixon, female    DOB: 1951-02-04, 65 y.o.   MRN: [id number]  HPI She is here for medication management visit. She is now going to be coming to me for her total care. She has had mammogram as well as Pap and pelvic within the last year. She has now weaned herself off of HRT and doesn't occasionally have difficulty with hot flashes and is interested in my thoughts on how to care for this. She does have difficulty with travel causing anxiety and would like some Klonopin. This has worked well in the past and she uses this very sparingly. She does have underlying seasonal affective disorder and continues on Zoloft for this which works quite well. She does have reflux disease but presently is having no troubles. She does have colonic polyps and is on a 5 year schedule. Continues on Lipitor for treatment of her hyperlipidemia and is having no difficulty with that.   Review of Systems     Objective:   Physical Exam alert and in no distress otherwise not examined. Normal affect and mentation.      Assessment & Plan:  Postmenopausal disorder  Anxiety - Plan: clonazePAM (KLONOPIN) 0.5 MG tablet  History of colonic polyps  Non-seasonal allergic rhinitis due to pollen  Adenomatous polyp of colon  Hyperlipidemia LDL goal <100  Seasonal affective disorder ([place]) She is to continue on her present medication regimen. Her depression is under good control. She is up-to-date on all of her immunizations and chronic medications. Did recommend she try black cohosh for her hot flashes.

## 2016-02-20 ENCOUNTER — Other Ambulatory Visit: Payer: Self-pay | Admitting: Family Medicine

## 2016-02-23 ENCOUNTER — Telehealth: Payer: Self-pay

## 2016-02-23 NOTE — Telephone Encounter (Signed)
Records rcvd on pt from [facility]. Placed in your folder for review. Marilyn Dixon

## 2016-02-24 DIAGNOSIS — M545 Low back pain: Secondary | ICD-10-CM | POA: Diagnosis not present

## 2016-02-24 DIAGNOSIS — M62830 Muscle spasm of back: Secondary | ICD-10-CM | POA: Diagnosis not present

## 2016-02-24 DIAGNOSIS — M9903 Segmental and somatic dysfunction of lumbar region: Secondary | ICD-10-CM | POA: Diagnosis not present

## 2016-03-07 ENCOUNTER — Telehealth: Payer: Self-pay | Admitting: Family Medicine

## 2016-03-07 NOTE — Telephone Encounter (Signed)
Rcvd office notes, colonoscopy from Dr Wilford Corner

## 2016-04-13 ENCOUNTER — Ambulatory Visit (INDEPENDENT_AMBULATORY_CARE_PROVIDER_SITE_OTHER): Payer: Medicare HMO | Admitting: Family Medicine

## 2016-04-13 ENCOUNTER — Encounter: Payer: Self-pay | Admitting: Family Medicine

## 2016-04-13 VITALS — BP 130/70 | HR 91 | Resp 16 | Wt 148.0 lb

## 2016-04-13 DIAGNOSIS — F419 Anxiety disorder, unspecified: Secondary | ICD-10-CM

## 2016-04-13 MED ORDER — CLONAZEPAM 0.5 MG PO TABS: 0.5000 mg | ORAL_TABLET | Freq: Two times a day (BID) | ORAL | 0 refills | Status: DC | PRN

## 2016-04-13 NOTE — Progress Notes (Signed)
   Subjective:    Patient ID: Marilyn Dixon, female    DOB: 02/18/1951, 65 y.o.   MRN: [id number]  HPI Is here for consult concerning anxiety. On a recent trip they apparently contracted bedbugs and they are now dealing with this. Has her quite distraught. She also is getting ready for another trip to [place] and to [place]. She is somewhat stressed over this as they will be driving. She is also concerned over possible problems with bedbugs in another location. She has discussed this with her therapist. She would like Klonopin renewed. 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   Review of Systems     Objective:   Physical Exam 10 in no distress with appropriate affect      Assessment & Plan:  Anxiety - Plan: clonazePAM (KLONOPIN) 0.5 MG tablet I will renew her Klonopin. She will use this on an as-needed basis and continue in counseling.

## 2016-05-21 ENCOUNTER — Other Ambulatory Visit: Payer: Self-pay | Admitting: Family Medicine

## 2016-07-04 ENCOUNTER — Ambulatory Visit (INDEPENDENT_AMBULATORY_CARE_PROVIDER_SITE_OTHER): Payer: Medicare HMO | Admitting: Family Medicine

## 2016-07-04 ENCOUNTER — Encounter: Payer: Self-pay | Admitting: Family Medicine

## 2016-07-04 VITALS — BP 120/78 | HR 76 | Wt 149.0 lb

## 2016-07-04 DIAGNOSIS — M7652 Patellar tendinitis, left knee: Secondary | ICD-10-CM | POA: Diagnosis not present

## 2016-07-04 DIAGNOSIS — Z23 Encounter for immunization: Secondary | ICD-10-CM

## 2016-07-04 DIAGNOSIS — M8580 Other specified disorders of bone density and structure, unspecified site: Secondary | ICD-10-CM

## 2016-07-04 DIAGNOSIS — M25552 Pain in left hip: Secondary | ICD-10-CM | POA: Diagnosis not present

## 2016-07-04 DIAGNOSIS — M7651 Patellar tendinitis, right knee: Secondary | ICD-10-CM | POA: Diagnosis not present

## 2016-07-04 DIAGNOSIS — M25551 Pain in right hip: Secondary | ICD-10-CM | POA: Diagnosis not present

## 2016-07-04 NOTE — Patient Instructions (Signed)
Back off on the elliptical and stairstepper and just do the treadmill. Take 2 Aleve twice per day and you can also use heat to needs for 20 minutes 3 times per day.... Do this for the next several weeks

## 2016-07-04 NOTE — Progress Notes (Signed)
   Subjective:    Patient ID: Marilyn Dixon, female    DOB: Jul 09, 1951, 65 y.o.   MRN: [id number]  HPI She complains of a several week history of difficulty with bilateral knee pain, right greater than left. She notes this especially going up or down stairs. Sitting does not cause problems. There is a question of grinding. No history of overuse. She does exercise regularly. She also complains of intermittent bilateral hip pain but this is much less significant. She also has a previous history of osteopenia and has been on multivitamins and vitamin D. Her last DEXA was 2 years ago.  Review of Systems     Objective:   Physical Exam Alert and in no distress. Full motion of the hips with minimal if any discomfort. Exam of both knees shows no effusion. Joint lines are none tender to palpation. Ligaments intact. Tender to palpation over the inferior pole of the patella and also into the tibial tubercle.       Assessment & Plan:  Need for prophylactic vaccination and inoculation against influenza - Plan: Flu vaccine HIGH DOSE PF (Fluzone High dose)  Osteopenia determined by x-ray - Plan: DG Bone Density  Patellar tendinitis of both knees  Bilateral hip pain  I explained that even though she hasn't had any overuse this is most likely a patellar tendinitis and did recommend walking and backing off of using the elliptical as well as a stair stepper. Also recommend 2 Aleve twice per day. She will call if continued difficulty.

## 2016-07-14 ENCOUNTER — Ambulatory Visit
Admission: RE | Admit: 2016-07-14 | Discharge: 2016-07-14 | Disposition: A | Discharge status: Home / Self Care | Payer: Medicare HMO | Source: Ambulatory Visit | Attending: Family Medicine | Admitting: Family Medicine

## 2016-07-14 DIAGNOSIS — Z780 Asymptomatic menopausal state: Secondary | ICD-10-CM | POA: Diagnosis not present

## 2016-07-14 DIAGNOSIS — M8580 Other specified disorders of bone density and structure, unspecified site: Secondary | ICD-10-CM

## 2016-07-14 DIAGNOSIS — M8589 Other specified disorders of bone density and structure, multiple sites: Secondary | ICD-10-CM | POA: Diagnosis not present

## 2016-07-20 ENCOUNTER — Other Ambulatory Visit: Payer: Self-pay | Admitting: Family Medicine

## 2016-07-20 DIAGNOSIS — F338 Other recurrent depressive disorders: Secondary | ICD-10-CM

## 2016-07-20 DIAGNOSIS — F4322 Adjustment disorder with anxiety: Secondary | ICD-10-CM

## 2016-07-21 NOTE — Telephone Encounter (Signed)
Is this okay to refill? 

## 2016-08-25 ENCOUNTER — Ambulatory Visit (INDEPENDENT_AMBULATORY_CARE_PROVIDER_SITE_OTHER): Payer: Medicare HMO | Admitting: Family Medicine

## 2016-08-25 ENCOUNTER — Encounter: Payer: Self-pay | Admitting: Family Medicine

## 2016-08-25 VITALS — BP 124/80 | HR 83 | Resp 12 | Ht 63.5 in | Wt 151.0 lb

## 2016-08-25 DIAGNOSIS — F339 Major depressive disorder, recurrent, unspecified: Secondary | ICD-10-CM | POA: Diagnosis not present

## 2016-08-25 DIAGNOSIS — F4322 Adjustment disorder with anxiety: Secondary | ICD-10-CM

## 2016-08-25 DIAGNOSIS — F338 Other recurrent depressive disorders: Secondary | ICD-10-CM

## 2016-08-25 DIAGNOSIS — E785 Hyperlipidemia, unspecified: Secondary | ICD-10-CM

## 2016-08-25 DIAGNOSIS — R69 Illness, unspecified: Secondary | ICD-10-CM | POA: Diagnosis not present

## 2016-08-25 DIAGNOSIS — M8580 Other specified disorders of bone density and structure, unspecified site: Secondary | ICD-10-CM

## 2016-08-25 LAB — COMPREHENSIVE METABOLIC PANEL
ALT: 19 U/L (ref 6–29)
AST: 23 U/L (ref 10–35)
Albumin: 4.1 g/dL (ref 3.6–5.1)
Alkaline Phosphatase: 113 U/L (ref 33–130)
BILIRUBIN TOTAL: 0.6 mg/dL (ref 0.2–1.2)
BUN: 13 mg/dL (ref 7–25)
CALCIUM: 9.4 mg/dL (ref 8.6–10.4)
CO2: 26 mmol/L (ref 20–31)
CREATININE: 0.8 mg/dL (ref 0.50–0.99)
Chloride: 107 mmol/L (ref 98–110)
Glucose, Bld: 100 mg/dL — ABNORMAL HIGH (ref 65–99)
Potassium: 4.2 mmol/L (ref 3.5–5.3)
Sodium: 141 mmol/L (ref 135–146)
Total Protein: 6.5 g/dL (ref 6.1–8.1)

## 2016-08-25 LAB — LIPID PANEL
CHOLESTEROL: 197 mg/dL (ref <200)
HDL: 78 mg/dL (ref >50)
LDL Cholesterol: 96 mg/dL (ref <100)
Total CHOL/HDL Ratio: 2.5 Ratio (ref <5.0)
Triglycerides: 115 mg/dL (ref <150)
VLDL: 23 mg/dL (ref <30)

## 2016-08-25 LAB — CBC WITH DIFFERENTIAL/PLATELET
BASOS ABS: 58 {cells}/uL (ref 0–200)
Basophils Relative: 1 %
EOS ABS: 174 {cells}/uL (ref 15–500)
Eosinophils Relative: 3 %
HEMATOCRIT: 42.8 % (ref 35.0–45.0)
Hemoglobin: 14.2 g/dL (ref 11.7–15.5)
LYMPHS PCT: 21 %
Lymphs Abs: 1218 cells/uL (ref 850–3900)
MCH: 30 pg (ref 27.0–33.0)
MCHC: 33.2 g/dL (ref 32.0–36.0)
MCV: 90.3 fL (ref 80.0–100.0)
MONO ABS: 348 {cells}/uL (ref 200–950)
MONOS PCT: 6 %
MPV: 10.2 fL (ref 7.5–12.5)
NEUTROS PCT: 69 %
Neutro Abs: 4002 cells/uL (ref 1500–7800)
PLATELETS: 260 10*3/uL (ref 140–400)
RBC: 4.74 MIL/uL (ref 3.80–5.10)
RDW: 12.9 % (ref 11.0–15.0)
WBC: 5.8 10*3/uL (ref 4.0–10.5)

## 2016-08-25 MED ORDER — ATORVASTATIN CALCIUM 10 MG PO TABS: ORAL_TABLET | ORAL | 3 refills | Status: DC

## 2016-08-25 MED ORDER — SERTRALINE HCL 100 MG PO TABS: ORAL_TABLET | ORAL | 3 refills | Status: DC

## 2016-08-25 NOTE — Patient Instructions (Signed)
  Marilyn Dixon , Thank you for taking time to come for your Medicare Wellness Visit. I appreciate your ongoing commitment to your health goals. Please review the following plan we discussed and let me know if I can assist you in the future.   These are the goals we discussed: Goals    None      This is a list of the screening recommended for you and due dates:  Health Maintenance  Topic Date Due  . Pneumonia vaccines (2 of 2 - PPSV23) 03/31/2015  . Tetanus Vaccine  06/18/2017  . Mammogram  09/23/2017  . Colon Cancer Screening  05/26/2025  . Flu Shot  Completed  . DEXA scan (bone density measurement)  Completed  . Shingles Vaccine  Completed  .  Hepatitis C: One time screening is recommended by Center for Disease Control  (CDC) for  adults born from [birth year] through 1966.   Completed

## 2016-08-25 NOTE — Progress Notes (Signed)
Subjective:   HPI  Marilyn Dixon is a 65 y.o. female who presents for a Medication check. She does have a lesion present on the palmar surface of her left hand that has her concerned. She also complains of bilateral neck discomfort that is apparently worse with certain motions but no numbness, tingling, weakness or pain. She does have a history of osteopenia. Recent DEXA scan did show this again. She will be getting follow-up with her psychologist, Marya Amsler. This time a year is especially difficult due to the death of her mother. She is now retired and volunteering. Her marriage is going quite well. They've been married for 25 years.  Medical care team includes:     Preventative care: Opth:  Dec 20,17 Last dental visit: 9/17 Last colonoscopy:05/27/15 Last mammogram: 09/24/15 Last gynecological exam: 2015 Last EKG: 03/01/12 Last labs:08/17/15  Prior vaccinations: TD or Tdap:06/19/07 Influenza: 07/04/16 Pneumococcal: 23: 05/11/05 143: 07/29/13 Shingles/Zostavax: 07/22/10  Advanced directive: Yes  Reviewed their medical, surgical, family, social, medication, and allergy history and updated chart as appropriate. Review of Systems Negative except as above     Objective:   Physical Exam   General appearance: alert, no distress, WD/WN,  Skin: Slightly irritated lesion noted on the mid palmar area on the left. HEENT: normocephalic, conjunctiva/corneas normal, sclerae anicteric, PERRLA, EOMi, nares patent, no discharge or erythema, pharynx normal Oral cavity: MMM, tongue normal, teeth normal Neck: supple, no lymphadenopathy, no thyromegaly, no masses, normal ROM  Heart: RRR, normal S1, S2, no murmurs Lungs: CTA bilaterally, no wheezes, rhonchi, or rales Abdomen: +bs, soft, non tender, non distended, no masses, no hepatomegaly, no splenomegaly, no bruits Back: non tender, normal ROM, no scoliosis Musculoskeletal: upper extremities non tender, no obvious deformity, normal ROM  throughout, lower extremities non tender, no obvious deformity, normal ROM throughout Extremities: no edema, no cyanosis, no clubbing Pulses: 2+ symmetric, upper and lower extremities, normal cap refill Neurological: alert, oriented x 3, CN2-12 intact, strength normal upper extremities and lower extremities, sensation normal throughout, DTRs 2+ throughout, no cerebellar signs, gait normal Psychiatric: normal affect, behavior normal, pleasant  Recent DEXA scan did show osteopenia   Assessment and Plan :   Osteopenia determined by x-ray - Plan: CBC with Differential/Platelet, Comprehensive metabolic panel, VITAMIN D 25 Hydroxy (Vit-D Deficiency, Fractures)  Hyperlipidemia LDL goal <100 - Plan: CBC with Differential/Platelet, Comprehensive metabolic panel, Lipid panel, atorvastatin (LIPITOR) 10 MG tablet  Seasonal affective disorder ([facility]) - Plan: sertraline (ZOLOFT) 100 MG tablet  Reaction, adjustment, with anxious mood - Plan: sertraline (ZOLOFT) 100 MG tablet She will continue on her present medications. Encouraged her to see the psychologist to help continue dealing with putting everything in proper perspective and to make the holidays a more positive experience for her. No therapy for the neck. Did recommend leaving the lesions alone on hand if they do not disappear, I will get her in to see dermatology.  Physical exam - discussed healthy lifestyle, diet, exercise, preventative care, vaccinations, and addressed their concerns.  Handout given. Follow-up as needed

## 2016-08-26 LAB — VITAMIN D 25 HYDROXY (VIT D DEFICIENCY, FRACTURES): Vit D, 25-Hydroxy: 47 ng/mL (ref 30–100)

## 2016-09-01 ENCOUNTER — Ambulatory Visit (INDEPENDENT_AMBULATORY_CARE_PROVIDER_SITE_OTHER): Payer: Medicare HMO | Admitting: Licensed Clinical Social Worker

## 2016-09-01 DIAGNOSIS — F3341 Major depressive disorder, recurrent, in partial remission: Secondary | ICD-10-CM | POA: Diagnosis not present

## 2016-09-01 DIAGNOSIS — R69 Illness, unspecified: Secondary | ICD-10-CM | POA: Diagnosis not present

## 2016-09-22 DIAGNOSIS — H04123 Dry eye syndrome of bilateral lacrimal glands: Secondary | ICD-10-CM | POA: Diagnosis not present

## 2016-09-22 DIAGNOSIS — H25013 Cortical age-related cataract, bilateral: Secondary | ICD-10-CM | POA: Diagnosis not present

## 2016-09-22 DIAGNOSIS — H2513 Age-related nuclear cataract, bilateral: Secondary | ICD-10-CM | POA: Diagnosis not present

## 2016-09-22 DIAGNOSIS — H40023 Open angle with borderline findings, high risk, bilateral: Secondary | ICD-10-CM | POA: Diagnosis not present

## 2016-10-05 ENCOUNTER — Ambulatory Visit: Payer: Medicare HMO | Admitting: Licensed Clinical Social Worker

## 2016-10-07 ENCOUNTER — Other Ambulatory Visit: Payer: Self-pay

## 2016-10-07 DIAGNOSIS — F419 Anxiety disorder, unspecified: Secondary | ICD-10-CM

## 2016-10-07 MED ORDER — CLONAZEPAM 0.5 MG PO TABS: 0.5000 mg | ORAL_TABLET | Freq: Two times a day (BID) | ORAL | 0 refills | Status: DC | PRN

## 2016-10-10 ENCOUNTER — Ambulatory Visit (INDEPENDENT_AMBULATORY_CARE_PROVIDER_SITE_OTHER): Payer: Medicare HMO | Admitting: Licensed Clinical Social Worker

## 2016-10-10 DIAGNOSIS — F331 Major depressive disorder, recurrent, moderate: Secondary | ICD-10-CM | POA: Diagnosis not present

## 2016-10-10 DIAGNOSIS — R69 Illness, unspecified: Secondary | ICD-10-CM | POA: Diagnosis not present

## 2016-10-26 ENCOUNTER — Ambulatory Visit (INDEPENDENT_AMBULATORY_CARE_PROVIDER_SITE_OTHER): Payer: Medicare HMO | Admitting: Licensed Clinical Social Worker

## 2016-10-26 DIAGNOSIS — F3341 Major depressive disorder, recurrent, in partial remission: Secondary | ICD-10-CM

## 2016-10-26 DIAGNOSIS — R69 Illness, unspecified: Secondary | ICD-10-CM | POA: Diagnosis not present

## 2016-10-27 ENCOUNTER — Other Ambulatory Visit: Payer: Self-pay

## 2016-10-27 ENCOUNTER — Telehealth: Payer: Self-pay | Admitting: Family Medicine

## 2016-10-27 DIAGNOSIS — F419 Anxiety disorder, unspecified: Secondary | ICD-10-CM

## 2016-10-27 MED ORDER — CLONAZEPAM 0.5 MG PO TABS: 0.5000 mg | ORAL_TABLET | Freq: Two times a day (BID) | ORAL | 0 refills | Status: DC | PRN

## 2016-10-27 NOTE — Telephone Encounter (Signed)
Pt called for refills of clonazepam. She wanted you to be aware that she is seeing Marya Amsler every two weeks. Pt uses [facility] on [address] and can be reached at [phone].

## 2016-10-27 NOTE — Telephone Encounter (Signed)
Called med in per jcl 

## 2016-10-27 NOTE — Telephone Encounter (Signed)
ok 

## 2016-11-09 ENCOUNTER — Ambulatory Visit (INDEPENDENT_AMBULATORY_CARE_PROVIDER_SITE_OTHER): Payer: Medicare HMO | Admitting: Licensed Clinical Social Worker

## 2016-11-09 DIAGNOSIS — R69 Illness, unspecified: Secondary | ICD-10-CM | POA: Diagnosis not present

## 2016-11-09 DIAGNOSIS — F3341 Major depressive disorder, recurrent, in partial remission: Secondary | ICD-10-CM | POA: Diagnosis not present

## 2016-11-21 ENCOUNTER — Ambulatory Visit (INDEPENDENT_AMBULATORY_CARE_PROVIDER_SITE_OTHER): Payer: Medicare HMO | Admitting: Licensed Clinical Social Worker

## 2016-11-21 DIAGNOSIS — R69 Illness, unspecified: Secondary | ICD-10-CM | POA: Diagnosis not present

## 2016-11-21 DIAGNOSIS — F3341 Major depressive disorder, recurrent, in partial remission: Secondary | ICD-10-CM

## 2016-11-24 DIAGNOSIS — R000 Tachycardia, unspecified: Secondary | ICD-10-CM | POA: Diagnosis not present

## 2016-11-24 DIAGNOSIS — R531 Weakness: Secondary | ICD-10-CM | POA: Diagnosis not present

## 2016-11-24 DIAGNOSIS — A419 Sepsis, unspecified organism: Secondary | ICD-10-CM | POA: Diagnosis not present

## 2016-11-24 DIAGNOSIS — J189 Pneumonia, unspecified organism: Secondary | ICD-10-CM | POA: Diagnosis not present

## 2016-11-24 DIAGNOSIS — R918 Other nonspecific abnormal finding of lung field: Secondary | ICD-10-CM | POA: Diagnosis not present

## 2016-11-24 DIAGNOSIS — R69 Illness, unspecified: Secondary | ICD-10-CM | POA: Diagnosis not present

## 2016-11-24 DIAGNOSIS — R05 Cough: Secondary | ICD-10-CM | POA: Diagnosis not present

## 2016-11-24 DIAGNOSIS — R509 Fever, unspecified: Secondary | ICD-10-CM | POA: Diagnosis not present

## 2016-11-25 DIAGNOSIS — J189 Pneumonia, unspecified organism: Secondary | ICD-10-CM | POA: Diagnosis not present

## 2016-11-25 DIAGNOSIS — R0602 Shortness of breath: Secondary | ICD-10-CM | POA: Diagnosis not present

## 2016-11-25 DIAGNOSIS — R000 Tachycardia, unspecified: Secondary | ICD-10-CM | POA: Diagnosis not present

## 2016-11-25 DIAGNOSIS — R5383 Other fatigue: Secondary | ICD-10-CM | POA: Diagnosis not present

## 2016-11-25 DIAGNOSIS — D72829 Elevated white blood cell count, unspecified: Secondary | ICD-10-CM | POA: Diagnosis not present

## 2016-11-28 ENCOUNTER — Ambulatory Visit (INDEPENDENT_AMBULATORY_CARE_PROVIDER_SITE_OTHER): Payer: Medicare HMO | Admitting: Family Medicine

## 2016-11-28 ENCOUNTER — Encounter: Payer: Self-pay | Admitting: Family Medicine

## 2016-11-28 VITALS — BP 110/70 | HR 80 | Wt 150.0 lb

## 2016-11-28 DIAGNOSIS — J181 Lobar pneumonia, unspecified organism: Secondary | ICD-10-CM

## 2016-11-28 DIAGNOSIS — J189 Pneumonia, unspecified organism: Secondary | ICD-10-CM

## 2016-11-28 MED ORDER — HYDROCOD POLST-CPM POLST ER 10-8 MG/5ML PO SUER: 5.0000 mL | Freq: Two times a day (BID) | ORAL | 0 refills | Status: DC | PRN

## 2016-11-28 MED ORDER — ALBUTEROL SULFATE 108 (90 BASE) MCG/ACT IN AEPB: 2.0000 | INHALATION_SPRAY | Freq: Four times a day (QID) | RESPIRATORY_TRACT | 0 refills | Status: DC | PRN

## 2016-11-28 NOTE — Progress Notes (Signed)
   Subjective:    Patient ID: Marilyn Dixon, female    DOB: [birth year], 66 y.o.   MRN: [id number]  HPI She is here for follow-up visit after recent trip to the emergency room while she was in [place]. She was seen on February 8. The ER record was reviewed and did show evidence of leukocytosis as well as a chest x-ray showing patchy infiltrates in the lingula as well as middle lobe. She was given some ice and and ceftriaxone. She was in sent home on Levaquin. She states that she is roughly 50% better but still having fatigue and chest congestion but no fever, sore throat, earache. Does complain of slight diarrhea.   Review of Systems     Objective:   Physical Exam Alert and toxic appearing. Throat is clear. Neck is supple without adenopathy. Cardiac exam showed normal sinus rhythm. Lungs show scattered rhonchi. Emergency room record was reviewed.      Assessment & Plan:  Pneumonia of right middle lobe due to infectious organism ([place]) - Plan: Albuterol Sulfate (PROAIR RESPICLICK) 536 (90 Base) MCG/ACT AEPB, chlorpheniramine-HYDROcodone (TUSSIONEX PENNKINETIC ER) 10-8 MG/5ML SUER He will keep me informed as to how she is doing. Discussed continued difficulty with diarrhea causing further evaluation on an as-needed basis

## 2016-11-28 NOTE — Patient Instructions (Signed)
Take 2 Aleve twice per day for the pain. Also take Tylenol. Use the inhaler and see how that does to make your breathing easier and the cough medicine as needed

## 2016-12-02 ENCOUNTER — Other Ambulatory Visit: Payer: Self-pay

## 2016-12-02 ENCOUNTER — Telehealth: Payer: Self-pay

## 2016-12-02 MED ORDER — LEVOFLOXACIN 500 MG PO TABS: 500.0000 mg | ORAL_TABLET | Freq: Every day | ORAL | 0 refills | Status: DC

## 2016-12-02 NOTE — Telephone Encounter (Signed)
Sent med in and left message on VM to inform pt

## 2016-12-02 NOTE — Telephone Encounter (Signed)
Renew med

## 2016-12-02 NOTE — Telephone Encounter (Signed)
I have sent them in.

## 2016-12-02 NOTE — Telephone Encounter (Signed)
Patient called and said she is feeling better cough is less but not 100 % yet she is still using cough med but wants to know if she can have refill on antibiotics

## 2016-12-05 ENCOUNTER — Ambulatory Visit (INDEPENDENT_AMBULATORY_CARE_PROVIDER_SITE_OTHER): Payer: Medicare HMO | Admitting: Licensed Clinical Social Worker

## 2016-12-05 DIAGNOSIS — R69 Illness, unspecified: Secondary | ICD-10-CM | POA: Diagnosis not present

## 2016-12-05 DIAGNOSIS — F3341 Major depressive disorder, recurrent, in partial remission: Secondary | ICD-10-CM

## 2016-12-13 ENCOUNTER — Ambulatory Visit: Payer: Medicare HMO | Admitting: Licensed Clinical Social Worker

## 2016-12-15 ENCOUNTER — Ambulatory Visit (INDEPENDENT_AMBULATORY_CARE_PROVIDER_SITE_OTHER): Payer: Medicare HMO | Admitting: Family Medicine

## 2016-12-15 ENCOUNTER — Encounter: Payer: Self-pay | Admitting: Family Medicine

## 2016-12-15 VITALS — BP 112/70 | HR 81 | Temp 98.1°F | Resp 16 | Wt 148.2 lb

## 2016-12-15 DIAGNOSIS — Z09 Encounter for follow-up examination after completed treatment for conditions other than malignant neoplasm: Secondary | ICD-10-CM

## 2016-12-15 DIAGNOSIS — R5383 Other fatigue: Secondary | ICD-10-CM

## 2016-12-15 NOTE — Progress Notes (Signed)
   Subjective:    Patient ID: Marilyn Dixon, female    DOB: [birth year], 66 y.o.   MRN: [id number]  HPI Chief Complaint  Patient presents with  . follow-up    follow-up on pneumonia. had pneumonia back in [place] and was seen by Dr. Redmond School when she got back but still has the cough, tired weak, just wants to know what she needs to do   She is here with complaints of fatigue and a mild dry cough that has been gradually improving over the past 3 weeks and wants to make sure she is doing everything she needs to do recover from her recent illness of pneumonia.  States she is approximately 75-80% improved. States her appetite is improving and she is staying well hydrated. She is getting back to her daily activities and volunteering but has to rest in the afternoons still.  No new concerns today.   She was seen 3 weeks ago in an ED in [place] and diagnosed with pneumonia of left middle lobe and treated with antibiotics. She returned shortly after and saw Dr. Redmond School on 11/28/2016 and was feeling approximately 50% improved by then and her antibiotic was refilled.   Denies fever, chills, headache, dizziness, rhinorrhea, congestion, chest pain, palpitations, shortness of breath, GI or GU symptoms.   Reviewed allergies, medications, past medical,  and social history.    Review of Systems Pertinent positives and negatives in the history of present illness.     Objective:   Physical Exam BP 112/70   Pulse 81   Temp 98.1 F (36.7 C) (Oral)   Resp 16   Wt 148 lb 3.2 oz (67.2 kg)   SpO2 99%   BMI 25.84 kg/m  Alert and in no distress. Tympanic membranes and canals are normal. Pharyngeal area is normal. Neck is supple without adenopathy or thyromegaly. Cardiac exam shows a regular sinus rhythm without murmurs or gallops. Lungs are clear to auscultation.      Assessment & Plan:  Follow up  Discussed that she appears to be doing well. Her strength and energy is gradually returning and she is  listening to her body and resting when needed. Encouraged her to continue taking care of herself and since she is at least 70-80% back to normal after 2 rounds of antibiotics that we will do supportive treatment at this point until she is back to baseline. Advised that she should not start getting worse so if she does she will call or return.  She will call and follow up with Dr. Redmond School in a few weeks. She is aware that we would not want to do a repeat chest XR until at least 2 months from her diagnosis.

## 2017-02-09 ENCOUNTER — Telehealth: Payer: Self-pay | Admitting: Family Medicine

## 2017-02-09 NOTE — Telephone Encounter (Signed)
Left message for pt to call. She needs to schedule her medicare well visit.

## 2017-02-14 ENCOUNTER — Telehealth: Payer: Self-pay | Admitting: Family Medicine

## 2017-02-14 NOTE — Telephone Encounter (Signed)
Pt called for refill Sertraline & I called pharmacy & pt has refills.  Left message for pt

## 2017-03-03 DIAGNOSIS — R69 Illness, unspecified: Secondary | ICD-10-CM | POA: Diagnosis not present

## 2017-03-03 DIAGNOSIS — Z0000 Encounter for general adult medical examination without abnormal findings: Secondary | ICD-10-CM | POA: Diagnosis not present

## 2017-03-03 DIAGNOSIS — Z8249 Family history of ischemic heart disease and other diseases of the circulatory system: Secondary | ICD-10-CM | POA: Diagnosis not present

## 2017-03-03 DIAGNOSIS — Z79899 Other long term (current) drug therapy: Secondary | ICD-10-CM | POA: Diagnosis not present

## 2017-03-03 DIAGNOSIS — Z6825 Body mass index (BMI) 25.0-25.9, adult: Secondary | ICD-10-CM | POA: Diagnosis not present

## 2017-03-03 DIAGNOSIS — Z7982 Long term (current) use of aspirin: Secondary | ICD-10-CM | POA: Diagnosis not present

## 2017-04-06 ENCOUNTER — Telehealth: Payer: Self-pay | Admitting: Family Medicine

## 2017-04-06 NOTE — Telephone Encounter (Signed)
Left message with [name]. Pt needs a medicare well visit after 08/25/2017.

## 2017-05-30 ENCOUNTER — Other Ambulatory Visit: Payer: Self-pay | Admitting: Family Medicine

## 2017-05-30 DIAGNOSIS — F419 Anxiety disorder, unspecified: Secondary | ICD-10-CM

## 2017-05-30 NOTE — Telephone Encounter (Signed)
ok 

## 2017-05-30 NOTE — Telephone Encounter (Signed)
Called in med to pharmacy  

## 2017-05-30 NOTE — Telephone Encounter (Signed)
Is this ok to call in?

## 2017-06-07 ENCOUNTER — Ambulatory Visit (INDEPENDENT_AMBULATORY_CARE_PROVIDER_SITE_OTHER): Payer: Medicare HMO | Admitting: Family Medicine

## 2017-06-07 ENCOUNTER — Encounter: Payer: Self-pay | Admitting: Family Medicine

## 2017-06-07 VITALS — BP 150/90 | HR 84 | Resp 18 | Wt 150.6 lb

## 2017-06-07 DIAGNOSIS — Z23 Encounter for immunization: Secondary | ICD-10-CM

## 2017-06-07 DIAGNOSIS — R030 Elevated blood-pressure reading, without diagnosis of hypertension: Secondary | ICD-10-CM | POA: Diagnosis not present
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
## 2017-06-07 NOTE — Patient Instructions (Signed)
20 minutes of something physical daily. 

## 2017-06-20 ENCOUNTER — Telehealth: Payer: Self-pay | Admitting: Family Medicine

## 2017-06-20 NOTE — Telephone Encounter (Signed)
Pt dropped of immunization records for immunizations she received prior to a trip to [place]. Sending back for review and to be abstracted.

## 2017-07-15 DIAGNOSIS — J069 Acute upper respiratory infection, unspecified: Secondary | ICD-10-CM | POA: Diagnosis not present

## 2017-07-15 DIAGNOSIS — J029 Acute pharyngitis, unspecified: Secondary | ICD-10-CM | POA: Diagnosis not present

## 2017-07-17 ENCOUNTER — Ambulatory Visit (INDEPENDENT_AMBULATORY_CARE_PROVIDER_SITE_OTHER): Payer: Medicare HMO | Admitting: Family Medicine

## 2017-07-17 ENCOUNTER — Encounter: Payer: Self-pay | Admitting: Family Medicine

## 2017-07-17 VITALS — BP 140/82 | HR 71 | Temp 98.4°F | Resp 16 | Wt 151.8 lb

## 2017-07-17 DIAGNOSIS — J0140 Acute pansinusitis, unspecified: Secondary | ICD-10-CM | POA: Diagnosis not present

## 2017-07-17 DIAGNOSIS — J029 Acute pharyngitis, unspecified: Secondary | ICD-10-CM

## 2017-07-17 MED ORDER — AMOXICILLIN 875 MG PO TABS: 875.0000 mg | ORAL_TABLET | Freq: Two times a day (BID) | ORAL | 0 refills | Status: DC

## 2017-07-17 NOTE — Progress Notes (Signed)
Subjective:  Marilyn Dixon is a 66 y.o. female who presents for evaluation of sore throat.  She has possibly had a recent close exposure to someone with proven streptococcal pharyngitis.  Associated symptoms include a 5 day history of URI symptoms including nasal congestion, generalized body aches, and then a sore throat that started 3 days ago.  Reports having a mild cough is dry and mainly during the day and not keeping her awake at night.  She now is having severe sinus pressure, ears feeling clogged and nasal congestion and her throat is still painful.  States she went to UC in [place], she was out of town and had a negative strep test. States they told her most likely she had a virus at that time and recommended symptom management.  States they prescribedTessalon for her and it was not covered by her insurance.   She is not a smoker.   Treatment to date: Delsym, soups, fluids, Tylenol .  ? sick contacts.  No other aggravating or relieving factors.  No other c/o.  The following portions of the patient's history were reviewed and updated as appropriate: allergies, current medications, past medical history, past social history, past surgical history and problem list.  ROS as in subjective   Objective: Vitals:   07/17/17 1130  BP: 140/82  Pulse: 71  Resp: 16  Temp: 98.4 F (36.9 C)  SpO2: 97%    General appearance: no distress, WD/WN, mildly ill-appearing HEENT: normocephalic, conjunctiva/corneas normal, sclerae anicteric, maxillary sinus tenderness, nares with erythema, edema and thick drainage, pharynx with erythema, without edema or exudate.  Oral cavity: MMM, no lesions  Neck: supple, no lymphadenopathy, no thyromegaly Heart: RRR, normal S1, S2, no murmurs Lungs: CTA bilaterally, no wheezes, rhonchi, or rales   Laboratory Strep test not done. Results:N/A.    Assessment and Plan: Acute non-recurrent pansinusitis - Plan: amoxicillin (AMOXIL) 875 MG tablet  Acute pharyngitis,  unspecified etiology   Advised that symptoms and exam suggests acute sinusitis. Amoxil prescribed.  Discussed symptomatic treatment including salt water gargles, warm fluids, rest, hydrate well, can use over-the-counter Tylenol or ibuprofen for throat pain, fever, or malaise. She will call or return if worse or not back to baseline after completing the antibiotic.

## 2017-07-17 NOTE — Patient Instructions (Signed)
Use salt water gargles, take Tylenol or ibuprofen for pain or fever. Make sure you are hydrated.  You may take Mucinex or Robitussin for cough and congestion.  If you are not back to baseline after completing the antibiotic let me know.

## 2017-08-16 ENCOUNTER — Ambulatory Visit (INDEPENDENT_AMBULATORY_CARE_PROVIDER_SITE_OTHER): Payer: Medicare HMO | Admitting: Family Medicine

## 2017-08-16 ENCOUNTER — Encounter: Payer: Self-pay | Admitting: Family Medicine

## 2017-08-16 VITALS — BP 130/76 | HR 76 | Resp 16 | Wt 157.2 lb

## 2017-08-16 DIAGNOSIS — R030 Elevated blood-pressure reading, without diagnosis of hypertension: Secondary | ICD-10-CM | POA: Diagnosis not present

## 2017-08-16 NOTE — Progress Notes (Signed)
   Subjective:    Patient ID: Marilyn Dixon, female    DOB: [birth year], 66 y.o.   MRN: [id number]  HPI She is here for a recheck. She has had several blood pressure readings recently elevated and is here for a recheck.   Review of Systems     Objective:   Physical Exam Alert and in no distress. Blood pressure is recorded.       Assessment & Plan:  Elevated blood pressure reading  Her blood pressure is now back to normal and I will therefore not pursue this. Did explain that in the future we will need to watch this as she might indeed need to be placed on medication.

## 2017-08-28 ENCOUNTER — Ambulatory Visit: Payer: Medicare HMO | Admitting: Family Medicine

## 2017-09-22 ENCOUNTER — Telehealth: Payer: Self-pay | Admitting: Family Medicine

## 2017-09-22 DIAGNOSIS — F338 Other recurrent depressive disorders: Secondary | ICD-10-CM

## 2017-09-22 DIAGNOSIS — F4322 Adjustment disorder with anxiety: Secondary | ICD-10-CM

## 2017-09-22 MED ORDER — SERTRALINE HCL 100 MG PO TABS: ORAL_TABLET | ORAL | 1 refills | Status: DC

## 2017-09-22 NOTE — Telephone Encounter (Signed)
Pt came in and made a medicare well visit for march. She needs refills of sertraline to last until that appt. Pt has [place]. Pt can be reached at [phone].

## 2017-09-26 DIAGNOSIS — H35363 Drusen (degenerative) of macula, bilateral: Secondary | ICD-10-CM | POA: Diagnosis not present

## 2017-09-26 DIAGNOSIS — H40023 Open angle with borderline findings, high risk, bilateral: Secondary | ICD-10-CM | POA: Diagnosis not present

## 2017-09-26 DIAGNOSIS — H25013 Cortical age-related cataract, bilateral: Secondary | ICD-10-CM | POA: Diagnosis not present

## 2017-09-26 DIAGNOSIS — H2513 Age-related nuclear cataract, bilateral: Secondary | ICD-10-CM | POA: Diagnosis not present

## 2017-10-02 ENCOUNTER — Ambulatory Visit (INDEPENDENT_AMBULATORY_CARE_PROVIDER_SITE_OTHER): Payer: Medicare HMO | Admitting: Licensed Clinical Social Worker

## 2017-10-02 DIAGNOSIS — F3341 Major depressive disorder, recurrent, in partial remission: Secondary | ICD-10-CM

## 2017-10-02 DIAGNOSIS — R69 Illness, unspecified: Secondary | ICD-10-CM | POA: Diagnosis not present

## 2017-10-18 ENCOUNTER — Ambulatory Visit (INDEPENDENT_AMBULATORY_CARE_PROVIDER_SITE_OTHER): Payer: Medicare HMO | Admitting: Licensed Clinical Social Worker

## 2017-10-18 DIAGNOSIS — F3341 Major depressive disorder, recurrent, in partial remission: Secondary | ICD-10-CM

## 2017-10-18 DIAGNOSIS — R69 Illness, unspecified: Secondary | ICD-10-CM | POA: Diagnosis not present

## 2017-10-23 ENCOUNTER — Encounter: Payer: Self-pay | Admitting: Family Medicine

## 2017-10-23 ENCOUNTER — Other Ambulatory Visit: Payer: Self-pay | Admitting: Family Medicine

## 2017-10-23 ENCOUNTER — Ambulatory Visit (INDEPENDENT_AMBULATORY_CARE_PROVIDER_SITE_OTHER): Payer: Medicare HMO | Admitting: Family Medicine

## 2017-10-23 VITALS — BP 136/76 | HR 97 | Wt 157.4 lb

## 2017-10-23 DIAGNOSIS — J069 Acute upper respiratory infection, unspecified: Secondary | ICD-10-CM

## 2017-10-23 DIAGNOSIS — B9789 Other viral agents as the cause of diseases classified elsewhere: Secondary | ICD-10-CM

## 2017-10-23 DIAGNOSIS — E785 Hyperlipidemia, unspecified: Secondary | ICD-10-CM

## 2017-10-23 NOTE — Progress Notes (Signed)
   Subjective:    Patient ID: Marilyn Dixon, female    DOB: [birth year], 67 y.o.   MRN: [id number]  HPI She complains of a 6-day history of rhinorrhea, dry cough, watery eyes, sneezing and weakness.  No sore throat, earache, fever or chills.  She is slightly better today.   Review of Systems     Objective:   Physical Exam Alert and in no distress. Tympanic membranes and canals are normal. Pharyngeal area is normal. Neck is supple without adenopathy or thyromegaly. Cardiac exam shows a regular sinus rhythm without murmurs or gallops. Lungs are clear to auscultation.        Assessment & Plan:  Viral URI with cough Recommend supportive care and to call me in a couple of days if not continuing to improve and I will call in an antibiotic.

## 2017-10-31 ENCOUNTER — Encounter: Payer: Self-pay | Admitting: Family Medicine

## 2017-10-31 ENCOUNTER — Ambulatory Visit (INDEPENDENT_AMBULATORY_CARE_PROVIDER_SITE_OTHER): Payer: Medicare HMO | Admitting: Family Medicine

## 2017-10-31 VITALS — BP 124/82 | HR 88 | Temp 98.1°F | Wt 155.4 lb

## 2017-10-31 DIAGNOSIS — J0100 Acute maxillary sinusitis, unspecified: Secondary | ICD-10-CM | POA: Diagnosis not present

## 2017-10-31 MED ORDER — AMOXICILLIN 875 MG PO TABS: 875.0000 mg | ORAL_TABLET | Freq: Two times a day (BID) | ORAL | 0 refills | Status: DC

## 2017-10-31 NOTE — Progress Notes (Signed)
   Subjective:    Patient ID: Marilyn Dixon, female    DOB: [birth year], 67 y.o.   MRN: [id number]  HPI She is here for a recheck.  She was seen February 4 and treated for a URI.  She did get slightly better however 3 days ago had increased difficulty with sore throat, hoarse voice, purulent postnasal drainage, watery eyes, headache as well as slight upper tooth discomfort malaise and fatigue.  No fever, chills or earache.   Review of Systems     Objective:   Physical Exam Alert and in no distress.  Tender to palpation over maxillary sinuses tympanic membranes and canals are normal. Pharyngeal area is normal. Neck is supple without adenopathy or thyromegaly. Cardiac exam shows a regular sinus rhythm without murmurs or gallops. Lungs are clear to auscultation.       Assessment & Plan:  Acute non-recurrent maxillary sinusitis - Plan: amoxicillin (AMOXIL) 875 MG tablet Encouraged her to call me if not entirely better when she finishes the antibiotic.

## 2017-11-20 ENCOUNTER — Ambulatory Visit: Payer: Medicare HMO | Admitting: Family Medicine

## 2017-12-07 ENCOUNTER — Encounter: Payer: Self-pay | Admitting: Family Medicine

## 2017-12-07 ENCOUNTER — Ambulatory Visit (INDEPENDENT_AMBULATORY_CARE_PROVIDER_SITE_OTHER): Payer: Medicare HMO | Admitting: Family Medicine

## 2017-12-07 VITALS — BP 112/78 | HR 73 | Temp 98.0°F | Ht 64.0 in | Wt 153.4 lb

## 2017-12-07 DIAGNOSIS — E785 Hyperlipidemia, unspecified: Secondary | ICD-10-CM | POA: Diagnosis not present

## 2017-12-07 DIAGNOSIS — F4322 Adjustment disorder with anxiety: Secondary | ICD-10-CM

## 2017-12-07 DIAGNOSIS — F338 Other recurrent depressive disorders: Secondary | ICD-10-CM | POA: Diagnosis not present

## 2017-12-07 DIAGNOSIS — Z86010 Personal history of colonic polyps: Secondary | ICD-10-CM | POA: Diagnosis not present

## 2017-12-07 DIAGNOSIS — H409 Unspecified glaucoma: Secondary | ICD-10-CM

## 2017-12-07 DIAGNOSIS — K219 Gastro-esophageal reflux disease without esophagitis: Secondary | ICD-10-CM | POA: Diagnosis not present

## 2017-12-07 DIAGNOSIS — J301 Allergic rhinitis due to pollen: Secondary | ICD-10-CM | POA: Diagnosis not present

## 2017-12-07 DIAGNOSIS — N951 Menopausal and female climacteric states: Secondary | ICD-10-CM

## 2017-12-07 DIAGNOSIS — R69 Illness, unspecified: Secondary | ICD-10-CM | POA: Diagnosis not present

## 2017-12-07 DIAGNOSIS — F419 Anxiety disorder, unspecified: Secondary | ICD-10-CM

## 2017-12-07 DIAGNOSIS — H269 Unspecified cataract: Secondary | ICD-10-CM

## 2017-12-07 MED ORDER — SERTRALINE HCL 100 MG PO TABS: ORAL_TABLET | ORAL | 3 refills | Status: DC

## 2017-12-07 MED ORDER — CLONAZEPAM 0.5 MG PO TABS: 0.5000 mg | ORAL_TABLET | Freq: Two times a day (BID) | ORAL | 1 refills | Status: DC | PRN

## 2017-12-07 MED ORDER — ATORVASTATIN CALCIUM 10 MG PO TABS: ORAL_TABLET | ORAL | 3 refills | Status: DC

## 2017-12-07 NOTE — Progress Notes (Signed)
Marilyn Dixon is a 67 y.o. female who presents for annual wellness visit and follow-up on chronic medical conditions.  She has the following concerns: She is followed by her ophthalmologist for her glaucoma as well as cataracts.  At this time no surgery is needed.  She continues on sertraline and would like a refill on Klonopin to help out when she is under more stress and anxiety.  Her allergies seem to be under good control.  She does have history of colonic polyps and will need a colonoscopy in the next couple of years.  She also has hyperlipidemia however is not taking any medication for that.  She continues on black cohosh to help with her hot flashes.  Reflux is causing very little difficulty.  Life in general is going quite well for her.  She does exercise fairly regularly.  Immunizations and Health Maintenance Immunization History  Administered Date(s) Administered  . DT 06/27/1997  . Hepatitis A 06/19/2007, 07/22/2010  . Influenza Split 09/23/1999, 07/20/2012, 06/20/2013  . Influenza Whole 09/24/2002, 07/17/2009, 05/09/2010  . Influenza, High Dose Seasonal PF 06/22/2015, 07/04/2016, 06/07/2017  . Influenza,inj,Quad PF,6+ Mos 05/20/2014  . Pneumococcal Conjugate-13 07/29/2013  . Pneumococcal Polysaccharide-23 05/11/2005  . Tdap 06/19/2007, 01/14/2011  . Zoster 07/22/2010   Health Maintenance Due  Topic Date Due  . PNA vac Low Risk Adult (2 of 2 - PPSV23) 03/31/2015  . MAMMOGRAM  09/23/2017    Last Pap smear:three years ago Last mammogram: dec 2018 Last colonoscopy: last year Last DEXA: three years ago Dentist:six months ago Ophtho: last year Exercise: Member at [facility] and walking for about 45 min   Other doctors caring for patient include:Weaver Advanced directives:    Depression screen:  See questionnaire below.  Depression screen [facility] 2/9 12/07/2017 08/25/2016 08/25/2016 08/18/2014  Decreased Interest 0 1 0 0  Down, Depressed, Hopeless 0 1 0 0  PHQ - 2 Score 0 2 0 0     Fall Risk Screen: see questionnaire below. Fall Risk  12/07/2017 08/25/2016 08/18/2014  Falls in the past year? No No No    ADL screen:  See questionnaire below Functional Status Survey:     Review of Systems Constitutional: -, -unexpected weight change, -anorexia, -fatigue Allergy: -sneezing, -itching, -congestion Dermatology: denies changing moles, rash, lumps ENT: -runny nose, -ear pain, -sore throat,  Cardiology:  -chest pain, -palpitations, -orthopnea, Respiratory: -cough, -shortness of breath, -dyspnea on exertion, -wheezing,  Gastroenterology: -abdominal pain, -nausea, -vomiting, -diarrhea, -constipation, -dysphagia Hematology: -bleeding or bruising problems Musculoskeletal: -arthralgias, -myalgias, -joint swelling, -back pain, - Ophthalmology: -vision changes,  Urology: -dysuria, -difficulty urinating,  -urinary frequency, -urgency, incontinence Neurology: -, -numbness, , -memory loss, -falls, -dizziness    PHYSICAL EXAM:  There were no vitals taken for this visit.  General Appearance: Alert, cooperative, no distress, appears stated age Head: Normocephalic, without obvious abnormality, atraumatic Eyes: PERRL, conjunctiva/corneas clear, EOM's intact, fundi benign Ears: Normal TM's and external ear canals Nose: Nares normal, mucosa normal, no drainage or sinus tenderness Throat: Lips, mucosa, and tongue normal; teeth and gums normal Neck: Supple, no lymphadenopathy;  thyroid:  no enlargement/tenderness/nodules; no carotid bruit or JVD Lungs: Clear to auscultation bilaterally without wheezes, rales or ronchi; respirations unlabored Heart: Regular rate and rhythm, S1 and S2 normal, no murmur, rubor gallop Abdomen: Soft, non-tender, nondistended, normoactive bowel sounds,  no masses, no hepatosplenomegaly Extremities: No clubbing, cyanosis or edema Pulses: 2+ and symmetric all extremities Skin:  Skin color, texture, turgor normal, no rashes or lesions Lymph nodes:  Cervical, supraclavicular, and axillary nodes normal Neurologic:  CNII-XII intact, normal strength, sensation and gait; reflexes 2+ and symmetric throughout Psych: Normal mood, affect, hygiene and grooming.  ASSESSMENT/PLAN: Seasonal affective disorder ([place]) - Plan: CBC with Differential/Platelet, Comprehensive metabolic panel  Non-seasonal allergic rhinitis due to pollen - Plan: CBC with Differential/Platelet, Comprehensive metabolic panel  Glaucoma, unspecified glaucoma type, unspecified laterality  History of colonic polyps  Hyperlipidemia LDL goal <100 - Plan: Lipid panel  Postmenopausal disorder - Plan: CBC with Differential/Platelet, Comprehensive metabolic panel  Gastroesophageal reflux disease without esophagitis  Cataract of both eyes, unspecified cataract type  I encouraged her to continue to take good care of herself.  Medicare Attestation I have personally reviewed: The patient's medical and social history Their use of alcohol, tobacco or illicit drugs Their current medications and supplements The patient's functional ability including ADLs,fall risks, home safety risks, cognitive, and hearing and visual impairment Diet and physical activities Evidence for depression or mood disorders  The patient's weight, height, and BMI have been recorded in the chart.  I have made referrals, counseling, and provided education to the patient based on review of the above and I have provided the patient with a written personalized care plan for preventive services.     Jill Alexanders, MD   12/07/2017

## 2017-12-08 LAB — LIPID PANEL
CHOL/HDL RATIO: 2.6 ratio (ref 0.0–4.4)
CHOLESTEROL TOTAL: 183 mg/dL (ref 100–199)
HDL: 70 mg/dL (ref >39)
LDL CALC: 93 mg/dL (ref 0–99)
TRIGLYCERIDES: 100 mg/dL (ref 0–149)
VLDL CHOLESTEROL CAL: 20 mg/dL (ref 5–40)

## 2017-12-08 LAB — CBC WITH DIFFERENTIAL/PLATELET
BASOS ABS: 0 10*3/uL (ref 0.0–0.2)
Basos: 0 %
EOS (ABSOLUTE): 0.1 10*3/uL (ref 0.0–0.4)
EOS: 1 %
HEMATOCRIT: 45.2 % (ref 34.0–46.6)
Hemoglobin: 15.3 g/dL (ref 11.1–15.9)
IMMATURE GRANULOCYTES: 0 %
Immature Grans (Abs): 0 10*3/uL (ref 0.0–0.1)
LYMPHS ABS: 1.1 10*3/uL (ref 0.7–3.1)
LYMPHS: 19 %
MCH: 30.7 pg (ref 26.6–33.0)
MCHC: 33.8 g/dL (ref 31.5–35.7)
MCV: 91 fL (ref 79–97)
MONOCYTES: 6 %
Monocytes Absolute: 0.3 10*3/uL (ref 0.1–0.9)
NEUTROS PCT: 74 %
Neutrophils Absolute: 4.4 10*3/uL (ref 1.4–7.0)
Platelets: 259 10*3/uL (ref 150–379)
RBC: 4.99 x10E6/uL (ref 3.77–5.28)
RDW: 13.7 % (ref 12.3–15.4)
WBC: 5.9 10*3/uL (ref 3.4–10.8)

## 2017-12-08 LAB — COMPREHENSIVE METABOLIC PANEL
ALBUMIN: 4.6 g/dL (ref 3.6–4.8)
ALK PHOS: 136 IU/L — AB (ref 39–117)
ALT: 31 IU/L (ref 0–32)
AST: 35 IU/L (ref 0–40)
Albumin/Globulin Ratio: 2 (ref 1.2–2.2)
BUN/Creatinine Ratio: 16 (ref 12–28)
BUN: 14 mg/dL (ref 8–27)
Bilirubin Total: 0.5 mg/dL (ref 0.0–1.2)
CALCIUM: 10 mg/dL (ref 8.7–10.3)
CO2: 24 mmol/L (ref 20–29)
CREATININE: 0.85 mg/dL (ref 0.57–1.00)
Chloride: 105 mmol/L (ref 96–106)
GFR calc Af Amer: 82 mL/min/{1.73_m2} (ref >59)
GFR, EST NON AFRICAN AMERICAN: 71 mL/min/{1.73_m2} (ref >59)
GLOBULIN, TOTAL: 2.3 g/dL (ref 1.5–4.5)
GLUCOSE: 96 mg/dL (ref 65–99)
Potassium: 4.5 mmol/L (ref 3.5–5.2)
Sodium: 144 mmol/L (ref 134–144)
Total Protein: 6.9 g/dL (ref 6.0–8.5)

## 2017-12-12 ENCOUNTER — Other Ambulatory Visit: Payer: Self-pay

## 2017-12-12 ENCOUNTER — Telehealth: Payer: Self-pay

## 2017-12-12 ENCOUNTER — Telehealth: Payer: Self-pay | Admitting: Family Medicine

## 2017-12-12 DIAGNOSIS — F419 Anxiety disorder, unspecified: Secondary | ICD-10-CM

## 2017-12-12 DIAGNOSIS — F4322 Adjustment disorder with anxiety: Secondary | ICD-10-CM

## 2017-12-12 DIAGNOSIS — E785 Hyperlipidemia, unspecified: Secondary | ICD-10-CM

## 2017-12-12 DIAGNOSIS — F338 Other recurrent depressive disorders: Secondary | ICD-10-CM

## 2017-12-12 MED ORDER — ATORVASTATIN CALCIUM 10 MG PO TABS: ORAL_TABLET | ORAL | 3 refills | Status: DC

## 2017-12-12 NOTE — Telephone Encounter (Signed)
New Message  [facility] through [facility] requesting new rx for atorvastatin.  Please f/u

## 2017-12-12 NOTE — Telephone Encounter (Signed)
[facility] [place] . Med that is needed is clonazepam and sertraline. Please advise [facility]

## 2017-12-13 MED ORDER — CLONAZEPAM 0.5 MG PO TABS: 0.5000 mg | ORAL_TABLET | Freq: Two times a day (BID) | ORAL | 1 refills | Status: DC | PRN

## 2017-12-13 MED ORDER — SERTRALINE HCL 100 MG PO TABS: ORAL_TABLET | ORAL | 3 refills | Status: DC

## 2017-12-13 NOTE — Addendum Note (Signed)
Addended by: Denita Lung on: 12/13/2017 08:07 AM   Modules accepted: Orders

## 2017-12-19 ENCOUNTER — Ambulatory Visit (INDEPENDENT_AMBULATORY_CARE_PROVIDER_SITE_OTHER): Payer: Medicare HMO | Admitting: Licensed Clinical Social Worker

## 2017-12-19 DIAGNOSIS — F3341 Major depressive disorder, recurrent, in partial remission: Secondary | ICD-10-CM | POA: Diagnosis not present

## 2017-12-19 DIAGNOSIS — R69 Illness, unspecified: Secondary | ICD-10-CM | POA: Diagnosis not present

## 2018-01-03 DIAGNOSIS — Z87891 Personal history of nicotine dependence: Secondary | ICD-10-CM | POA: Diagnosis not present

## 2018-01-03 DIAGNOSIS — H04129 Dry eye syndrome of unspecified lacrimal gland: Secondary | ICD-10-CM | POA: Diagnosis not present

## 2018-01-03 DIAGNOSIS — Z8249 Family history of ischemic heart disease and other diseases of the circulatory system: Secondary | ICD-10-CM | POA: Diagnosis not present

## 2018-01-03 DIAGNOSIS — E785 Hyperlipidemia, unspecified: Secondary | ICD-10-CM | POA: Diagnosis not present

## 2018-01-03 DIAGNOSIS — Z823 Family history of stroke: Secondary | ICD-10-CM | POA: Diagnosis not present

## 2018-01-03 DIAGNOSIS — Z818 Family history of other mental and behavioral disorders: Secondary | ICD-10-CM | POA: Diagnosis not present

## 2018-01-03 DIAGNOSIS — J302 Other seasonal allergic rhinitis: Secondary | ICD-10-CM | POA: Diagnosis not present

## 2018-01-03 DIAGNOSIS — F419 Anxiety disorder, unspecified: Secondary | ICD-10-CM | POA: Diagnosis not present

## 2018-01-03 DIAGNOSIS — Z833 Family history of diabetes mellitus: Secondary | ICD-10-CM | POA: Diagnosis not present

## 2018-01-03 DIAGNOSIS — R69 Illness, unspecified: Secondary | ICD-10-CM | POA: Diagnosis not present

## 2018-01-24 ENCOUNTER — Telehealth: Payer: Self-pay

## 2018-01-24 DIAGNOSIS — F419 Anxiety disorder, unspecified: Secondary | ICD-10-CM

## 2018-01-24 MED ORDER — CLONAZEPAM 0.5 MG PO TABS: 0.5000 mg | ORAL_TABLET | Freq: Two times a day (BID) | ORAL | 1 refills | Status: DC | PRN

## 2018-01-24 NOTE — Telephone Encounter (Signed)
Pharmacy is requesting to fill pt cloazepam . [facility] [address] . Please advise [facility]

## 2018-03-13 ENCOUNTER — Other Ambulatory Visit: Payer: Self-pay | Admitting: Family Medicine

## 2018-03-13 DIAGNOSIS — F4322 Adjustment disorder with anxiety: Secondary | ICD-10-CM

## 2018-03-13 DIAGNOSIS — F338 Other recurrent depressive disorders: Secondary | ICD-10-CM

## 2018-03-13 NOTE — Telephone Encounter (Signed)
[facility] Please advise [facility]

## 2018-03-26 DIAGNOSIS — H25013 Cortical age-related cataract, bilateral: Secondary | ICD-10-CM | POA: Diagnosis not present

## 2018-03-26 DIAGNOSIS — H2512 Age-related nuclear cataract, left eye: Secondary | ICD-10-CM | POA: Diagnosis not present

## 2018-03-26 DIAGNOSIS — H35363 Drusen (degenerative) of macula, bilateral: Secondary | ICD-10-CM | POA: Diagnosis not present

## 2018-03-26 DIAGNOSIS — H2513 Age-related nuclear cataract, bilateral: Secondary | ICD-10-CM | POA: Diagnosis not present

## 2018-03-26 DIAGNOSIS — H40023 Open angle with borderline findings, high risk, bilateral: Secondary | ICD-10-CM | POA: Diagnosis not present

## 2018-05-09 DIAGNOSIS — H2512 Age-related nuclear cataract, left eye: Secondary | ICD-10-CM | POA: Diagnosis not present

## 2018-05-11 DIAGNOSIS — H25011 Cortical age-related cataract, right eye: Secondary | ICD-10-CM | POA: Diagnosis not present

## 2018-05-11 DIAGNOSIS — H2511 Age-related nuclear cataract, right eye: Secondary | ICD-10-CM | POA: Diagnosis not present

## 2018-05-23 DIAGNOSIS — H25811 Combined forms of age-related cataract, right eye: Secondary | ICD-10-CM | POA: Diagnosis not present

## 2018-05-23 DIAGNOSIS — H2511 Age-related nuclear cataract, right eye: Secondary | ICD-10-CM | POA: Diagnosis not present

## 2018-06-14 ENCOUNTER — Telehealth: Payer: Self-pay | Admitting: Family Medicine

## 2018-06-14 NOTE — Telephone Encounter (Signed)
Pt came in and dropped off papers to be completed for an upcoming trip. Sending back to be completed. Please call pt at [phone] when ready.

## 2018-06-21 ENCOUNTER — Telehealth: Payer: Self-pay

## 2018-06-21 NOTE — Telephone Encounter (Signed)
Called pt to advise of paperwork pick up. LVM Marilyn Dixon

## 2018-07-18 ENCOUNTER — Ambulatory Visit (INDEPENDENT_AMBULATORY_CARE_PROVIDER_SITE_OTHER): Payer: Medicare HMO | Admitting: Licensed Clinical Social Worker

## 2018-07-18 DIAGNOSIS — F3341 Major depressive disorder, recurrent, in partial remission: Secondary | ICD-10-CM

## 2018-07-18 DIAGNOSIS — R69 Illness, unspecified: Secondary | ICD-10-CM | POA: Diagnosis not present

## 2018-08-03 DIAGNOSIS — R69 Illness, unspecified: Secondary | ICD-10-CM | POA: Diagnosis not present

## 2018-08-14 ENCOUNTER — Ambulatory Visit (INDEPENDENT_AMBULATORY_CARE_PROVIDER_SITE_OTHER): Payer: Medicare HMO | Admitting: Licensed Clinical Social Worker

## 2018-08-14 DIAGNOSIS — F3341 Major depressive disorder, recurrent, in partial remission: Secondary | ICD-10-CM

## 2018-08-14 DIAGNOSIS — R69 Illness, unspecified: Secondary | ICD-10-CM | POA: Diagnosis not present

## 2018-09-04 ENCOUNTER — Ambulatory Visit (INDEPENDENT_AMBULATORY_CARE_PROVIDER_SITE_OTHER): Payer: Medicare HMO | Admitting: Licensed Clinical Social Worker

## 2018-09-04 DIAGNOSIS — F3341 Major depressive disorder, recurrent, in partial remission: Secondary | ICD-10-CM | POA: Diagnosis not present

## 2018-09-04 DIAGNOSIS — R69 Illness, unspecified: Secondary | ICD-10-CM | POA: Diagnosis not present

## 2018-09-05 ENCOUNTER — Telehealth: Payer: Self-pay | Admitting: Family Medicine

## 2018-09-05 DIAGNOSIS — F338 Other recurrent depressive disorders: Secondary | ICD-10-CM

## 2018-09-05 DIAGNOSIS — F4322 Adjustment disorder with anxiety: Secondary | ICD-10-CM

## 2018-09-05 MED ORDER — SERTRALINE HCL 100 MG PO TABS: ORAL_TABLET | ORAL | 3 refills | Status: DC

## 2018-09-05 NOTE — Telephone Encounter (Signed)
Canceled the Zoloft at [employer]

## 2018-09-05 NOTE — Telephone Encounter (Signed)
Canceled zoloft by calling in per JCL. [place]

## 2018-09-05 NOTE — Addendum Note (Signed)
Addended by: Denita Lung on: 09/05/2018 03:53 PM   Modules accepted: Orders

## 2018-09-05 NOTE — Addendum Note (Signed)
Addended by: Denita Lung on: 09/05/2018 03:55 PM   Modules accepted: Orders

## 2018-09-05 NOTE — Telephone Encounter (Signed)
Pt called and is requesting a refill on her Zoloft 100 mg pt is leaving sat dec 21 to go out of town needs before then pt would [id number] [place] [address] AT [address]

## 2018-10-03 ENCOUNTER — Ambulatory Visit (INDEPENDENT_AMBULATORY_CARE_PROVIDER_SITE_OTHER): Payer: Medicare HMO | Admitting: Licensed Clinical Social Worker

## 2018-10-03 DIAGNOSIS — F3341 Major depressive disorder, recurrent, in partial remission: Secondary | ICD-10-CM | POA: Diagnosis not present

## 2018-10-03 DIAGNOSIS — R69 Illness, unspecified: Secondary | ICD-10-CM | POA: Diagnosis not present

## 2018-10-10 ENCOUNTER — Ambulatory Visit (INDEPENDENT_AMBULATORY_CARE_PROVIDER_SITE_OTHER): Payer: Medicare HMO | Admitting: Licensed Clinical Social Worker

## 2018-10-10 DIAGNOSIS — F3341 Major depressive disorder, recurrent, in partial remission: Secondary | ICD-10-CM

## 2018-10-10 DIAGNOSIS — R69 Illness, unspecified: Secondary | ICD-10-CM | POA: Diagnosis not present

## 2018-10-11 ENCOUNTER — Other Ambulatory Visit: Payer: Self-pay | Admitting: Family Medicine

## 2018-10-11 DIAGNOSIS — F419 Anxiety disorder, unspecified: Secondary | ICD-10-CM

## 2018-10-12 NOTE — Telephone Encounter (Signed)
[facility]opin is [facility] Please advise [facility]

## 2018-10-31 ENCOUNTER — Ambulatory Visit: Payer: Medicare HMO | Admitting: Licensed Clinical Social Worker

## 2018-11-20 ENCOUNTER — Ambulatory Visit (INDEPENDENT_AMBULATORY_CARE_PROVIDER_SITE_OTHER): Payer: Medicare HMO | Admitting: Licensed Clinical Social Worker

## 2018-11-20 DIAGNOSIS — F3341 Major depressive disorder, recurrent, in partial remission: Secondary | ICD-10-CM

## 2018-11-20 DIAGNOSIS — R69 Illness, unspecified: Secondary | ICD-10-CM | POA: Diagnosis not present

## 2018-12-11 ENCOUNTER — Ambulatory Visit (INDEPENDENT_AMBULATORY_CARE_PROVIDER_SITE_OTHER): Payer: Medicare HMO | Admitting: Family Medicine

## 2018-12-11 ENCOUNTER — Other Ambulatory Visit: Payer: Self-pay

## 2018-12-11 ENCOUNTER — Encounter: Payer: Self-pay | Admitting: Family Medicine

## 2018-12-11 VITALS — BP 118/80 | HR 81 | Temp 98.1°F | Ht 64.0 in | Wt 149.2 lb

## 2018-12-11 DIAGNOSIS — H409 Unspecified glaucoma: Secondary | ICD-10-CM | POA: Diagnosis not present

## 2018-12-11 DIAGNOSIS — J301 Allergic rhinitis due to pollen: Secondary | ICD-10-CM | POA: Diagnosis not present

## 2018-12-11 DIAGNOSIS — F4322 Adjustment disorder with anxiety: Secondary | ICD-10-CM

## 2018-12-11 DIAGNOSIS — E785 Hyperlipidemia, unspecified: Secondary | ICD-10-CM | POA: Diagnosis not present

## 2018-12-11 DIAGNOSIS — Z86010 Personal history of colonic polyps: Secondary | ICD-10-CM

## 2018-12-11 DIAGNOSIS — F338 Other recurrent depressive disorders: Secondary | ICD-10-CM

## 2018-12-11 DIAGNOSIS — R69 Illness, unspecified: Secondary | ICD-10-CM | POA: Diagnosis not present

## 2018-12-11 DIAGNOSIS — Z79899 Other long term (current) drug therapy: Secondary | ICD-10-CM

## 2018-12-11 DIAGNOSIS — Z9849 Cataract extraction status, unspecified eye: Secondary | ICD-10-CM | POA: Diagnosis not present

## 2018-12-11 DIAGNOSIS — K219 Gastro-esophageal reflux disease without esophagitis: Secondary | ICD-10-CM

## 2018-12-11 MED ORDER — SERTRALINE HCL 100 MG PO TABS: ORAL_TABLET | ORAL | 3 refills | Status: DC

## 2018-12-11 NOTE — Progress Notes (Signed)
Marilyn Dixon is a 68 y.o. female who presents for annual wellness visit and follow-up on chronic medical conditions.  She has allergic rhinitis and has this under control with Zyrtec.  She has had bilateral cataract surgery and is quite happy with that.  Her reflux is under good control with Prilosec on an as-needed basis.  She does have seasonal affective disorder and does do well on her sertraline.  She rarely uses Klonopin.  She sees Melinda Crutch every few months for counseling.  Review of her record indicates she does have hyperlipidemia. Immunizations and Health Maintenance Immunization History  Administered Date(s) Administered  . DT 06/27/1997  . Hepatitis A 06/19/2007, 07/22/2010  . Influenza Split 09/23/1999, 07/20/2012, 06/20/2013  . Influenza Whole 09/24/2002, 07/17/2009, 05/09/2010  . Influenza, High Dose Seasonal PF 06/22/2015, 07/04/2016, 06/07/2017  . Influenza,inj,Quad PF,6+ Mos 05/20/2014  . Influenza-Unspecified 08/03/2018  . Pneumococcal Conjugate-13 07/29/2013  . Pneumococcal Polysaccharide-23 05/11/2005  . Tdap 06/19/2007, 01/14/2011  . Zoster 07/22/2010   Health Maintenance Due  Topic Date Due  . MAMMOGRAM  09/23/2017    Last Pap smear: aged out  Last mammogram:09-24-15 Last colonoscopy:05/27/15 Last DEXA:07-14-16 Dentist: Q six months Ophtho: 05/2018 Exercise: walking and weights  Other doctors caring for patient include: Dr.weaver eye, Dr. Michail Sermon GI, Dr. Orland Mustard dentist  Advanced directives:Does Patient Have a Medical Advance Directive?: Yes Type of Advance Directive: [place] will Does patient want to make changes to medical advance directive?: No - Patient declined Copy of [place] in Chart?: Yes - validated most recent copy scanned in chart (See row information)  Depression screen:  See questionnaire below.  Depression screen [facility] 2/9 12/11/2018 12/07/2017 08/25/2016 08/25/2016 08/18/2014  Decreased Interest 0 0 1  0 0  Down, Depressed, Hopeless 0 0 1 0 0  PHQ - 2 Score 0 0 2 0 0    Fall Risk Screen: see questionnaire below. Fall Risk  12/11/2018 12/07/2017 08/25/2016 08/18/2014  Falls in the past year? 0 No No No    ADL screen:  See questionnaire below Functional Status Survey: Is the patient deaf or have difficulty hearing?: No Does the patient have difficulty seeing, even when wearing glasses/contacts?: No Does the patient have difficulty concentrating, remembering, or making decisions?: No Does the patient have difficulty walking or climbing stairs?: No Does the patient have difficulty dressing or bathing?: No Does the patient have difficulty doing errands alone such as visiting a doctor's office or shopping?: No   Review of Systems Constitutional: -, -unexpected weight change, -anorexia, -fatigue Allergy: -sneezing, -itching, -congestion Dermatology: denies changing moles, rash, lumps ENT: -runny nose, -ear pain, -sore throat,  Cardiology:  -chest pain, -palpitations, -orthopnea, Respiratory: -cough, -shortness of breath, -dyspnea on exertion, -wheezing,  Gastroenterology: -abdominal pain, -nausea, -vomiting, -diarrhea, -constipation, -dysphagia Hematology: -bleeding or bruising problems Musculoskeletal: -arthralgias, -myalgias, -joint swelling, -back pain, - Ophthalmology: -vision changes,  Urology: -dysuria, -difficulty urinating,  -urinary frequency, -urgency, incontinence Neurology: -, -numbness, , -memory loss, -falls, -dizziness    PHYSICAL EXAM:  BP 118/80 (BP Location: Left Arm, Patient Position: Sitting)   Pulse 81   Temp 98.1 F (36.7 C)   Ht 5\' 4"  (1.626 m)   Wt 149 lb 3.2 oz (67.7 kg)   SpO2 97%   BMI 25.61 kg/m   General Appearance: Alert, cooperative, no distress, appears stated age Head: Normocephalic, without obvious abnormality, atraumatic Eyes: PERRL, conjunctiva/corneas clear, EOM's intact, fundi benign Ears: Normal TM's and external ear canals Nose:  Nares normal, mucosa normal, no drainage or sinus tenderness Throat: Lips, mucosa, and tongue normal; teeth and gums normal Neck: Supple, no lymphadenopathy;  thyroid:  no enlargement/tenderness/nodules; no carotid bruit or JVD Lungs: Clear to auscultation bilaterally without wheezes, rales or ronchi; respirations unlabored Heart: Regular rate and rhythm, S1 and S2 normal, no murmur, rubor gallop  Extremities: No clubbing, cyanosis or edema Pulses: 2+ and symmetric all extremities Skin:  Skin color, texture, turgor normal, no rashes or lesions Lymph nodes: Cervical, supraclavicular, and axillary nodes normal Neurologic:  CNII-XII intact, normal strength, sensation and gait; reflexes 2+ and symmetric throughout Psych: Normal mood, affect, hygiene and grooming.  ASSESSMENT/PLAN: Non-seasonal allergic rhinitis due to pollen  Gastroesophageal reflux disease without esophagitis - Plan: CBC with Differential/Platelet, Comprehensive metabolic panel  Glaucoma, unspecified glaucoma type, unspecified laterality  History of colonic polyps  Hyperlipidemia LDL goal <100 - Plan: Lipid panel  Seasonal affective disorder ([place]) - Plan: sertraline (ZOLOFT) 100 MG tablet  Encounter for long-term (current) use of medications - Plan: CBC with Differential/Platelet, Comprehensive metabolic panel  Status post cataract extraction, unspecified laterality  Reaction, adjustment, with anxious mood - Plan: sertraline (ZOLOFT) 100 MG tablet  Discussed monthly self breast exams and yearly mammograms; she will set up an appointment for her mammogram at least 30 minutes of aerobic activity at least 5 days/week and weight-bearing exercise 2x/week; proper sunscreen use reviewed; healthy diet, including goals of calcium and vitamin D intake and alcohol recommendations (less than or equal to 1 drink/day) reviewed; regular seatbelt use; changing batteries in smoke detectors.  Immunization recommendations discussed.   Colonoscopy recommendations reviewed   Medicare Attestation I have personally reviewed: The patient's medical and social history Their use of alcohol, tobacco or illicit drugs Their current medications and supplements The patient's functional ability including ADLs,fall risks, home safety risks, cognitive, and hearing and visual impairment Diet and physical activities Evidence for depression or mood disorders  The patient's weight, height, and BMI have been recorded in the chart.  I have made referrals, counseling, and provided education to the patient based on review of the above and I have provided the patient with a written personalized care plan for preventive services.     Jill Alexanders, MD   12/11/2018

## 2018-12-12 LAB — COMPREHENSIVE METABOLIC PANEL
A/G RATIO: 2.3 — AB (ref 1.2–2.2)
ALT: 16 IU/L (ref 0–32)
AST: 19 IU/L (ref 0–40)
Albumin: 4.4 g/dL (ref 3.8–4.8)
Alkaline Phosphatase: 125 IU/L — ABNORMAL HIGH (ref 39–117)
BILIRUBIN TOTAL: 0.4 mg/dL (ref 0.0–1.2)
BUN / CREAT RATIO: 14 (ref 12–28)
BUN: 13 mg/dL (ref 8–27)
CO2: 24 mmol/L (ref 20–29)
Calcium: 9.9 mg/dL (ref 8.7–10.3)
Chloride: 104 mmol/L (ref 96–106)
Creatinine, Ser: 0.9 mg/dL (ref 0.57–1.00)
GFR calc non Af Amer: 66 mL/min/{1.73_m2} (ref >59)
GFR, EST AFRICAN AMERICAN: 76 mL/min/{1.73_m2} (ref >59)
GLOBULIN, TOTAL: 1.9 g/dL (ref 1.5–4.5)
Glucose: 96 mg/dL (ref 65–99)
POTASSIUM: 4.9 mmol/L (ref 3.5–5.2)
SODIUM: 143 mmol/L (ref 134–144)
TOTAL PROTEIN: 6.3 g/dL (ref 6.0–8.5)

## 2018-12-12 LAB — CBC WITH DIFFERENTIAL/PLATELET
Basophils Absolute: 0 10*3/uL (ref 0.0–0.2)
Basos: 1 %
EOS (ABSOLUTE): 0.2 10*3/uL (ref 0.0–0.4)
EOS: 3 %
HEMATOCRIT: 45.4 % (ref 34.0–46.6)
Hemoglobin: 15.3 g/dL (ref 11.1–15.9)
IMMATURE GRANULOCYTES: 0 %
Immature Grans (Abs): 0 10*3/uL (ref 0.0–0.1)
Lymphocytes Absolute: 1.7 10*3/uL (ref 0.7–3.1)
Lymphs: 27 %
MCH: 30.1 pg (ref 26.6–33.0)
MCHC: 33.7 g/dL (ref 31.5–35.7)
MCV: 89 fL (ref 79–97)
MONOCYTES: 7 %
Monocytes Absolute: 0.5 10*3/uL (ref 0.1–0.9)
Neutrophils Absolute: 3.9 10*3/uL (ref 1.4–7.0)
Neutrophils: 62 %
PLATELETS: 258 10*3/uL (ref 150–450)
RBC: 5.09 x10E6/uL (ref 3.77–5.28)
RDW: 12.2 % (ref 11.7–15.4)
WBC: 6.3 10*3/uL (ref 3.4–10.8)

## 2018-12-12 LAB — LIPID PANEL
CHOL/HDL RATIO: 3.3 ratio (ref 0.0–4.4)
Cholesterol, Total: 194 mg/dL (ref 100–199)
HDL: 58 mg/dL (ref >39)
LDL CALC: 102 mg/dL — AB (ref 0–99)
Triglycerides: 171 mg/dL — ABNORMAL HIGH (ref 0–149)
VLDL Cholesterol Cal: 34 mg/dL (ref 5–40)

## 2018-12-26 ENCOUNTER — Other Ambulatory Visit: Payer: Self-pay | Admitting: Family Medicine

## 2018-12-26 DIAGNOSIS — E785 Hyperlipidemia, unspecified: Secondary | ICD-10-CM

## 2019-01-03 DIAGNOSIS — H35363 Drusen (degenerative) of macula, bilateral: Secondary | ICD-10-CM | POA: Diagnosis not present

## 2019-01-03 DIAGNOSIS — H40023 Open angle with borderline findings, high risk, bilateral: Secondary | ICD-10-CM | POA: Diagnosis not present

## 2019-01-03 DIAGNOSIS — Z961 Presence of intraocular lens: Secondary | ICD-10-CM | POA: Diagnosis not present

## 2019-01-03 DIAGNOSIS — H04123 Dry eye syndrome of bilateral lacrimal glands: Secondary | ICD-10-CM | POA: Diagnosis not present

## 2019-01-15 ENCOUNTER — Ambulatory Visit (INDEPENDENT_AMBULATORY_CARE_PROVIDER_SITE_OTHER): Payer: Medicare HMO | Admitting: Licensed Clinical Social Worker

## 2019-01-15 DIAGNOSIS — F3341 Major depressive disorder, recurrent, in partial remission: Secondary | ICD-10-CM | POA: Diagnosis not present

## 2019-01-15 DIAGNOSIS — R69 Illness, unspecified: Secondary | ICD-10-CM | POA: Diagnosis not present

## 2019-02-19 ENCOUNTER — Ambulatory Visit (INDEPENDENT_AMBULATORY_CARE_PROVIDER_SITE_OTHER): Payer: Medicare HMO | Admitting: Licensed Clinical Social Worker

## 2019-02-19 DIAGNOSIS — F3341 Major depressive disorder, recurrent, in partial remission: Secondary | ICD-10-CM | POA: Diagnosis not present

## 2019-02-19 DIAGNOSIS — R69 Illness, unspecified: Secondary | ICD-10-CM | POA: Diagnosis not present

## 2019-03-26 ENCOUNTER — Ambulatory Visit (INDEPENDENT_AMBULATORY_CARE_PROVIDER_SITE_OTHER): Payer: Medicare HMO | Admitting: Licensed Clinical Social Worker

## 2019-03-26 DIAGNOSIS — F3341 Major depressive disorder, recurrent, in partial remission: Secondary | ICD-10-CM

## 2019-03-26 DIAGNOSIS — R69 Illness, unspecified: Secondary | ICD-10-CM | POA: Diagnosis not present

## 2019-04-02 DIAGNOSIS — R69 Illness, unspecified: Secondary | ICD-10-CM | POA: Diagnosis not present

## 2019-04-30 ENCOUNTER — Ambulatory Visit (INDEPENDENT_AMBULATORY_CARE_PROVIDER_SITE_OTHER): Payer: Medicare HMO | Admitting: Licensed Clinical Social Worker

## 2019-04-30 DIAGNOSIS — F3341 Major depressive disorder, recurrent, in partial remission: Secondary | ICD-10-CM

## 2019-04-30 DIAGNOSIS — R69 Illness, unspecified: Secondary | ICD-10-CM | POA: Diagnosis not present

## 2019-05-23 ENCOUNTER — Other Ambulatory Visit: Payer: Self-pay | Admitting: Family Medicine

## 2019-05-23 DIAGNOSIS — F419 Anxiety disorder, unspecified: Secondary | ICD-10-CM

## 2019-05-23 NOTE — Telephone Encounter (Signed)
Is this okay to refill? 

## 2019-05-29 ENCOUNTER — Ambulatory Visit (INDEPENDENT_AMBULATORY_CARE_PROVIDER_SITE_OTHER): Payer: Medicare HMO | Admitting: Licensed Clinical Social Worker

## 2019-05-29 DIAGNOSIS — F3341 Major depressive disorder, recurrent, in partial remission: Secondary | ICD-10-CM

## 2019-05-29 DIAGNOSIS — R69 Illness, unspecified: Secondary | ICD-10-CM | POA: Diagnosis not present

## 2019-06-14 ENCOUNTER — Other Ambulatory Visit (INDEPENDENT_AMBULATORY_CARE_PROVIDER_SITE_OTHER): Payer: Medicare HMO

## 2019-06-14 ENCOUNTER — Other Ambulatory Visit: Payer: Self-pay

## 2019-06-14 DIAGNOSIS — Z23 Encounter for immunization: Secondary | ICD-10-CM | POA: Diagnosis not present

## 2019-06-21 ENCOUNTER — Other Ambulatory Visit: Payer: Self-pay | Admitting: Family Medicine

## 2019-06-21 DIAGNOSIS — Z1231 Encounter for screening mammogram for malignant neoplasm of breast: Secondary | ICD-10-CM

## 2019-06-26 ENCOUNTER — Ambulatory Visit (INDEPENDENT_AMBULATORY_CARE_PROVIDER_SITE_OTHER): Payer: Medicare HMO | Admitting: Licensed Clinical Social Worker

## 2019-06-26 DIAGNOSIS — F3341 Major depressive disorder, recurrent, in partial remission: Secondary | ICD-10-CM

## 2019-06-26 DIAGNOSIS — R69 Illness, unspecified: Secondary | ICD-10-CM | POA: Diagnosis not present

## 2019-07-01 ENCOUNTER — Other Ambulatory Visit: Payer: Self-pay | Admitting: Family Medicine

## 2019-07-01 DIAGNOSIS — F419 Anxiety disorder, unspecified: Secondary | ICD-10-CM

## 2019-07-01 NOTE — Telephone Encounter (Signed)
[facility] Please advise [facility]

## 2019-07-05 ENCOUNTER — Encounter: Payer: Self-pay | Admitting: Internal Medicine

## 2019-07-15 ENCOUNTER — Other Ambulatory Visit: Payer: Self-pay | Admitting: *Deleted

## 2019-07-15 DIAGNOSIS — Z20828 Contact with and (suspected) exposure to other viral communicable diseases: Secondary | ICD-10-CM | POA: Diagnosis not present

## 2019-07-15 DIAGNOSIS — Z20822 Contact with and (suspected) exposure to covid-19: Secondary | ICD-10-CM

## 2019-07-16 LAB — NOVEL CORONAVIRUS, NAA: SARS-CoV-2, NAA: NOT DETECTED

## 2019-07-24 ENCOUNTER — Ambulatory Visit (INDEPENDENT_AMBULATORY_CARE_PROVIDER_SITE_OTHER): Payer: Medicare HMO | Admitting: Licensed Clinical Social Worker

## 2019-07-24 DIAGNOSIS — R69 Illness, unspecified: Secondary | ICD-10-CM | POA: Diagnosis not present

## 2019-07-24 DIAGNOSIS — F3341 Major depressive disorder, recurrent, in partial remission: Secondary | ICD-10-CM | POA: Diagnosis not present

## 2019-08-01 ENCOUNTER — Ambulatory Visit: Payer: Self-pay

## 2019-08-01 ENCOUNTER — Ambulatory Visit
Admission: RE | Admit: 2019-08-01 | Discharge: 2019-08-01 | Disposition: A | Discharge status: Home / Self Care | Payer: Medicare HMO | Source: Ambulatory Visit | Attending: Family Medicine | Admitting: Family Medicine

## 2019-08-01 ENCOUNTER — Other Ambulatory Visit: Payer: Self-pay

## 2019-08-01 DIAGNOSIS — Z1231 Encounter for screening mammogram for malignant neoplasm of breast: Secondary | ICD-10-CM

## 2019-08-02 DIAGNOSIS — Z882 Allergy status to sulfonamides status: Secondary | ICD-10-CM | POA: Diagnosis not present

## 2019-08-02 DIAGNOSIS — E785 Hyperlipidemia, unspecified: Secondary | ICD-10-CM | POA: Diagnosis not present

## 2019-08-02 DIAGNOSIS — Z8249 Family history of ischemic heart disease and other diseases of the circulatory system: Secondary | ICD-10-CM | POA: Diagnosis not present

## 2019-08-02 DIAGNOSIS — R69 Illness, unspecified: Secondary | ICD-10-CM | POA: Diagnosis not present

## 2019-08-07 ENCOUNTER — Ambulatory Visit: Payer: Self-pay

## 2019-08-20 ENCOUNTER — Ambulatory Visit (INDEPENDENT_AMBULATORY_CARE_PROVIDER_SITE_OTHER): Payer: Medicare HMO | Admitting: Licensed Clinical Social Worker

## 2019-08-20 DIAGNOSIS — F3341 Major depressive disorder, recurrent, in partial remission: Secondary | ICD-10-CM

## 2019-08-20 DIAGNOSIS — R69 Illness, unspecified: Secondary | ICD-10-CM | POA: Diagnosis not present

## 2019-09-11 ENCOUNTER — Other Ambulatory Visit: Payer: Self-pay | Admitting: Family Medicine

## 2019-09-11 DIAGNOSIS — F419 Anxiety disorder, unspecified: Secondary | ICD-10-CM

## 2019-09-11 NOTE — Telephone Encounter (Signed)
[facility]. Pt has an appt coming up in march. Please advise. [place]

## 2019-09-18 ENCOUNTER — Ambulatory Visit (INDEPENDENT_AMBULATORY_CARE_PROVIDER_SITE_OTHER): Payer: Medicare HMO | Admitting: Licensed Clinical Social Worker

## 2019-09-18 DIAGNOSIS — F3341 Major depressive disorder, recurrent, in partial remission: Secondary | ICD-10-CM | POA: Diagnosis not present

## 2019-09-18 DIAGNOSIS — R69 Illness, unspecified: Secondary | ICD-10-CM | POA: Diagnosis not present

## 2019-09-30 ENCOUNTER — Other Ambulatory Visit: Payer: Medicare HMO

## 2019-09-30 ENCOUNTER — Ambulatory Visit: Payer: Medicare HMO | Attending: Internal Medicine

## 2019-09-30 DIAGNOSIS — Z20822 Contact with and (suspected) exposure to covid-19: Secondary | ICD-10-CM | POA: Diagnosis not present

## 2019-10-02 LAB — NOVEL CORONAVIRUS, NAA: SARS-CoV-2, NAA: NOT DETECTED

## 2019-10-16 ENCOUNTER — Ambulatory Visit (INDEPENDENT_AMBULATORY_CARE_PROVIDER_SITE_OTHER): Payer: Medicare HMO | Admitting: Licensed Clinical Social Worker

## 2019-10-16 DIAGNOSIS — F3341 Major depressive disorder, recurrent, in partial remission: Secondary | ICD-10-CM

## 2019-10-16 DIAGNOSIS — R69 Illness, unspecified: Secondary | ICD-10-CM | POA: Diagnosis not present

## 2019-10-17 ENCOUNTER — Telehealth: Payer: Self-pay | Admitting: Family Medicine

## 2019-10-17 NOTE — Telephone Encounter (Signed)
Have her check with Almyra Free and get her recommendation

## 2019-10-17 NOTE — Telephone Encounter (Signed)
Ret call to pt, reached vm.  She had wanted to talk about MY Chart and covid vaccine 

## 2019-10-17 NOTE — Telephone Encounter (Signed)
Pt called and states her therapist Marya Amsler is retiring in March and pt needs you to tell her who another therapist would be for her depression and anxiety.  Nobody too old as she wants them to last her.

## 2019-10-18 ENCOUNTER — Ambulatory Visit: Payer: Medicare HMO

## 2019-10-18 NOTE — Telephone Encounter (Signed)
Pt was advised [facility] 

## 2019-10-26 ENCOUNTER — Ambulatory Visit: Payer: Medicare HMO | Attending: Internal Medicine

## 2019-10-26 DIAGNOSIS — Z23 Encounter for immunization: Secondary | ICD-10-CM | POA: Insufficient documentation

## 2019-10-26 NOTE — Progress Notes (Signed)
   Covid-19 Vaccination Clinic  Name:  Marilyn Dixon    MRN: [id number] DOB: [birth year]  10/26/2019  [name] was observed post Covid-19 immunization for 15 minutes without incidence. She was provided with Vaccine Information Sheet and instruction to access the V-Safe system.   [name] was instructed to call 911 with any severe reactions post vaccine: [name] Kitchen Difficulty breathing  . Swelling of your face and throat  . A fast heartbeat  . A bad rash all over your body  . Dizziness and weakness    Immunizations Administered    Name Date Dose VIS Date Route   Pfizer COVID-19 Vaccine 10/26/2019  1:19 PM 0.3 mL 08/30/2019 Intramuscular   Manufacturer: [place]   Lot: [id number]   [place]: [id number]

## 2019-10-28 ENCOUNTER — Other Ambulatory Visit: Payer: Self-pay | Admitting: Family Medicine

## 2019-10-28 DIAGNOSIS — F419 Anxiety disorder, unspecified: Secondary | ICD-10-CM

## 2019-10-28 NOTE — Telephone Encounter (Signed)
[facility] Please advise [facility]

## 2019-11-04 ENCOUNTER — Ambulatory Visit: Payer: Medicare HMO

## 2019-11-13 ENCOUNTER — Ambulatory Visit (INDEPENDENT_AMBULATORY_CARE_PROVIDER_SITE_OTHER): Payer: Medicare HMO | Admitting: Licensed Clinical Social Worker

## 2019-11-13 DIAGNOSIS — R69 Illness, unspecified: Secondary | ICD-10-CM | POA: Diagnosis not present

## 2019-11-13 DIAGNOSIS — F3341 Major depressive disorder, recurrent, in partial remission: Secondary | ICD-10-CM | POA: Diagnosis not present

## 2019-11-20 ENCOUNTER — Ambulatory Visit: Payer: Medicare HMO | Attending: Internal Medicine

## 2019-11-20 DIAGNOSIS — Z23 Encounter for immunization: Secondary | ICD-10-CM

## 2019-11-20 NOTE — Progress Notes (Signed)
   Covid-19 Vaccination Clinic  Name:  [name]    MRN: [id number] DOB: 1950/10/25  11/20/2019  [name] was observed post Covid-19 immunization for 15 minutes without incident. She was provided with Vaccine Information Sheet and instruction to access the V-Safe system.   [name] was instructed to call 911 with any severe reactions post vaccine: [name] Kitchen Difficulty breathing  . Swelling of face and throat  . A fast heartbeat  . A bad rash all over body  . Dizziness and weakness   Immunizations Administered    Name Date Dose VIS Date Route   Pfizer COVID-19 Vaccine 11/20/2019  8:58 AM 0.3 mL 08/30/2019 Intramuscular   Manufacturer: [place]   Lot: [id number]   [place]: [id number]

## 2019-12-11 ENCOUNTER — Ambulatory Visit (INDEPENDENT_AMBULATORY_CARE_PROVIDER_SITE_OTHER): Payer: Medicare HMO | Admitting: Licensed Clinical Social Worker

## 2019-12-11 DIAGNOSIS — F3341 Major depressive disorder, recurrent, in partial remission: Secondary | ICD-10-CM

## 2019-12-11 DIAGNOSIS — R69 Illness, unspecified: Secondary | ICD-10-CM | POA: Diagnosis not present

## 2019-12-12 ENCOUNTER — Ambulatory Visit: Payer: Medicare HMO | Admitting: Family Medicine

## 2019-12-17 ENCOUNTER — Ambulatory Visit (INDEPENDENT_AMBULATORY_CARE_PROVIDER_SITE_OTHER): Payer: Medicare HMO | Admitting: Family Medicine

## 2019-12-17 ENCOUNTER — Other Ambulatory Visit: Payer: Self-pay

## 2019-12-17 ENCOUNTER — Encounter: Payer: Self-pay | Admitting: Family Medicine

## 2019-12-17 VITALS — BP 122/78 | HR 79 | Temp 96.8°F | Ht 63.0 in | Wt 149.2 lb

## 2019-12-17 DIAGNOSIS — Z0000 Encounter for general adult medical examination without abnormal findings: Secondary | ICD-10-CM | POA: Diagnosis not present

## 2019-12-17 DIAGNOSIS — H409 Unspecified glaucoma: Secondary | ICD-10-CM | POA: Diagnosis not present

## 2019-12-17 DIAGNOSIS — R69 Illness, unspecified: Secondary | ICD-10-CM | POA: Diagnosis not present

## 2019-12-17 DIAGNOSIS — K219 Gastro-esophageal reflux disease without esophagitis: Secondary | ICD-10-CM

## 2019-12-17 DIAGNOSIS — E785 Hyperlipidemia, unspecified: Secondary | ICD-10-CM | POA: Diagnosis not present

## 2019-12-17 DIAGNOSIS — J301 Allergic rhinitis due to pollen: Secondary | ICD-10-CM | POA: Diagnosis not present

## 2019-12-17 DIAGNOSIS — F4322 Adjustment disorder with anxiety: Secondary | ICD-10-CM

## 2019-12-17 DIAGNOSIS — Z9849 Cataract extraction status, unspecified eye: Secondary | ICD-10-CM

## 2019-12-17 DIAGNOSIS — F419 Anxiety disorder, unspecified: Secondary | ICD-10-CM

## 2019-12-17 DIAGNOSIS — F338 Other recurrent depressive disorders: Secondary | ICD-10-CM

## 2019-12-17 MED ORDER — ATORVASTATIN CALCIUM 10 MG PO TABS: ORAL_TABLET | ORAL | 3 refills | Status: DC

## 2019-12-17 MED ORDER — SERTRALINE HCL 100 MG PO TABS: ORAL_TABLET | ORAL | 3 refills | Status: DC

## 2019-12-17 MED ORDER — CLONAZEPAM 0.5 MG PO TABS: ORAL_TABLET | ORAL | 0 refills | Status: DC

## 2019-12-17 NOTE — Progress Notes (Signed)
Marilyn Dixon is a 69 y.o. female who presents for annual wellness visit,CPE and follow-up on chronic medical conditions.  She has underlying allergies and is using Zyrtec and getting fairly good results with that.  Her reflux seems to be under good control on Prilosec.  Psychologically she is getting ready to switch to a different therapist.  Marilyn Dixon she is doing fairly well on Zoloft and does use Klonopin on an as-needed basis for anxiety.  She is cites routine issues causing her anxiety including Covid.  She has had cataract surgery and is happy with that.  She is followed also for her glaucoma.  She continues on Lipitor for hyperlipidemia.  She otherwise has no particular concerns or complaints.  Family and social history was also reviewed.  Her marriage is going quite well.  Immunizations and Health Maintenance Immunization History  Administered Date(s) Administered  . DT (Pediatric) 06/27/1997  . Fluad Quad(high Dose 65+) 06/14/2019  . Hepatitis A 06/19/2007, 07/22/2010  . Influenza Split 09/23/1999, 07/20/2012, 06/20/2013  . Influenza Whole 09/24/2002, 07/17/2009, 05/09/2010  . Influenza, High Dose Seasonal PF 06/22/2015, 07/04/2016, 06/07/2017, 08/03/2018  . Influenza,inj,Quad PF,6+ Mos 05/20/2014  . Influenza-Unspecified 08/03/2018  . PFIZER SARS-COV-2 Vaccination 10/26/2019, 11/20/2019  . Pneumococcal Conjugate-13 07/29/2013  . Pneumococcal Polysaccharide-23 05/11/2005  . Tdap 06/19/2007, 01/14/2011  . Zoster 07/22/2010  . Zoster Recombinat (Shingrix) 07/20/2017, 12/19/2017   There are no preventive care reminders to display for this patient.  Last Pap smear: aged out  Last mammogram: 08/02/19 Last colonoscopy: 05/27/15 Last DEXA: 07/14/16 Dentist: Q six months  Ophtho: yearly Exercise: walking and gym member   Other doctors caring for patient include: Dr. Michail Sermon GI, Dr. Kathlen Mody eye  Advanced directives: Not on file  Copy asked for Does Patient Have a Medical Advance  Directive?: Yes Type of Advance Directive: [place] will Does patient want to make changes to medical advance directive?: No - Patient declined Copy of [place] in Chart?: No - copy requested  Depression screen:  See questionnaire below.  Depression screen [facility] 2/9 12/17/2019 12/11/2018 12/07/2017 08/25/2016 08/25/2016  Decreased Interest 1 0 0 1 0  Down, Depressed, Hopeless 0 0 0 1 0  PHQ - 2 Score 1 0 0 2 0    Fall Risk Screen: see questionnaire below. Fall Risk  12/17/2019 12/11/2018 12/07/2017 08/25/2016 08/18/2014  Falls in the past year? 0 0 No No No    ADL screen:  See questionnaire below Functional Status Survey: Is the patient deaf or have difficulty hearing?: No Does the patient have difficulty seeing, even when wearing glasses/contacts?: No Does the patient have difficulty concentrating, remembering, or making decisions?: No Does the patient have difficulty walking or climbing stairs?: No Does the patient have difficulty dressing or bathing?: No Does the patient have difficulty doing errands alone such as visiting a doctor's office or shopping?: No   Review of Systems Constitutional: -, -unexpected weight change, -anorexia, -fatigue  Dermatology: denies changing moles, rash, lumps ENT: , -ear pain, -sore throat,  Cardiology:  -chest pain, -palpitations, -orthopnea, Respiratory: -cough, -shortness of breath, -dyspnea on exertion, -wheezing,  Gastroenterology: -abdominal pain, -nausea, -vomiting, -diarrhea, -constipation, -dysphagia Hematology: -bleeding or bruising problems Musculoskeletal: -arthralgias, -myalgias, -joint swelling, -back pain, - Ophthalmology: -vision changes,  Urology: -dysuria, -difficulty urinating,  -urinary frequency, -urgency, incontinence Neurology: -, -numbness, , -memory loss, -falls, -dizziness    PHYSICAL EXAM:  BP 122/78 (BP Location: Left Arm, Patient Position: Sitting)   Pulse 79  Temp (!)  96.8 F (36 C)   Ht 5\' 3"  (1.6 m)   Wt 149 lb 3.2 oz (67.7 kg)   SpO2 93%   BMI 26.43 kg/m   General Appearance: Alert, cooperative, no distress, appears stated age Head: Normocephalic, without obvious abnormality, atraumatic Eyes: PERRL, conjunctiva/corneas clear, EOM's intact,  Ears: Normal TM's and external ear canals Nose: Nares normal, mucosa normal, no drainage or sinus tenderness Throat: Lips, mucosa, and tongue normal; teeth and gums normal Neck: Supple, no lymphadenopathy;  thyroid:  no enlargement/tenderness/nodules; no carotid bruit or JVD Lungs: Clear to auscultation bilaterally without wheezes, rales or ronchi; respirations unlabored Heart: Regular rate and rhythm, S1 and S2 normal, no murmur, rubor gallop Abdomen: Soft, non-tender, nondistended, normoactive bowel sounds,  no masses, no hepatosplenomegaly  Skin:  Skin color, texture, turgor normal, no rashes or lesions Lymph nodes: Cervical, supraclavicular, and axillary nodes normal Neurologic:  CNII-XII intact, normal strength, sensation and gait; reflexes 2+ and symmetric throughout Psych: Normal mood, affect, hygiene and grooming.  ASSESSMENT/PLAN: Routine general medical examination at a health care facility - Plan: Lipid Panel, CBC with Differential, Comprehensive metabolic panel  Status post cataract extraction, unspecified laterality  Glaucoma, unspecified glaucoma type, unspecified laterality  Non-seasonal allergic rhinitis due to pollen  Gastroesophageal reflux disease without esophagitis - Plan: CBC with Differential, Comprehensive metabolic panel  Hyperlipidemia LDL goal <100 - Plan: Lipid Panel, atorvastatin (LIPITOR) 10 MG tablet  Seasonal affective disorder ([facility]) - Plan: sertraline (ZOLOFT) 100 MG tablet  Reaction, adjustment, with anxious mood  Anxiety - Plan: sertraline (ZOLOFT) 100 MG tablet, clonazePAM (KLONOPIN) 0.5 MG tablet Recommend she add Rhinocort to her regimen to treat the allergies.   She will continue on her present psychotropic medications however I did discuss using Klonopin less often and to evaluate what makes her anxious and see if she can work on this from a different perspective other than relying on medication.  She was amenable to this.      Immunization recommendations discussed.  Colonoscopy recommendations reviewed   Medicare Attestation I have personally reviewed: The patient's medical and social history Their use of alcohol, tobacco or illicit drugs Their current medications and supplements The patient's functional ability including ADLs,fall risks, home safety risks, cognitive, and hearing and visual impairment Diet and physical activities Evidence for depression or mood disorders  The patient's weight, height, and BMI have been recorded in the chart.  I have made referrals, counseling, and provided education to the patient based on review of the above and I have provided the patient with a written personalized care plan for preventive services.     Jill Alexanders, MD   12/17/2019

## 2019-12-17 NOTE — Patient Instructions (Signed)
  Marilyn Dixon , Thank you for taking time to come for your Medicare Wellness Visit. I appreciate your ongoing commitment to your health goals. Please review the following plan we discussed and let me know if I can assist you in the future.   These are the goals we discussed: Goals   None     This is a list of the screening recommended for you and due dates:  Health Maintenance  Topic Date Due  . Tetanus Vaccine  01/13/2021  . Mammogram  07/31/2021  . Colon Cancer Screening  05/26/2025  . Flu Shot  Completed  . DEXA scan (bone density measurement)  Completed  .  Hepatitis C: One time screening is recommended by Center for Disease Control  (CDC) for  adults born from [birth year] through 1965.   Completed  . Pneumonia vaccines  Discontinued

## 2019-12-18 LAB — CBC WITH DIFFERENTIAL/PLATELET
Basophils Absolute: 0 10*3/uL (ref 0.0–0.2)
Basos: 1 %
EOS (ABSOLUTE): 0.2 10*3/uL (ref 0.0–0.4)
Eos: 2 %
Hematocrit: 46.8 % — ABNORMAL HIGH (ref 34.0–46.6)
Hemoglobin: 16.2 g/dL — ABNORMAL HIGH (ref 11.1–15.9)
Immature Grans (Abs): 0 10*3/uL (ref 0.0–0.1)
Immature Granulocytes: 0 %
Lymphocytes Absolute: 1.6 10*3/uL (ref 0.7–3.1)
Lymphs: 25 %
MCH: 31.7 pg (ref 26.6–33.0)
MCHC: 34.6 g/dL (ref 31.5–35.7)
MCV: 92 fL (ref 79–97)
Monocytes Absolute: 0.6 10*3/uL (ref 0.1–0.9)
Monocytes: 9 %
Neutrophils Absolute: 4 10*3/uL (ref 1.4–7.0)
Neutrophils: 63 %
Platelets: 233 10*3/uL (ref 150–450)
RBC: 5.11 x10E6/uL (ref 3.77–5.28)
RDW: 12.1 % (ref 11.7–15.4)
WBC: 6.4 10*3/uL (ref 3.4–10.8)

## 2019-12-18 LAB — COMPREHENSIVE METABOLIC PANEL
ALT: 21 IU/L (ref 0–32)
AST: 26 IU/L (ref 0–40)
Albumin/Globulin Ratio: 2 (ref 1.2–2.2)
Albumin: 4.7 g/dL (ref 3.8–4.8)
Alkaline Phosphatase: 140 IU/L — ABNORMAL HIGH (ref 39–117)
BUN/Creatinine Ratio: 16 (ref 12–28)
BUN: 16 mg/dL (ref 8–27)
Bilirubin Total: 0.5 mg/dL (ref 0.0–1.2)
CO2: 24 mmol/L (ref 20–29)
Calcium: 10.8 mg/dL — ABNORMAL HIGH (ref 8.7–10.3)
Chloride: 103 mmol/L (ref 96–106)
Creatinine, Ser: 0.98 mg/dL (ref 0.57–1.00)
GFR calc Af Amer: 68 mL/min/{1.73_m2} (ref >59)
GFR calc non Af Amer: 59 mL/min/{1.73_m2} — ABNORMAL LOW (ref >59)
Globulin, Total: 2.3 g/dL (ref 1.5–4.5)
Glucose: 95 mg/dL (ref 65–99)
Potassium: 4.6 mmol/L (ref 3.5–5.2)
Sodium: 143 mmol/L (ref 134–144)
Total Protein: 7 g/dL (ref 6.0–8.5)

## 2019-12-18 LAB — LIPID PANEL
Chol/HDL Ratio: 3.3 ratio (ref 0.0–4.4)
Cholesterol, Total: 213 mg/dL — ABNORMAL HIGH (ref 100–199)
HDL: 65 mg/dL (ref >39)
LDL Chol Calc (NIH): 126 mg/dL — ABNORMAL HIGH (ref 0–99)
Triglycerides: 126 mg/dL (ref 0–149)
VLDL Cholesterol Cal: 22 mg/dL (ref 5–40)

## 2020-01-01 ENCOUNTER — Ambulatory Visit (INDEPENDENT_AMBULATORY_CARE_PROVIDER_SITE_OTHER): Payer: Medicare HMO | Admitting: Psychology

## 2020-01-01 DIAGNOSIS — F3289 Other specified depressive episodes: Secondary | ICD-10-CM | POA: Diagnosis not present

## 2020-01-01 DIAGNOSIS — F411 Generalized anxiety disorder: Secondary | ICD-10-CM

## 2020-01-01 DIAGNOSIS — R69 Illness, unspecified: Secondary | ICD-10-CM | POA: Diagnosis not present

## 2020-01-06 DIAGNOSIS — H35363 Drusen (degenerative) of macula, bilateral: Secondary | ICD-10-CM | POA: Diagnosis not present

## 2020-01-06 DIAGNOSIS — H40023 Open angle with borderline findings, high risk, bilateral: Secondary | ICD-10-CM | POA: Diagnosis not present

## 2020-01-06 DIAGNOSIS — H26493 Other secondary cataract, bilateral: Secondary | ICD-10-CM | POA: Diagnosis not present

## 2020-01-06 DIAGNOSIS — Z961 Presence of intraocular lens: Secondary | ICD-10-CM | POA: Diagnosis not present

## 2020-01-22 ENCOUNTER — Ambulatory Visit (INDEPENDENT_AMBULATORY_CARE_PROVIDER_SITE_OTHER): Payer: Medicare HMO | Admitting: Psychology

## 2020-01-22 DIAGNOSIS — R69 Illness, unspecified: Secondary | ICD-10-CM | POA: Diagnosis not present

## 2020-01-22 DIAGNOSIS — F3289 Other specified depressive episodes: Secondary | ICD-10-CM | POA: Diagnosis not present

## 2020-01-22 DIAGNOSIS — F411 Generalized anxiety disorder: Secondary | ICD-10-CM

## 2020-02-24 ENCOUNTER — Ambulatory Visit (INDEPENDENT_AMBULATORY_CARE_PROVIDER_SITE_OTHER): Payer: Medicare HMO | Admitting: Psychology

## 2020-02-24 DIAGNOSIS — F411 Generalized anxiety disorder: Secondary | ICD-10-CM

## 2020-02-24 DIAGNOSIS — R69 Illness, unspecified: Secondary | ICD-10-CM | POA: Diagnosis not present

## 2020-02-24 DIAGNOSIS — F3289 Other specified depressive episodes: Secondary | ICD-10-CM

## 2020-03-20 ENCOUNTER — Other Ambulatory Visit: Payer: Self-pay | Admitting: Family Medicine

## 2020-03-20 DIAGNOSIS — F419 Anxiety disorder, unspecified: Secondary | ICD-10-CM

## 2020-03-24 ENCOUNTER — Ambulatory Visit (INDEPENDENT_AMBULATORY_CARE_PROVIDER_SITE_OTHER): Payer: Medicare HMO | Admitting: Psychology

## 2020-03-24 DIAGNOSIS — F411 Generalized anxiety disorder: Secondary | ICD-10-CM

## 2020-03-24 DIAGNOSIS — F3289 Other specified depressive episodes: Secondary | ICD-10-CM

## 2020-03-24 DIAGNOSIS — R69 Illness, unspecified: Secondary | ICD-10-CM | POA: Diagnosis not present

## 2020-03-24 NOTE — Telephone Encounter (Signed)
[facility] to [facility]. Please advise [facility]

## 2020-05-12 ENCOUNTER — Ambulatory Visit (INDEPENDENT_AMBULATORY_CARE_PROVIDER_SITE_OTHER): Payer: Medicare HMO | Admitting: Psychology

## 2020-05-12 DIAGNOSIS — F3289 Other specified depressive episodes: Secondary | ICD-10-CM | POA: Diagnosis not present

## 2020-05-12 DIAGNOSIS — R69 Illness, unspecified: Secondary | ICD-10-CM | POA: Diagnosis not present

## 2020-05-12 DIAGNOSIS — F411 Generalized anxiety disorder: Secondary | ICD-10-CM

## 2020-05-12 DIAGNOSIS — Z20822 Contact with and (suspected) exposure to covid-19: Secondary | ICD-10-CM | POA: Diagnosis not present

## 2020-05-22 DIAGNOSIS — R69 Illness, unspecified: Secondary | ICD-10-CM | POA: Diagnosis not present

## 2020-05-22 DIAGNOSIS — Z818 Family history of other mental and behavioral disorders: Secondary | ICD-10-CM | POA: Diagnosis not present

## 2020-05-22 DIAGNOSIS — F410 Panic disorder [episodic paroxysmal anxiety] without agoraphobia: Secondary | ICD-10-CM | POA: Diagnosis not present

## 2020-05-22 DIAGNOSIS — Z7722 Contact with and (suspected) exposure to environmental tobacco smoke (acute) (chronic): Secondary | ICD-10-CM | POA: Diagnosis not present

## 2020-05-22 DIAGNOSIS — Z823 Family history of stroke: Secondary | ICD-10-CM | POA: Diagnosis not present

## 2020-05-22 DIAGNOSIS — F411 Generalized anxiety disorder: Secondary | ICD-10-CM | POA: Diagnosis not present

## 2020-05-22 DIAGNOSIS — E785 Hyperlipidemia, unspecified: Secondary | ICD-10-CM | POA: Diagnosis not present

## 2020-05-22 DIAGNOSIS — R030 Elevated blood-pressure reading, without diagnosis of hypertension: Secondary | ICD-10-CM | POA: Diagnosis not present

## 2020-05-22 DIAGNOSIS — Z8249 Family history of ischemic heart disease and other diseases of the circulatory system: Secondary | ICD-10-CM | POA: Diagnosis not present

## 2020-05-22 DIAGNOSIS — R32 Unspecified urinary incontinence: Secondary | ICD-10-CM | POA: Diagnosis not present

## 2020-07-14 ENCOUNTER — Other Ambulatory Visit: Payer: Self-pay | Admitting: Family Medicine

## 2020-07-14 DIAGNOSIS — Z1231 Encounter for screening mammogram for malignant neoplasm of breast: Secondary | ICD-10-CM

## 2020-07-17 ENCOUNTER — Other Ambulatory Visit: Payer: Self-pay

## 2020-07-17 ENCOUNTER — Other Ambulatory Visit (INDEPENDENT_AMBULATORY_CARE_PROVIDER_SITE_OTHER): Payer: Medicare HMO

## 2020-07-17 DIAGNOSIS — Z23 Encounter for immunization: Secondary | ICD-10-CM

## 2020-08-19 ENCOUNTER — Ambulatory Visit
Admission: RE | Admit: 2020-08-19 | Discharge: 2020-08-19 | Disposition: A | Discharge status: Home / Self Care | Payer: Medicare HMO | Source: Ambulatory Visit | Attending: Family Medicine | Admitting: Family Medicine

## 2020-08-19 ENCOUNTER — Other Ambulatory Visit: Payer: Self-pay

## 2020-08-19 DIAGNOSIS — Z1231 Encounter for screening mammogram for malignant neoplasm of breast: Secondary | ICD-10-CM

## 2020-09-05 ENCOUNTER — Other Ambulatory Visit: Payer: Medicare HMO

## 2020-09-05 DIAGNOSIS — Z20822 Contact with and (suspected) exposure to covid-19: Secondary | ICD-10-CM | POA: Diagnosis not present

## 2020-09-08 LAB — NOVEL CORONAVIRUS, NAA: SARS-CoV-2, NAA: NOT DETECTED

## 2020-10-12 ENCOUNTER — Telehealth (INDEPENDENT_AMBULATORY_CARE_PROVIDER_SITE_OTHER): Payer: Medicare HMO | Admitting: Family Medicine

## 2020-10-12 ENCOUNTER — Other Ambulatory Visit: Payer: Self-pay

## 2020-10-12 ENCOUNTER — Encounter: Payer: Self-pay | Admitting: Family Medicine

## 2020-10-12 VITALS — Temp 99.6°F | Wt 149.0 lb

## 2020-10-12 DIAGNOSIS — J029 Acute pharyngitis, unspecified: Secondary | ICD-10-CM

## 2020-10-12 DIAGNOSIS — R5383 Other fatigue: Secondary | ICD-10-CM | POA: Diagnosis not present

## 2020-10-12 DIAGNOSIS — R519 Headache, unspecified: Secondary | ICD-10-CM | POA: Diagnosis not present

## 2020-10-12 NOTE — Progress Notes (Addendum)
   Subjective:    Patient ID: Marilyn Dixon, female    DOB: 1951-06-08, 70 y.o.   MRN: [id number]  HPI I connected with  Marilyn Dixon on 10/12/20 by a video enabled telemedicine application and verified that I am speaking with the correct person using two identifiers.  Caregility used Me;office patient at home I discussed the limitations of evaluation and management by telemedicine. The patient expressed understanding and agreed to proceed. Last Saturday she developed the onset of headache, myalgias, malaise, fatigue, sore throat with congestion.  No smell or taste change.  She has had her Covid vaccines.  She has been using 2 Aleve as needed as well as Mucinex.  Review of Systems     Objective:   Physical Exam Alert and in no distress but toxic looking.       Assessment & Plan:  Fatigue, unspecified type - Plan: POC COVID-19, Novel Coronavirus, NAA ([facility])  Nonintractable headache, unspecified chronicity pattern, unspecified headache type - Plan: POC COVID-19, Novel Coronavirus, NAA ([facility]) Recommended 2 Tylenol 4 times per day, 2 Aleve twice per day and continue with Mucinex.  Discussed worsening of symptoms in regard to fever shortness of breath and coughing.  20 minutes spent reviewing her medical records today counseling and coordination of care.  1/25  8;30 she did a rapid Covid test at home which was positive.  She is concerned about her sore throat.  I explained that this is probably Covid related and was treated symptomatically for right now.

## 2020-10-13 ENCOUNTER — Telehealth: Payer: Medicare HMO | Admitting: Family Medicine

## 2020-10-13 ENCOUNTER — Other Ambulatory Visit: Payer: Self-pay

## 2020-10-13 ENCOUNTER — Telehealth: Payer: Self-pay | Admitting: Family Medicine

## 2020-10-13 NOTE — Addendum Note (Signed)
Addended by: Denita Lung on: 10/13/2020 08:29 AM   Modules accepted: Orders

## 2020-10-13 NOTE — Telephone Encounter (Signed)
Pt called and states that she tested at home for COVID last night and test was positive. She states that she has a very sore throat and wonders if she may have strep. Please advise pt at [phone].

## 2020-10-13 NOTE — Addendum Note (Signed)
Addended by: Denita Lung on: 10/13/2020 08:35 AM   Modules accepted: Orders

## 2020-10-16 ENCOUNTER — Telehealth: Payer: Self-pay

## 2020-10-16 NOTE — Telephone Encounter (Signed)
Let her know that there is nothing in particular that can be done other than taking good care of yourself, good nutrition and multivitamins

## 2020-10-16 NOTE — Telephone Encounter (Signed)
Pt. Called stating she wanted to give you an update on her condition. Her throat and congestion is better, she is still very tired and fatigued. She wanted to know if you have any suggestion as far as building her self back up after covid.

## 2020-10-16 NOTE — Telephone Encounter (Signed)
Pt was advised [facility] 

## 2020-11-30 ENCOUNTER — Other Ambulatory Visit: Payer: Self-pay | Admitting: Family Medicine

## 2020-11-30 DIAGNOSIS — F419 Anxiety disorder, unspecified: Secondary | ICD-10-CM

## 2020-11-30 DIAGNOSIS — F338 Other recurrent depressive disorders: Secondary | ICD-10-CM

## 2020-11-30 NOTE — Telephone Encounter (Signed)
Is this okay to refill? 

## 2020-12-17 ENCOUNTER — Ambulatory Visit: Payer: Medicare HMO | Admitting: Family Medicine

## 2021-01-07 DIAGNOSIS — Z961 Presence of intraocular lens: Secondary | ICD-10-CM | POA: Diagnosis not present

## 2021-01-07 DIAGNOSIS — H524 Presbyopia: Secondary | ICD-10-CM | POA: Diagnosis not present

## 2021-01-07 DIAGNOSIS — H35363 Drusen (degenerative) of macula, bilateral: Secondary | ICD-10-CM | POA: Diagnosis not present

## 2021-01-07 DIAGNOSIS — H26493 Other secondary cataract, bilateral: Secondary | ICD-10-CM | POA: Diagnosis not present

## 2021-01-07 DIAGNOSIS — H40023 Open angle with borderline findings, high risk, bilateral: Secondary | ICD-10-CM | POA: Diagnosis not present

## 2021-01-08 DIAGNOSIS — K640 First degree hemorrhoids: Secondary | ICD-10-CM | POA: Diagnosis not present

## 2021-01-08 DIAGNOSIS — Z86010 Personal history of colonic polyps: Secondary | ICD-10-CM | POA: Diagnosis not present

## 2021-01-08 DIAGNOSIS — D124 Benign neoplasm of descending colon: Secondary | ICD-10-CM | POA: Diagnosis not present

## 2021-01-08 LAB — HM COLONOSCOPY

## 2021-01-12 DIAGNOSIS — D124 Benign neoplasm of descending colon: Secondary | ICD-10-CM | POA: Diagnosis not present

## 2021-01-13 ENCOUNTER — Encounter: Payer: Self-pay | Admitting: Family Medicine

## 2021-01-19 ENCOUNTER — Telehealth: Payer: Self-pay | Admitting: Family Medicine

## 2021-01-19 DIAGNOSIS — D369 Benign neoplasm, unspecified site: Secondary | ICD-10-CM | POA: Insufficient documentation

## 2021-01-19 DIAGNOSIS — F419 Anxiety disorder, unspecified: Secondary | ICD-10-CM

## 2021-01-19 MED ORDER — CLONAZEPAM 0.5 MG PO TABS: 0.5000 mg | ORAL_TABLET | Freq: Two times a day (BID) | ORAL | 0 refills | Status: DC | PRN

## 2021-01-19 NOTE — Telephone Encounter (Signed)
Pt came into the office her friend of 42 years who was like a sister recently passed,  Also Marilyn Dixon is also going on a trip to [place] this Monday May 9 - 21.  Pt had cpe 3/31 and we had to moved it for our reasons and she was rescheduled 05/10/21.  Pt is in need of Clonazepam 0.5 mg table to take on this trip and to help her with her grieving.  She takes 1 tab by mouth twice daily as needed for anxiety  [facility] [address]

## 2021-01-19 NOTE — Telephone Encounter (Signed)
I called it in 

## 2021-01-20 ENCOUNTER — Other Ambulatory Visit: Payer: Self-pay | Admitting: Medical

## 2021-01-20 ENCOUNTER — Other Ambulatory Visit: Payer: Self-pay | Admitting: Family Medicine

## 2021-01-20 ENCOUNTER — Other Ambulatory Visit: Payer: Self-pay

## 2021-01-20 ENCOUNTER — Encounter: Payer: Self-pay | Admitting: Family Medicine

## 2021-01-20 DIAGNOSIS — F419 Anxiety disorder, unspecified: Secondary | ICD-10-CM

## 2021-01-20 MED ORDER — CLONAZEPAM 0.5 MG PO TABS: 0.5000 mg | ORAL_TABLET | Freq: Two times a day (BID) | ORAL | 0 refills | Status: DC | PRN

## 2021-01-20 NOTE — Telephone Encounter (Signed)
Called pt, reached voice mail, advised of refill.

## 2021-01-23 DIAGNOSIS — Z20822 Contact with and (suspected) exposure to covid-19: Secondary | ICD-10-CM | POA: Diagnosis not present

## 2021-02-16 DIAGNOSIS — Z823 Family history of stroke: Secondary | ICD-10-CM | POA: Diagnosis not present

## 2021-02-16 DIAGNOSIS — R69 Illness, unspecified: Secondary | ICD-10-CM | POA: Diagnosis not present

## 2021-02-16 DIAGNOSIS — J301 Allergic rhinitis due to pollen: Secondary | ICD-10-CM | POA: Diagnosis not present

## 2021-02-16 DIAGNOSIS — F411 Generalized anxiety disorder: Secondary | ICD-10-CM | POA: Diagnosis not present

## 2021-02-16 DIAGNOSIS — Z882 Allergy status to sulfonamides status: Secondary | ICD-10-CM | POA: Diagnosis not present

## 2021-02-16 DIAGNOSIS — Z9181 History of falling: Secondary | ICD-10-CM | POA: Diagnosis not present

## 2021-02-16 DIAGNOSIS — E785 Hyperlipidemia, unspecified: Secondary | ICD-10-CM | POA: Diagnosis not present

## 2021-02-16 DIAGNOSIS — Z8249 Family history of ischemic heart disease and other diseases of the circulatory system: Secondary | ICD-10-CM | POA: Diagnosis not present

## 2021-02-17 ENCOUNTER — Encounter: Payer: Self-pay | Admitting: Family Medicine

## 2021-02-17 ENCOUNTER — Other Ambulatory Visit: Payer: Self-pay

## 2021-02-17 ENCOUNTER — Ambulatory Visit (INDEPENDENT_AMBULATORY_CARE_PROVIDER_SITE_OTHER): Payer: Medicare HMO | Admitting: Family Medicine

## 2021-02-17 VITALS — BP 112/70 | HR 89 | Temp 98.8°F | Wt 154.6 lb

## 2021-02-17 DIAGNOSIS — S7011XA Contusion of right thigh, initial encounter: Secondary | ICD-10-CM | POA: Diagnosis not present

## 2021-02-17 DIAGNOSIS — T07XXXA Unspecified multiple injuries, initial encounter: Secondary | ICD-10-CM

## 2021-02-17 NOTE — Patient Instructions (Signed)
Heat for 20 minutes 3 times per day and gentle stretching.  2 regular strength Tylenol 4 times per day as needed for aches and pains.  If you need something stronger either Advil or Aleve can be added to it

## 2021-02-17 NOTE — Progress Notes (Signed)
   Subjective:    Patient ID: Marilyn Dixon, female    DOB: [birth year], 70 y.o.   MRN: [id number]  HPI She is here for evaluation of 2 injuries.  Last Tuesday she fell injuring her right hip area as well as left low back.  On Saturday she again fell at this time abrading both knees.  She is here to make sure nothing untoward is happening.   Review of Systems     Objective:   Physical Exam Alert and in no distress.  Exam of the back shows some minimal paravertebral muscle spasm.  No contusion noted.  She does have a contusion present on the right lateral hip area.  Also abrasions are noted on both knees.  No effusion is noted in the knees.       Assessment & Plan:  Abrasions of multiple sites  Contusion of right thigh, initial encounter I explained that at this time I see no evidence of any major damage.  Recommend heat for 20 minutes 3 times per day for her back and thigh area.  Tylenol for pain relief.  She was comfortable with that.

## 2021-02-25 DIAGNOSIS — M5451 Vertebrogenic low back pain: Secondary | ICD-10-CM | POA: Diagnosis not present

## 2021-02-25 DIAGNOSIS — M9903 Segmental and somatic dysfunction of lumbar region: Secondary | ICD-10-CM | POA: Diagnosis not present

## 2021-02-25 DIAGNOSIS — M9904 Segmental and somatic dysfunction of sacral region: Secondary | ICD-10-CM | POA: Diagnosis not present

## 2021-02-25 DIAGNOSIS — M9902 Segmental and somatic dysfunction of thoracic region: Secondary | ICD-10-CM | POA: Diagnosis not present

## 2021-02-25 DIAGNOSIS — M5441 Lumbago with sciatica, right side: Secondary | ICD-10-CM | POA: Diagnosis not present

## 2021-02-26 ENCOUNTER — Ambulatory Visit (INDEPENDENT_AMBULATORY_CARE_PROVIDER_SITE_OTHER): Payer: Medicare HMO | Admitting: Family Medicine

## 2021-02-26 ENCOUNTER — Encounter: Payer: Self-pay | Admitting: Family Medicine

## 2021-02-26 VITALS — BP 136/86 | HR 99 | Temp 98.6°F | Wt 154.0 lb

## 2021-02-26 DIAGNOSIS — M5451 Vertebrogenic low back pain: Secondary | ICD-10-CM | POA: Diagnosis not present

## 2021-02-26 DIAGNOSIS — M5441 Lumbago with sciatica, right side: Secondary | ICD-10-CM | POA: Diagnosis not present

## 2021-02-26 DIAGNOSIS — S7011XA Contusion of right thigh, initial encounter: Secondary | ICD-10-CM | POA: Diagnosis not present

## 2021-02-26 DIAGNOSIS — M9903 Segmental and somatic dysfunction of lumbar region: Secondary | ICD-10-CM | POA: Diagnosis not present

## 2021-02-26 DIAGNOSIS — T07XXXA Unspecified multiple injuries, initial encounter: Secondary | ICD-10-CM | POA: Diagnosis not present

## 2021-02-26 DIAGNOSIS — M9904 Segmental and somatic dysfunction of sacral region: Secondary | ICD-10-CM | POA: Diagnosis not present

## 2021-02-26 DIAGNOSIS — M9902 Segmental and somatic dysfunction of thoracic region: Secondary | ICD-10-CM | POA: Diagnosis not present

## 2021-02-26 NOTE — Progress Notes (Signed)
   Subjective:    Patient ID: Marilyn Dixon, female    DOB: 31-Jan-1951, 70 y.o.   MRN: [id number]  HPI She is here for recheck.  Since last being seen she has seen a chiropractor on Monday Tuesday and Wednesday but this has not been very successful.  She will need to switch to a different chiropractor due to insurance issues.  She continues on Tylenol and Aleve as well as using heat.  She states that she is really not necessarily feeling any better and since her stent has worsened.   Review of Systems     Objective:   Physical Exam Alert and in no distress otherwise not examined       Assessment & Plan:  Abrasions of multiple sites  Contusion of right thigh, initial encounter I explained that I have no problem with her seeing a chiropractor but if this does not work then, we can refer to physical therapy if needed.  Recommend she continue to use Tylenol, Aleve and heat.  I would avoid using ice as I think that would be counterproductive.

## 2021-03-01 DIAGNOSIS — M9904 Segmental and somatic dysfunction of sacral region: Secondary | ICD-10-CM | POA: Diagnosis not present

## 2021-03-01 DIAGNOSIS — M9903 Segmental and somatic dysfunction of lumbar region: Secondary | ICD-10-CM | POA: Diagnosis not present

## 2021-03-01 DIAGNOSIS — M9902 Segmental and somatic dysfunction of thoracic region: Secondary | ICD-10-CM | POA: Diagnosis not present

## 2021-03-03 DIAGNOSIS — M9904 Segmental and somatic dysfunction of sacral region: Secondary | ICD-10-CM | POA: Diagnosis not present

## 2021-03-03 DIAGNOSIS — M9902 Segmental and somatic dysfunction of thoracic region: Secondary | ICD-10-CM | POA: Diagnosis not present

## 2021-03-03 DIAGNOSIS — M9903 Segmental and somatic dysfunction of lumbar region: Secondary | ICD-10-CM | POA: Diagnosis not present

## 2021-03-05 DIAGNOSIS — M9902 Segmental and somatic dysfunction of thoracic region: Secondary | ICD-10-CM | POA: Diagnosis not present

## 2021-03-05 DIAGNOSIS — M9904 Segmental and somatic dysfunction of sacral region: Secondary | ICD-10-CM | POA: Diagnosis not present

## 2021-03-05 DIAGNOSIS — M9903 Segmental and somatic dysfunction of lumbar region: Secondary | ICD-10-CM | POA: Diagnosis not present

## 2021-03-08 DIAGNOSIS — M9903 Segmental and somatic dysfunction of lumbar region: Secondary | ICD-10-CM | POA: Diagnosis not present

## 2021-03-08 DIAGNOSIS — M9902 Segmental and somatic dysfunction of thoracic region: Secondary | ICD-10-CM | POA: Diagnosis not present

## 2021-03-08 DIAGNOSIS — M9904 Segmental and somatic dysfunction of sacral region: Secondary | ICD-10-CM | POA: Diagnosis not present

## 2021-03-10 DIAGNOSIS — M9904 Segmental and somatic dysfunction of sacral region: Secondary | ICD-10-CM | POA: Diagnosis not present

## 2021-03-10 DIAGNOSIS — M9903 Segmental and somatic dysfunction of lumbar region: Secondary | ICD-10-CM | POA: Diagnosis not present

## 2021-03-10 DIAGNOSIS — M9902 Segmental and somatic dysfunction of thoracic region: Secondary | ICD-10-CM | POA: Diagnosis not present

## 2021-03-11 ENCOUNTER — Other Ambulatory Visit: Payer: Self-pay | Admitting: Family Medicine

## 2021-03-11 DIAGNOSIS — E785 Hyperlipidemia, unspecified: Secondary | ICD-10-CM

## 2021-03-12 DIAGNOSIS — M9902 Segmental and somatic dysfunction of thoracic region: Secondary | ICD-10-CM | POA: Diagnosis not present

## 2021-03-12 DIAGNOSIS — M9903 Segmental and somatic dysfunction of lumbar region: Secondary | ICD-10-CM | POA: Diagnosis not present

## 2021-03-12 DIAGNOSIS — M9904 Segmental and somatic dysfunction of sacral region: Secondary | ICD-10-CM | POA: Diagnosis not present

## 2021-03-19 DIAGNOSIS — M9904 Segmental and somatic dysfunction of sacral region: Secondary | ICD-10-CM | POA: Diagnosis not present

## 2021-03-19 DIAGNOSIS — M9902 Segmental and somatic dysfunction of thoracic region: Secondary | ICD-10-CM | POA: Diagnosis not present

## 2021-03-19 DIAGNOSIS — M9903 Segmental and somatic dysfunction of lumbar region: Secondary | ICD-10-CM | POA: Diagnosis not present

## 2021-03-23 ENCOUNTER — Ambulatory Visit (INDEPENDENT_AMBULATORY_CARE_PROVIDER_SITE_OTHER): Payer: Medicare HMO

## 2021-03-23 DIAGNOSIS — Z23 Encounter for immunization: Secondary | ICD-10-CM

## 2021-03-25 ENCOUNTER — Ambulatory Visit: Payer: Medicare HMO

## 2021-03-26 DIAGNOSIS — M9904 Segmental and somatic dysfunction of sacral region: Secondary | ICD-10-CM | POA: Diagnosis not present

## 2021-03-26 DIAGNOSIS — M9903 Segmental and somatic dysfunction of lumbar region: Secondary | ICD-10-CM | POA: Diagnosis not present

## 2021-03-26 DIAGNOSIS — M9902 Segmental and somatic dysfunction of thoracic region: Secondary | ICD-10-CM | POA: Diagnosis not present

## 2021-03-29 ENCOUNTER — Telehealth: Payer: Self-pay | Admitting: Family Medicine

## 2021-03-29 DIAGNOSIS — M8580 Other specified disorders of bone density and structure, unspecified site: Secondary | ICD-10-CM

## 2021-03-29 DIAGNOSIS — M9904 Segmental and somatic dysfunction of sacral region: Secondary | ICD-10-CM | POA: Diagnosis not present

## 2021-03-29 DIAGNOSIS — M9903 Segmental and somatic dysfunction of lumbar region: Secondary | ICD-10-CM | POA: Diagnosis not present

## 2021-03-29 DIAGNOSIS — M9902 Segmental and somatic dysfunction of thoracic region: Secondary | ICD-10-CM | POA: Diagnosis not present

## 2021-03-29 NOTE — Telephone Encounter (Signed)
Pt came in and ask for a referral for a bone density to [facility]. Please advise pt at [phone]. Pt was advise [facility] out of office.

## 2021-03-30 ENCOUNTER — Telehealth: Payer: Self-pay | Admitting: Family Medicine

## 2021-03-30 NOTE — Telephone Encounter (Signed)
Left message advising referral for bone density has been done

## 2021-03-31 DIAGNOSIS — M9904 Segmental and somatic dysfunction of sacral region: Secondary | ICD-10-CM | POA: Diagnosis not present

## 2021-03-31 DIAGNOSIS — M9902 Segmental and somatic dysfunction of thoracic region: Secondary | ICD-10-CM | POA: Diagnosis not present

## 2021-03-31 DIAGNOSIS — M5451 Vertebrogenic low back pain: Secondary | ICD-10-CM | POA: Diagnosis not present

## 2021-03-31 DIAGNOSIS — M9903 Segmental and somatic dysfunction of lumbar region: Secondary | ICD-10-CM | POA: Diagnosis not present

## 2021-04-02 ENCOUNTER — Other Ambulatory Visit: Payer: Self-pay | Admitting: Family Medicine

## 2021-04-02 DIAGNOSIS — Z1231 Encounter for screening mammogram for malignant neoplasm of breast: Secondary | ICD-10-CM

## 2021-04-02 DIAGNOSIS — M9902 Segmental and somatic dysfunction of thoracic region: Secondary | ICD-10-CM | POA: Diagnosis not present

## 2021-04-02 DIAGNOSIS — M5451 Vertebrogenic low back pain: Secondary | ICD-10-CM | POA: Diagnosis not present

## 2021-04-02 DIAGNOSIS — M9903 Segmental and somatic dysfunction of lumbar region: Secondary | ICD-10-CM | POA: Diagnosis not present

## 2021-04-02 DIAGNOSIS — M9904 Segmental and somatic dysfunction of sacral region: Secondary | ICD-10-CM | POA: Diagnosis not present

## 2021-04-05 DIAGNOSIS — M9902 Segmental and somatic dysfunction of thoracic region: Secondary | ICD-10-CM | POA: Diagnosis not present

## 2021-04-05 DIAGNOSIS — M9903 Segmental and somatic dysfunction of lumbar region: Secondary | ICD-10-CM | POA: Diagnosis not present

## 2021-04-05 DIAGNOSIS — M9904 Segmental and somatic dysfunction of sacral region: Secondary | ICD-10-CM | POA: Diagnosis not present

## 2021-05-09 NOTE — Progress Notes (Signed)
Marilyn Dixon is a 70 y.o. female who presents for annual wellness visit ,CPE and follow-up on chronic medical conditions.  She has no particular concerns or complaints.  Her allergies are under good control.  She does have reflux disease which seems to be under good control.  She follows up regularly with ophthalmology concerning her glaucoma and is also had cataract extraction.  She does have a history of colonic polyps and is scheduled for routine follow-up on a 3-year basis.  She seems to doing well on her sertraline and rarely uses Klonopin.  Continues to do well on atorvastatin without muscle aches or pain.  Review of the record also indicates osteopenia and she does have a DEXA scan scheduled.  She has had some difficulty with hot flashes and is using cohosh for that.  Her marriage is going well.  She and her husband are retired and doing a lot of traveling and enjoying this.  Immunizations and Health Maintenance Immunization History  Administered Date(s) Administered   DT (Pediatric) 06/27/1997   Fluad Quad(high Dose 65+) 06/14/2019, 07/17/2020   Hepatitis A 06/19/2007, 07/22/2010   Influenza Split 09/23/1999, 07/20/2012, 06/20/2013   Influenza Whole 09/24/2002, 07/17/2009, 05/09/2010   Influenza, High Dose Seasonal PF 06/22/2015, 07/04/2016, 06/07/2017, 08/03/2018   Influenza,inj,Quad PF,6+ Mos 05/20/2014   Influenza-Unspecified 08/03/2018   PFIZER(Purple Top)SARS-COV-2 Vaccination 10/26/2019, 11/20/2019, 06/19/2020, 03/23/2021   Pneumococcal Conjugate-13 07/29/2013   Pneumococcal Polysaccharide-23 05/11/2005   Tdap 06/19/2007, 01/14/2011   Zoster Recombinat (Shingrix) 07/20/2017, 12/19/2017   Zoster, Live 07/22/2010   Health Maintenance Due  Topic Date Due   TETANUS/TDAP  01/13/2021   INFLUENZA VACCINE  04/19/2021    Last Pap smear: 2017 Last mammogram: 12/21 Last colonoscopy: 2021 Last DEXA: has one coming Jan.  Dentist: Dr. Tamala Julian Ophtho: Dr. Herbert Deaner Exercise: Weekly  walking  Other doctors caring for patient include: none  Advanced directives: Does Patient Have a Medical Advance Directive?: Yes Type of Advance Directive: Healthcare Power of [place], Living will Does patient want to make changes to medical advance directive?: Yes (ED - Information included in AVS) Copy of [place] in Chart?: No - copy requested  Depression screen:  See questionnaire below.  Depression screen [facility] 2/9 05/10/2021 02/26/2021 12/17/2019 12/11/2018 12/07/2017  Decreased Interest 0 0 1 0 0  Down, Depressed, Hopeless 0 0 0 0 0  PHQ - 2 Score 0 0 1 0 0    Fall Risk Screen: see questionnaire below. Fall Risk  05/10/2021 02/17/2021 12/17/2019 12/11/2018 12/07/2017  Falls in the past year? 1 1 0 0 No  Number falls in past yr: 0 0 - - -  Injury with Fall? 1 1 - - -  Comment arthritis flare up - - - -  Risk for fall due to : Other (Comment) Other (Comment) - - -  Follow up Falls evaluation completed Falls evaluation completed - - -    ADL screen:  See questionnaire below Functional Status Survey: Is the patient deaf or have difficulty hearing?: No Does the patient have difficulty seeing, even when wearing glasses/contacts?: No Does the patient have difficulty concentrating, remembering, or making decisions?: No Does the patient have difficulty walking or climbing stairs?: No Does the patient have difficulty dressing or bathing?: No Does the patient have difficulty doing errands alone such as visiting a doctor's office or shopping?: No   Review of Systems Constitutional: -, -unexpected weight change, -anorexia, -fatigue Allergy: -sneezing, -itching, -congestion Dermatology: denies changing moles, rash, lumps ENT: -  runny nose, -ear pain, -sore throat,  Cardiology:  -chest pain, -palpitations, -orthopnea, Respiratory: -cough, -shortness of breath, -dyspnea on exertion, -wheezing,  Gastroenterology: -abdominal pain, -nausea, -vomiting, -diarrhea, -constipation,  -dysphagia Hematology: -bleeding or bruising problems Musculoskeletal: -arthralgias, -myalgias, -joint swelling, -back pain, - Ophthalmology: -vision changes,  Urology: -dysuria, -difficulty urinating,  -urinary frequency, -urgency, incontinence Neurology: -, -numbness, , -memory loss, -falls, -dizziness    PHYSICAL EXAM:  BP 124/80   Pulse 74   Temp 98.1 F (36.7 C)   Ht 5' 3.25" (1.607 m)   Wt 157 lb (71.2 kg)   BMI 27.59 kg/m   General Appearance: Alert, cooperative, no distress, appears stated age Head: Normocephalic, without obvious abnormality, atraumatic Eyes: PERRL, conjunctiva/corneas clear, EOM's intact, fundi benign Ears: Normal TM's and external ear canals Nose: Nares normal, mucosa normal, no drainage or sinus tenderness Throat: Lips, mucosa, and tongue normal; teeth and gums normal Neck: Supple, no lymphadenopathy;  thyroid:  no enlargement/tenderness/nodules; no carotid bruit or JVD Lungs: Clear to auscultation bilaterally without wheezes, rales or ronchi; respirations unlabored Heart: Regular rate and rhythm, S1 and S2 normal, no murmur, rubor gallop Abdomen: Soft, non-tender, nondistended, normoactive bowel sounds,  no masses, no hepatosplenomegaly Skin:  Skin color, texture, turgor normal, no rashes or lesions Lymph nodes: Cervical, supraclavicular, and axillary nodes normal Neurologic:  CNII-XII intact, normal strength, sensation and gait; reflexes 2+ and symmetric throughout Psych: Normal mood, affect, hygiene and grooming.  ASSESSMENT/PLAN: Routine general medical examination at a health care facility - Plan: CBC with Differential/Platelet, Comprehensive metabolic panel, Lipid panel  Osteopenia, unspecified location  Status post cataract extraction, unspecified laterality  Glaucoma, unspecified glaucoma type, unspecified laterality  Anxiety  Hyperlipidemia LDL goal <100 - Plan: Lipid panel  Non-seasonal allergic rhinitis due to pollen  History  of colonic polyps - Plan: CBC with Differential/Platelet, Comprehensive metabolic panel  Seasonal affective disorder (HCC)  Osteopenia after menopause - Plan: CBC with Differential/Platelet, Comprehensive metabolic panel  Tubular adenoma    Discussed monthly self breast exams and yearly mammograms; at least 30 minutes of aerobic activity at least 5 days/week and weight-bearing exercise 2x/week; proper sunscreen use reviewed; healthy diet, including goals of calcium and vitamin D intake and alcohol recommendations (less than or equal to 1 drink/day) reviewed; regular seatbelt use; changing batteries in smoke detectors.  Immunization recommendations discussed.  Colonoscopy recommendations reviewed   Medicare Attestation I have personally reviewed: The patient's medical and social history Their use of alcohol, tobacco or illicit drugs Their current medications and supplements The patient's functional ability including ADLs,fall risks, home safety risks, cognitive, and hearing and visual impairment Diet and physical activities Evidence for depression or mood disorders  The patient's weight, height, and BMI have been recorded in the chart.  I have made referrals, counseling, and provided education to the patient based on review of the above and I have provided the patient with a written personalized care plan for preventive services.     Jill Alexanders, MD   05/10/2021

## 2021-05-10 ENCOUNTER — Other Ambulatory Visit: Payer: Self-pay

## 2021-05-10 ENCOUNTER — Encounter: Payer: Self-pay | Admitting: Family Medicine

## 2021-05-10 ENCOUNTER — Ambulatory Visit (INDEPENDENT_AMBULATORY_CARE_PROVIDER_SITE_OTHER): Payer: Medicare HMO | Admitting: Family Medicine

## 2021-05-10 VITALS — BP 124/80 | HR 74 | Temp 98.1°F | Ht 63.25 in | Wt 157.0 lb

## 2021-05-10 DIAGNOSIS — Z86010 Personal history of colonic polyps: Secondary | ICD-10-CM

## 2021-05-10 DIAGNOSIS — E785 Hyperlipidemia, unspecified: Secondary | ICD-10-CM | POA: Diagnosis not present

## 2021-05-10 DIAGNOSIS — J301 Allergic rhinitis due to pollen: Secondary | ICD-10-CM | POA: Diagnosis not present

## 2021-05-10 DIAGNOSIS — Z9849 Cataract extraction status, unspecified eye: Secondary | ICD-10-CM

## 2021-05-10 DIAGNOSIS — Z780 Asymptomatic menopausal state: Secondary | ICD-10-CM | POA: Diagnosis not present

## 2021-05-10 DIAGNOSIS — D369 Benign neoplasm, unspecified site: Secondary | ICD-10-CM

## 2021-05-10 DIAGNOSIS — H409 Unspecified glaucoma: Secondary | ICD-10-CM

## 2021-05-10 DIAGNOSIS — F338 Other recurrent depressive disorders: Secondary | ICD-10-CM

## 2021-05-10 DIAGNOSIS — Z0000 Encounter for general adult medical examination without abnormal findings: Secondary | ICD-10-CM | POA: Diagnosis not present

## 2021-05-10 DIAGNOSIS — R232 Flushing: Secondary | ICD-10-CM

## 2021-05-10 DIAGNOSIS — F419 Anxiety disorder, unspecified: Secondary | ICD-10-CM

## 2021-05-10 DIAGNOSIS — M8580 Other specified disorders of bone density and structure, unspecified site: Secondary | ICD-10-CM | POA: Diagnosis not present

## 2021-05-10 DIAGNOSIS — R69 Illness, unspecified: Secondary | ICD-10-CM | POA: Diagnosis not present

## 2021-05-10 MED ORDER — SERTRALINE HCL 100 MG PO TABS: 100.0000 mg | ORAL_TABLET | Freq: Every day | ORAL | 1 refills | Status: DC

## 2021-05-10 MED ORDER — ATORVASTATIN CALCIUM 10 MG PO TABS: 10.0000 mg | ORAL_TABLET | Freq: Every day | ORAL | 0 refills | Status: DC

## 2021-05-11 LAB — CBC WITH DIFFERENTIAL/PLATELET
Basophils Absolute: 0 10*3/uL (ref 0.0–0.2)
Basos: 0 %
EOS (ABSOLUTE): 0.1 10*3/uL (ref 0.0–0.4)
Eos: 1 %
Hematocrit: 44.2 % (ref 34.0–46.6)
Hemoglobin: 15.2 g/dL (ref 11.1–15.9)
Immature Grans (Abs): 0 10*3/uL (ref 0.0–0.1)
Immature Granulocytes: 0 %
Lymphocytes Absolute: 1.5 10*3/uL (ref 0.7–3.1)
Lymphs: 19 %
MCH: 30.8 pg (ref 26.6–33.0)
MCHC: 34.4 g/dL (ref 31.5–35.7)
MCV: 90 fL (ref 79–97)
Monocytes Absolute: 0.5 10*3/uL (ref 0.1–0.9)
Monocytes: 6 %
Neutrophils Absolute: 5.8 10*3/uL (ref 1.4–7.0)
Neutrophils: 74 %
Platelets: 247 10*3/uL (ref 150–450)
RBC: 4.94 x10E6/uL (ref 3.77–5.28)
RDW: 12.3 % (ref 11.7–15.4)
WBC: 7.9 10*3/uL (ref 3.4–10.8)

## 2021-05-11 LAB — COMPREHENSIVE METABOLIC PANEL
ALT: 20 IU/L (ref 0–32)
AST: 21 IU/L (ref 0–40)
Albumin/Globulin Ratio: 1.8 (ref 1.2–2.2)
Albumin: 4.6 g/dL (ref 3.7–4.7)
Alkaline Phosphatase: 162 IU/L — ABNORMAL HIGH (ref 44–121)
BUN/Creatinine Ratio: 18 (ref 12–28)
BUN: 14 mg/dL (ref 8–27)
Bilirubin Total: 0.4 mg/dL (ref 0.0–1.2)
CO2: 25 mmol/L (ref 20–29)
Calcium: 9.8 mg/dL (ref 8.7–10.3)
Chloride: 103 mmol/L (ref 96–106)
Creatinine, Ser: 0.77 mg/dL (ref 0.57–1.00)
Globulin, Total: 2.5 g/dL (ref 1.5–4.5)
Glucose: 92 mg/dL (ref 65–99)
Potassium: 4 mmol/L (ref 3.5–5.2)
Sodium: 141 mmol/L (ref 134–144)
Total Protein: 7.1 g/dL (ref 6.0–8.5)
eGFR: 82 mL/min/{1.73_m2} (ref >59)

## 2021-05-11 LAB — LIPID PANEL
Chol/HDL Ratio: 3.6 ratio (ref 0.0–4.4)
Cholesterol, Total: 204 mg/dL — ABNORMAL HIGH (ref 100–199)
HDL: 57 mg/dL (ref >39)
LDL Chol Calc (NIH): 123 mg/dL — ABNORMAL HIGH (ref 0–99)
Triglycerides: 133 mg/dL (ref 0–149)
VLDL Cholesterol Cal: 24 mg/dL (ref 5–40)

## 2021-07-08 DIAGNOSIS — Z872 Personal history of diseases of the skin and subcutaneous tissue: Secondary | ICD-10-CM | POA: Diagnosis not present

## 2021-07-08 DIAGNOSIS — D485 Neoplasm of uncertain behavior of skin: Secondary | ICD-10-CM | POA: Diagnosis not present

## 2021-07-08 DIAGNOSIS — L814 Other melanin hyperpigmentation: Secondary | ICD-10-CM | POA: Diagnosis not present

## 2021-07-08 DIAGNOSIS — D225 Melanocytic nevi of trunk: Secondary | ICD-10-CM | POA: Diagnosis not present

## 2021-07-08 DIAGNOSIS — L7211 Pilar cyst: Secondary | ICD-10-CM | POA: Diagnosis not present

## 2021-07-08 DIAGNOSIS — L280 Lichen simplex chronicus: Secondary | ICD-10-CM | POA: Diagnosis not present

## 2021-07-08 DIAGNOSIS — L821 Other seborrheic keratosis: Secondary | ICD-10-CM | POA: Diagnosis not present

## 2021-07-14 ENCOUNTER — Other Ambulatory Visit: Payer: Self-pay

## 2021-07-14 ENCOUNTER — Other Ambulatory Visit (INDEPENDENT_AMBULATORY_CARE_PROVIDER_SITE_OTHER): Payer: Medicare HMO

## 2021-07-14 DIAGNOSIS — Z23 Encounter for immunization: Secondary | ICD-10-CM | POA: Diagnosis not present

## 2021-08-09 ENCOUNTER — Other Ambulatory Visit: Payer: Self-pay

## 2021-08-09 ENCOUNTER — Ambulatory Visit (INDEPENDENT_AMBULATORY_CARE_PROVIDER_SITE_OTHER): Payer: Medicare HMO | Admitting: Family Medicine

## 2021-08-09 ENCOUNTER — Encounter: Payer: Self-pay | Admitting: Family Medicine

## 2021-08-09 VITALS — BP 140/88 | HR 66 | Temp 97.7°F | Wt 152.2 lb

## 2021-08-09 DIAGNOSIS — M7711 Lateral epicondylitis, right elbow: Secondary | ICD-10-CM | POA: Diagnosis not present

## 2021-08-09 DIAGNOSIS — Z23 Encounter for immunization: Secondary | ICD-10-CM

## 2021-08-09 NOTE — Progress Notes (Signed)
   Subjective:    Patient ID: Marilyn Dixon, female    DOB: 1951-03-15, 70 y.o.   MRN: [id number]  HPI She states that on Saturday she woke up and noted right forearm pain.  No history of overuse or injury.  She notes increased pain when she grips an object however with her hand supinated, causes much less difficulty.   Review of Systems     Objective:   Physical Exam Full motion of the arm without difficulty.  No pain on passive motion of the elbow.  Tender to palpation over the lateral epicondyle.  Pain with gripping a small object over the lateral epicondyle.       Assessment & Plan:  Lateral epicondylitis of right elbow  Need for COVID-19 vaccine - Plan: Pension scheme manager Information given concerning treatment of lateral epicondylitis.  Recommend heat for 20 minutes 3 times per day, 2 Aleve twice per day and do his me think palms up and open is possible.  Discussed further therapy with physical therapy if we need to.

## 2021-08-09 NOTE — Patient Instructions (Signed)
Tennis Elbow Tennis elbow is irritation and swelling (inflammation) in your outer forearm, near your elbow. Swelling affects the tissues that connect muscle to bone (tendons). Tennis elbow can happen playing any sport or doing any job where you use your elbow too much. It is caused by doing the same motion over and over. What are the causes? This condition is often caused by playing sports or doing work where you need to keep moving your forearm the same way. Sometimes, it may be caused by a sudden injury. What increases the risk? You are more likely to get tennis elbow if you play tennis or another racket sport. You also have a higher risk if you often use your hands for work. This includes: People who use computers. Architect workers. People who work in a factory. Musicians. Cooks. Cashiers. What are the signs or symptoms? Pain and tenderness in your forearm and the outer part of your elbow. You may have pain all the time or only when you use your arm. A burning feeling. This starts in your elbow and spreads down your arm. A weak grip in your hand. How is this treated? Resting and icing your arm is often the first treatment. Your doctor may also recommend: Medicines to reduce pain and swelling. An elbow strap. Physical therapy. This may include massage or exercises or both. An elbow brace. If these do not help your symptoms get better, your doctor may recommend surgery. Follow these instructions at home: If you have a brace or strap: Wear the brace or strap as told by your doctor. Take it off only as told by your doctor. Check the skin around the brace or strap every day. Tell your doctor if you see problems. Loosen it if your fingers: Tingle. Become numb. Turn cold and blue. Keep the brace or strap clean. If the brace or strap is not waterproof: Do not let it get wet. Cover it with a watertight covering when you take a bath or a shower. Managing pain, stiffness, and  swelling  If told, put ice on the injured area. To do this: If you have a removable brace or strap, take it off as told by your doctor. Put ice in a plastic bag. Place a towel between your skin and the bag. Leave the ice on for 20 minutes, 2-3 times a day. Take off the ice if your skin turns bright red. This is very important. If you cannot feel pain, heat, or cold, you have a greater risk of damage to the area. Move your fingers often. Activity Rest your elbow and wrist. Avoid activities that can cause elbow problems as told by your doctor. Do exercises as told by your doctor. If you lift an object, lift it with your palm facing up. Lifestyle If your tennis elbow is caused by sports, check your equipment and make sure that: You are using it the right way. It fits you well. If your tennis elbow is caused by work or computer use, take breaks often to stretch your arm. Talk with your manager about how you can make your condition better at work. General instructions Take over-the-counter and prescription medicines only as told by your doctor. Do not smoke or use any products that contain nicotine or tobacco. If you need help quitting, ask your doctor. Keep all follow-up visits. How is this prevented? Before and after being active: Warm up and stretch before being active. Cool down and stretch after being active. Give your body time to rest  between activities. While being active: Make sure to use equipment that fits you. If you play tennis, put power in your stroke with your lower body. Avoid using your arm only. Maintain physical fitness. This includes: Strength. Flexibility. Endurance. Do exercises to strengthen the forearm muscles. Contact a doctor if: Your pain does not get better with treatment. Your pain gets worse. You have weakness in your forearm, hand, or fingers. You cannot feel your forearm, hand, or fingers. Get help right away if: Your pain is very bad. You cannot  move your wrist. Summary Tennis elbow is irritation and swelling (inflammation) in your outer forearm, near your elbow. Tennis elbow is caused by doing the same motion over and over. Rest your elbow and wrist. Avoid activities as told by your doctor. If told, put ice on the injured area for 20 minutes, 2-3 times a day. This information is not intended to replace advice given to you by your health care provider. Make sure you discuss any questions you have with your health care provider. Heat for 20 minutes 3 times per day.  2 Aleve twice per day.  Do his main things palms up and open Document Revised: 03/17/2020 Document Reviewed: 03/17/2020 Elsevier Patient Education  [place].

## 2021-08-19 ENCOUNTER — Telehealth: Payer: Self-pay

## 2021-08-19 DIAGNOSIS — M7711 Lateral epicondylitis, right elbow: Secondary | ICD-10-CM

## 2021-08-19 NOTE — Telephone Encounter (Signed)
Pt stopped by and states pain is about an 8, pain wakes up her up at night, using heat and Aleve and rest, not better.  Now shoulder and neck hurting.  She would like referral to PT wherever is fine that takes her insurance.  Would like somewhere tomorrow if anywhere has anything because she is in such pain.

## 2021-08-20 ENCOUNTER — Ambulatory Visit
Admission: RE | Admit: 2021-08-20 | Discharge: 2021-08-20 | Disposition: A | Discharge status: Home / Self Care | Payer: Medicare HMO | Source: Ambulatory Visit | Attending: Family Medicine | Admitting: Family Medicine

## 2021-08-20 ENCOUNTER — Telehealth: Payer: Self-pay

## 2021-08-20 DIAGNOSIS — Z1231 Encounter for screening mammogram for malignant neoplasm of breast: Secondary | ICD-10-CM | POA: Diagnosis not present

## 2021-08-20 NOTE — Telephone Encounter (Signed)
done

## 2021-08-26 ENCOUNTER — Encounter: Payer: Self-pay | Admitting: Rehabilitative and Restorative Service Providers"

## 2021-08-26 ENCOUNTER — Ambulatory Visit: Payer: Medicare HMO | Admitting: Rehabilitative and Restorative Service Providers"

## 2021-08-26 ENCOUNTER — Other Ambulatory Visit: Payer: Self-pay

## 2021-08-26 DIAGNOSIS — M25621 Stiffness of right elbow, not elsewhere classified: Secondary | ICD-10-CM | POA: Diagnosis not present

## 2021-08-26 DIAGNOSIS — M25521 Pain in right elbow: Secondary | ICD-10-CM | POA: Diagnosis not present

## 2021-08-26 DIAGNOSIS — M25631 Stiffness of right wrist, not elsewhere classified: Secondary | ICD-10-CM

## 2021-08-26 DIAGNOSIS — M6281 Muscle weakness (generalized): Secondary | ICD-10-CM | POA: Diagnosis not present

## 2021-08-26 DIAGNOSIS — R600 Localized edema: Secondary | ICD-10-CM | POA: Diagnosis not present

## 2021-08-26 NOTE — Patient Instructions (Addendum)
Access Code: [email or web address] URL: [email or web address] Date: 08/26/2021 Prepared by: Vista Mink  Exercises Elbow PROM/AAROM Flexion & Extension Neutral Forearm - 5 x daily - 7 x weekly - 1 sets - 10 reps - 3-5 seconds hold Isometric Wrist Extension Pronated - 5 x daily - 7 x weekly - 1 sets - 10 reps - 5 seconds hold Wrist Extension Drop Eccentrics with Palm Down (passive up, slow eccentric down) - 5 x daily - 7 x weekly - 1 sets - 10 reps

## 2021-08-26 NOTE — Therapy (Signed)
[place], [place], [address] Phone: [phone]   Fax:  [phone]  Physical Therapy Evaluation  Patient Details  Name: Marilyn Dixon MRN: [id number] Date of Birth: 13-Jun-1951 Referring Provider (PT): Denita Lung MD   Encounter Date: 08/26/2021   PT End of Session - 08/26/21 1654     Visit Number 1    Number of Visits 12    Date for PT Re-Evaluation 10/21/21    Progress Note Due on Visit 10    PT Start Time 1150    PT Stop Time 1234    PT Time Calculation (min) 44 min    Activity Tolerance Patient limited by pain    Behavior During Therapy [facility] for tasks assessed/performed             Past Medical History:  Diagnosis Date   Allergy    Back pain    Dyslipidemia    Glaucoma    High cholesterol    Hx of adenomatous colonic polyps    Menopause    Seasonal affective disorder [facility])     Past Surgical History:  Procedure Laterality Date   COLONOSCOPY  2006/2011   Schooler,Santogade    There were no vitals filed for this visit.    Subjective Assessment - 08/26/21 1650     Subjective Marilyn Dixon had an acute flare-up of lateral epicondylitis after a trip to [place].  Her R elbow is very painful to the point it is affecting her sleep.    Pertinent History NA    Limitations Lifting;House hold activities;Writing    Patient Stated Goals Be able to use her R arm without pain or restriction.    Currently in Pain? Yes    Pain Score 7     Pain Location Elbow    Pain Orientation Right    Pain Descriptors / Indicators Tender;Sharp;Burning;Tightness;Constant    Pain Type Acute pain    Pain Radiating Towards R lateral epicondyle    Pain Onset 1 to 4 weeks ago    Pain Frequency Constant    Aggravating Factors  R UE use    Pain Relieving Factors Hurts all the time    Effect of Pain on Daily Activities Affects all R use use and sleep    Multiple Pain Sites No                 [facility] PT Assessment - 08/26/21 0001       Assessment   Medical Diagnosis R lateral epicondylitis    Referring Provider (PT) Denita Lung MD    Onset Date/Surgical Date 08/07/21    Hand Dominance Right      Precautions   Precaution Comments Avoid R UE overuse      Restrictions   Weight Bearing Restrictions Yes    RUE Weight Bearing Weight bearing as tolerated    Other Position/Activity Restrictions Avoid repetitive grip/grasp      Balance Screen   Has the patient fallen in the past 6 months No    Has the patient had a decrease in activity level because of a fear of falling?  No    Is the patient reluctant to leave their home because of a fear of falling?  No      Home Environment   Living Environment Private residence    Available Help at Discharge Family    Additional Comments Limited with R UE use      Prior  Function   Level of Independence Independent    Vocation Retired    Leisure For some reason follows [employer]      Cognition   Overall Cognitive Status Within Functional Limits for tasks assessed      Observation/Other Assessments   Focus on Therapeutic Outcomes (FOTO)  27 (Goal 59 in 13 visits)      ROM / Strength   AROM / PROM / Strength AROM;Strength      AROM   Overall AROM  Deficits    AROM Assessment Site Wrist;Elbow    Right/Left Elbow Left;Right    Right Elbow Flexion 102    Right Elbow Extension -56    Left Elbow Flexion 150    Left Elbow Extension -12    Right/Left Wrist Right    Right Wrist Extension 50 Degrees    Right Wrist Flexion 40 Degrees    Left Wrist Extension 60 Degrees    Left Wrist Flexion 70 Degrees      Strength   Overall Strength Comments Grip strength (L/R in pounds): 44.4 pounds/19.9 pounds                        Objective measurements completed on examination: See above findings.       [facility] Adult PT Treatment/Exercise - 08/26/21 0001       Exercises   Exercises Wrist;Elbow      Elbow  Exercises   Elbow Flexion AAROM;Right;10 reps;Standing    Elbow Extension AAROM;Right;10 reps;Standing      Wrist Exercises   Wrist Extension Strengthening;Right;10 reps;Seated;Limitations    Wrist Extension Limitations Drop eccentrics (passive into extension, slow controlled active into flexion) 2 sets of 10    Other wrist exercises Wrist extension Isometrics 2 sets of 10 for 3 seconds                     PT Education - 08/26/21 1653     Education Details Discussed elbow anatomy, likely mechanism of injury, importance of relative rest and avoiding overuse, exam findings and starter HEP.    Person(s) Educated Patient    Methods Explanation;Handout;Demonstration;Verbal cues    Comprehension Verbalized understanding;Need further instruction;Returned demonstration;Verbal cues required              PT Short Term Goals - 08/26/21 1659       PT SHORT TERM GOAL #1   Title Improve R wrist and elbow AROM to 98% or greater as compared to the uninvolved L.    Baseline Severely impaired (see objective).    Time 4    Period Weeks    Status New    Target Date 09/23/21      PT SHORT TERM GOAL #2   Title Improve R grip strength to > 30 pounds.    Baseline < 20 pounds    Time 4    Period Weeks    Status New    Target Date 09/23/21               PT Long Term Goals - 08/26/21 1700       PT LONG TERM GOAL #1   Title Improve FOTO to 59 in 13 visits or less.    Baseline 27    Time 8    Period Weeks    Status New    Target Date 10/21/21      PT LONG TERM GOAL #2   Title Improve R elbow pain to consistently  0-2/10 on the Numeric Pain Rating Scale.    Baseline 7/10    Time 8    Period Weeks    Status New    Target Date 10/21/21      PT LONG TERM GOAL #3   Title Improve R grip strength to 45 pounds or greater.    Baseline < 20 pounds    Time 8    Period Weeks    Status New    Target Date 10/21/21      PT LONG TERM GOAL #4   Title Marilyn Dixon will be  independent with her long-term HEP at [place].    Baseline Started today    Time 8    Period Weeks    Status New    Target Date 10/21/21                    Plan - 08/26/21 1655     Clinical Impression Statement Marilyn Dixon has an acute lateral epicondylitis that severely limits her R UE function.  We reviewed anatomy, overuse education, her HEP and the importance of icing.  Returning full R wrist and elbow AROM, gradual progression of R wrist extensors, elbow and overall shoulder strength while continue to avoid overuse remain the priority.  Her prognosis is good to meet the below listed goals.    Examination-Activity Limitations Reach Overhead;Self Feeding;Dressing;Hygiene/Grooming;Toileting;Sleep;Lift;Carry    Examination-Participation Restrictions Interpersonal Relationship;Cleaning;Community Activity;Driving;Meal Prep    Stability/Clinical Decision Making Stable/Uncomplicated    Clinical Decision Making Low    Rehab Potential Good    PT Frequency 2x / week    PT Duration 8 weeks   Likely closer to 4-6 weeks   PT Treatment/Interventions ADLs/Self Care Home Management;Electrical Stimulation;Cryotherapy;Iontophoresis 4mg /ml Dexamethasone;Therapeutic activities;Neuromuscular re-education;Therapeutic exercise;Patient/family education;Manual techniques;Dry needling    PT Next Visit Plan Review HEP and progress elbow and wrist AROM as able.  Progress wrist extensors, elbow and general UE strength while avoiding overuse.    PT Home Exercise Plan Access Code: [email or web address]    Consulted and Agree with Plan of Care Patient             Patient will benefit from skilled therapeutic intervention in order to improve the following deficits and impairments:  Decreased activity tolerance, Decreased endurance, Decreased range of motion, Decreased strength, Increased edema, Impaired perceived functional ability, Impaired UE functional use, Pain  Visit Diagnosis: Pain in right elbow  Stiffness of right  elbow, not elsewhere classified  Stiffness of right wrist, not elsewhere classified  Muscle weakness (generalized)  Localized edema     Problem List Patient Active Problem List   Diagnosis Date Noted   Hot flashes 05/10/2021   Status post cataract extraction 12/11/2018   Gastroesophageal reflux disease without esophagitis 12/07/2017   History of colonic polyps 02/19/2016   Seasonal affective disorder ([place]) 08/17/2015   Glaucoma 07/26/2011   Allergic rhinitis due to pollen 07/26/2011   Hyperlipidemia LDL goal <100 07/26/2011    Farley Ly, PT, MPT 08/26/2021, 5:04 PM  [facility] Physical Therapy [address] [place], [place], [address] Phone: [phone]   Fax:  [phone]  Name: Marilyn Dixon MRN: [id number] Date of Birth: Jun 18, 1951

## 2021-08-31 ENCOUNTER — Other Ambulatory Visit: Payer: Self-pay

## 2021-08-31 ENCOUNTER — Ambulatory Visit: Payer: Medicare HMO | Admitting: Physical Therapy

## 2021-08-31 ENCOUNTER — Encounter: Payer: Self-pay | Admitting: Physical Therapy

## 2021-08-31 DIAGNOSIS — M25521 Pain in right elbow: Secondary | ICD-10-CM

## 2021-08-31 DIAGNOSIS — M25621 Stiffness of right elbow, not elsewhere classified: Secondary | ICD-10-CM | POA: Diagnosis not present

## 2021-08-31 DIAGNOSIS — R600 Localized edema: Secondary | ICD-10-CM

## 2021-08-31 DIAGNOSIS — M25631 Stiffness of right wrist, not elsewhere classified: Secondary | ICD-10-CM | POA: Diagnosis not present

## 2021-08-31 DIAGNOSIS — M6281 Muscle weakness (generalized): Secondary | ICD-10-CM | POA: Diagnosis not present

## 2021-08-31 NOTE — Therapy (Signed)
[place], [place], [address] Phone: [phone]   Fax:  [phone]  Physical Therapy Treatment  Patient Details  Name: Marilyn Dixon MRN: [id number] Date of Birth: 10-Apr-1951 Referring Provider (PT): Denita Lung MD   Encounter Date: 08/31/2021   PT End of Session - 08/31/21 1344     Visit Number 2    Number of Visits 12    Date for PT Re-Evaluation 10/21/21    Progress Note Due on Visit 10    PT Start Time 0930    PT Stop Time 1010    PT Time Calculation (min) 40 min    Activity Tolerance Patient limited by pain    Behavior During Therapy [facility] for tasks assessed/performed             Past Medical History:  Diagnosis Date   Allergy    Back pain    Dyslipidemia    Glaucoma    High cholesterol    Hx of adenomatous colonic polyps    Menopause    Seasonal affective disorder [facility])     Past Surgical History:  Procedure Laterality Date   COLONOSCOPY  2006/2011   Schooler,Santogade    There were no vitals filed for this visit.   Subjective Assessment - 08/31/21 0938     Subjective Pt arriving today reporting concerns about sleeping and she has been walking up at night due to pain in her right elbow.    Limitations Lifting;House hold activities;Writing    Patient Stated Goals Be able to use her R arm without pain or restriction.    Currently in Pain? Yes    Pain Score 6     Pain Location Elbow    Pain Orientation Right    Pain Descriptors / Indicators Aching;Tender;Burning    Pain Type Acute pain    Pain Onset 1 to 4 weeks ago    Pain Frequency Constant                               [facility] Adult PT Treatment/Exercise - 08/31/21 0001       Exercises   Exercises Wrist;Elbow      Elbow Exercises   Elbow Flexion AAROM;Right;Standing;15 reps;Supine    Bar Weights/Barbell (Elbow Flexion) 2 lbs    Elbow Extension AAROM;Right;10 reps;Standing    Forearm Supination  Strengthening;Right;10 reps;Seated    Bar Weights/Barbell (Forearm Supination) 2 lbs    Forearm Pronation Strengthening;Right;10 reps;Seated    Bar Weights/Barbell (Forearm Pronation) 2 lbs      Wrist Exercises   Wrist Extension Limitations wrist extension dropped off edge of mat table while sitting, with slow controlled eccentrics   also performed with 1# weight x 10     Modalities   Modalities Iontophoresis      Iontophoresis   Type of Iontophoresis Dexamethasone    Location wrist extensors    Dose 75ml    Time 4 hour patch      Manual Therapy   Manual therapy comments skilled palpation to wrist extensors, STM over active trigger points              Trigger Point Dry Needling - 08/31/21 0001     Consent Given? Yes    Education Handout Provided Yes    Muscles Treated Wrist/Hand Extensor carpi radialis longus/brevis;Extensor digitorum;Extensor carpi ulnaris    Dry Needling Comments pt in supine position with good response to  needling    Extensor carpi radialis longus/brevis Response Twitch response elicited    Extensor digitorum Response Twitch response elicited    Extensor carpi ulnaris Response Twitch response elicited                     PT Short Term Goals - 08/31/21 1602       PT SHORT TERM GOAL #1   Title Improve R wrist and elbow AROM to 98% or greater as compared to the uninvolved L.    Baseline Severely impaired (see objective).    Status On-going      PT SHORT TERM GOAL #2   Title Improve R grip strength to > 30 pounds.    Status On-going               PT Long Term Goals - 08/31/21 1053       PT LONG TERM GOAL #1   Title Improve FOTO to 59 in 13 visits or less.    Status On-going      PT LONG TERM GOAL #2   Title Improve R elbow pain to consistently 0-2/10 on the Numeric Pain Rating Scale.    Status On-going      PT LONG TERM GOAL #3   Title Improve R grip strength to 45 pounds or greater.    Status On-going      PT LONG TERM  GOAL #4   Title Marilyn Dixon will be independent with her long-term HEP at [place].    Status On-going                   Plan - 08/31/21 0930     Clinical Impression Statement Pt arriving today reporting increased pain in her right elbow. Pt reproting increasd pain with sleeping. Pt was edu on positioning while sleeping. Pt tolerating exercises well along with Dry Needling with twtich responses noted in extensors. Ionto patch placed and pt edu in removal. Continue skilled PT to maximize pt's function.    Examination-Activity Limitations Reach Overhead;Self Feeding;Dressing;Hygiene/Grooming;Toileting;Sleep;Lift;Carry    Examination-Participation Restrictions Interpersonal Relationship;Cleaning;Community Activity;Driving;Meal Prep    Stability/Clinical Decision Making Stable/Uncomplicated    Rehab Potential Good    PT Frequency 2x / week    PT Duration 8 weeks   Likely closer to 4-6 weeks   PT Treatment/Interventions ADLs/Self Care Home Management;Electrical Stimulation;Cryotherapy;Iontophoresis 4mg /ml Dexamethasone;Therapeutic activities;Neuromuscular re-education;Therapeutic exercise;Patient/family education;Manual techniques;Dry needling    PT Next Visit Plan Assess response to DN and Ionto.  Progress wrist extensors, elbow and general UE strength while avoiding overuse.    PT Home Exercise Plan pt to bring access code form home, due to the one entered did not contain pt's specific exercises    Consulted and Agree with Plan of Care Patient             Patient will benefit from skilled therapeutic intervention in order to improve the following deficits and impairments:  Decreased activity tolerance, Decreased endurance, Decreased range of motion, Decreased strength, Increased edema, Impaired perceived functional ability, Impaired UE functional use, Pain  Visit Diagnosis: Pain in right elbow  Stiffness of right elbow, not elsewhere classified  Stiffness of right wrist, not elsewhere  classified  Muscle weakness (generalized)  Localized edema     Problem List Patient Active Problem List   Diagnosis Date Noted   Hot flashes 05/10/2021   Status post cataract extraction 12/11/2018   Gastroesophageal reflux disease without esophagitis 12/07/2017   History of colonic polyps 02/19/2016  Seasonal affective disorder ([place]) 08/17/2015   Glaucoma 07/26/2011   Allergic rhinitis due to pollen 07/26/2011   Hyperlipidemia LDL goal <100 07/26/2011    Oretha Caprice, PT, MPT 08/31/2021, 4:02 PM  [facility] Physical Therapy [address] [place], [place], [address] Phone: [phone]   Fax:  [phone]  Name: Marilyn Dixon MRN: [id number] Date of Birth: [birth year]

## 2021-09-06 ENCOUNTER — Ambulatory Visit: Payer: Medicare HMO | Admitting: Physical Therapy

## 2021-09-09 ENCOUNTER — Ambulatory Visit: Payer: Medicare HMO | Admitting: Rehabilitative and Restorative Service Providers"

## 2021-09-09 ENCOUNTER — Other Ambulatory Visit: Payer: Self-pay

## 2021-09-09 ENCOUNTER — Encounter: Payer: Self-pay | Admitting: Rehabilitative and Restorative Service Providers"

## 2021-09-09 DIAGNOSIS — M25521 Pain in right elbow: Secondary | ICD-10-CM | POA: Diagnosis not present

## 2021-09-09 DIAGNOSIS — M25621 Stiffness of right elbow, not elsewhere classified: Secondary | ICD-10-CM | POA: Diagnosis not present

## 2021-09-09 DIAGNOSIS — R600 Localized edema: Secondary | ICD-10-CM

## 2021-09-09 DIAGNOSIS — M6281 Muscle weakness (generalized): Secondary | ICD-10-CM | POA: Diagnosis not present

## 2021-09-09 DIAGNOSIS — M25631 Stiffness of right wrist, not elsewhere classified: Secondary | ICD-10-CM

## 2021-09-09 NOTE — Patient Instructions (Signed)
Access Code: [email or web address] URL: [email or web address] Date: 09/09/2021 Prepared by: Vista Mink  Exercises Isometric Wrist Extension Pronated - 5 x daily - 7 x weekly - 1 sets - 10 reps - 5 seconds hold Wrist Flexion Extension AROM with Fingers Curled and Palm Down - 5 x daily - 7 x weekly - 1 sets - 20-30 reps Elbow AROM Blocked Extension in Neutral Position - 3-5 x daily - 7 x weekly - 1 sets - 10 reps - 5 seconds hold

## 2021-09-09 NOTE — Therapy (Signed)
[place], [place], [address] Phone: [phone]   Fax:  [phone]  Physical Therapy Treatment  Patient Details  Name: Marilyn Dixon MRN: [id number] Date of Birth: [birth year] Referring Provider (PT): Denita Lung MD   Encounter Date: 09/09/2021   PT End of Session - 09/09/21 0943     Visit Number 3    Number of Visits 12    Date for PT Re-Evaluation 10/21/21    Progress Note Due on Visit 10    PT Start Time 0804    PT Stop Time 0849    PT Time Calculation (min) 45 min    Activity Tolerance Patient tolerated treatment well    Behavior During Therapy [facility] for tasks assessed/performed             Past Medical History:  Diagnosis Date   Allergy    Back pain    Dyslipidemia    Glaucoma    High cholesterol    Hx of adenomatous colonic polyps    Menopause    Seasonal affective disorder [facility])     Past Surgical History:  Procedure Laterality Date   COLONOSCOPY  2006/2011   Schooler,Santogade    There were no vitals filed for this visit.   Subjective Assessment - 09/09/21 0939     Subjective Marilyn Dixon notes improved elbow AROM.  Pain still limits sleep and function.    Pertinent History NA    Limitations Lifting;House hold activities;Writing    Patient Stated Goals Be able to use her R arm without pain or restriction.    Currently in Pain? Yes    Pain Score 6     Pain Location Elbow    Pain Orientation Right    Pain Descriptors / Indicators Aching;Tender;Burning    Pain Type Acute pain    Pain Radiating Towards R lateral epicondyle and extensor mechanism    Pain Onset More than a month ago    Pain Frequency Constant    Aggravating Factors  R UE use and sleeping    Pain Relieving Factors Hurts constantly    Effect of Pain on Daily Activities Affects all R UE use and sleep    Multiple Pain Sites No                [facility] PT Assessment - 09/09/21 0001       AROM   AROM Assessment Site  Elbow    Right/Left Elbow Right    Right Elbow Flexion 140    Right Elbow Extension -35                           [facility] Adult PT Treatment/Exercise - 09/09/21 0001       Exercises   Exercises Wrist;Elbow      Elbow Exercises   Elbow Flexion AAROM;Right;20 reps;Standing    Elbow Extension AAROM;Right;20 reps;Standing      Wrist Exercises   Wrist Extension Strengthening;Right;10 reps;Seated;Limitations    Bar Weights/Barbell (Wrist Extension) 2 lbs    Wrist Extension Limitations Drop eccentrics (passive into extension, slow controlled active into flexion, palm down) 2 sets of 30   Required lots of interruption for feedback and correction   Other wrist exercises Wrist extension Isometrics 2 sets of 10 for 5 seconds      Manual Therapy   Manual Therapy Other (comment)    Manual therapy comments Dry needling with electrical stimulation R lateral elbow/extensor mechanism  Trigger Point Dry Needling - 09/09/21 0001     Consent Given? Yes    Education Handout Provided Previously provided    Muscles Treated Wrist/Hand Extensor carpi radialis longus/brevis;Extensor digitorum;Extensor carpi ulnaris    Dry Needling Comments with and without E stim mili amp frequency#2 to in intesitly level tolerated, good twitch responses noted and good overall tolerance to treatment                   PT Education - 09/09/21 0941     Education Details Marilyn Dixon needed a lot of feedback and correction with her day 1 HEP.  She was doing too much concentric work and had too much movement with her isometrics.  Several repetitions and corrections were required.  She did things more correctly by the end of the visit but will need checking next visit.    Person(s) Educated Patient    Methods Explanation;Demonstration;Handout;Tactile cues;Verbal cues    Comprehension Verbal cues required;Returned demonstration;Need further instruction;Tactile cues required;Verbalized  understanding              PT Short Term Goals - 09/09/21 0942       PT SHORT TERM GOAL #1   Title Improve R wrist and elbow AROM to 98% or greater as compared to the uninvolved L.    Baseline Better than eval 09/09/2021 but still severely impaired (see objective).    Time 4    Period Weeks    Status On-going    Target Date 09/23/21      PT SHORT TERM GOAL #2   Title Improve R grip strength to > 30 pounds.    Baseline < 20 pounds    Time 4    Period Weeks    Status On-going    Target Date 09/23/21               PT Long Term Goals - 08/31/21 1053       PT LONG TERM GOAL #1   Title Improve FOTO to 59 in 13 visits or less.    Status On-going      PT LONG TERM GOAL #2   Title Improve R elbow pain to consistently 0-2/10 on the Numeric Pain Rating Scale.    Status On-going      PT LONG TERM GOAL #3   Title Improve R grip strength to 45 pounds or greater.    Status On-going      PT LONG TERM GOAL #4   Title Marilyn Dixon will be independent with her long-term HEP at [place].    Status On-going                   Plan - 09/09/21 0944     Clinical Impression Statement Marilyn Dixon needed a lot of feedback and correction with her day 1 HEP.  She was using her R wrist too much with drop eccentrics possibly contributing to overuse and irritation.  She was also getting lots of movement with her wrist extensor isometrics exercise.  Both were corrected with lots of feedback and correction required.  Elbow AROM is objectively better and I expect better symptoms resolution with correct HEP technique and continued compliance.    Examination-Activity Limitations Reach Overhead;Self Feeding;Dressing;Hygiene/Grooming;Toileting;Sleep;Lift;Carry    Examination-Participation Restrictions Interpersonal Relationship;Cleaning;Community Activity;Driving;Meal Prep    Stability/Clinical Decision Making Stable/Uncomplicated    Rehab Potential Good    PT Frequency 2x / week    PT Duration 8  weeks   Likely closer to 4-6 weeks  PT Treatment/Interventions ADLs/Self Care Home Management;Electrical Stimulation;Cryotherapy;Iontophoresis 4mg /ml Dexamethasone;Therapeutic activities;Neuromuscular re-education;Therapeutic exercise;Patient/family education;Manual techniques;Dry needling    PT Next Visit Plan Assess response to DN.  Review HEP from last visit for technique.  Progress wrist extensors, elbow and general UE strength while avoiding overuse.    PT Home Exercise Plan Access Code: [email or web address]    Consulted and Agree with Plan of Care Patient             Patient will benefit from skilled therapeutic intervention in order to improve the following deficits and impairments:  Decreased activity tolerance, Decreased endurance, Decreased range of motion, Decreased strength, Increased edema, Impaired perceived functional ability, Impaired UE functional use, Pain  Visit Diagnosis: Stiffness of right elbow, not elsewhere classified  Stiffness of right wrist, not elsewhere classified  Pain in right elbow  Muscle weakness (generalized)  Localized edema     Problem List Patient Active Problem List   Diagnosis Date Noted   Hot flashes 05/10/2021   Status post cataract extraction 12/11/2018   Gastroesophageal reflux disease without esophagitis 12/07/2017   History of colonic polyps 02/19/2016   Seasonal affective disorder ([place]) 08/17/2015   Glaucoma 07/26/2011   Allergic rhinitis due to pollen 07/26/2011   Hyperlipidemia LDL goal <100 07/26/2011    Farley Ly, PT, MPT 09/09/2021, 12:26 PM  DN and manual therapy performed by Elsie Dixon, PT, DPT 09/09/21 12:30 PM   [place] [address] [place], [place], [address] Phone: [phone]   Fax:  [phone]  Name: Marilyn Dixon MRN: [id number] Date of Birth: 1950/10/27

## 2021-09-17 ENCOUNTER — Encounter: Payer: Self-pay | Admitting: Rehabilitative and Restorative Service Providers"

## 2021-09-17 ENCOUNTER — Ambulatory Visit (INDEPENDENT_AMBULATORY_CARE_PROVIDER_SITE_OTHER): Payer: Medicare HMO | Admitting: Rehabilitative and Restorative Service Providers"

## 2021-09-17 ENCOUNTER — Other Ambulatory Visit: Payer: Self-pay

## 2021-09-17 DIAGNOSIS — R600 Localized edema: Secondary | ICD-10-CM | POA: Diagnosis not present

## 2021-09-17 DIAGNOSIS — M25521 Pain in right elbow: Secondary | ICD-10-CM | POA: Diagnosis not present

## 2021-09-17 DIAGNOSIS — M25631 Stiffness of right wrist, not elsewhere classified: Secondary | ICD-10-CM | POA: Diagnosis not present

## 2021-09-17 DIAGNOSIS — M6281 Muscle weakness (generalized): Secondary | ICD-10-CM

## 2021-09-17 DIAGNOSIS — M25621 Stiffness of right elbow, not elsewhere classified: Secondary | ICD-10-CM | POA: Diagnosis not present

## 2021-09-17 NOTE — Patient Instructions (Signed)
Continue with current HEP, no additions other than 3# weight.

## 2021-09-17 NOTE — Therapy (Signed)
[facility] [address] [place], [place], [address] Phone: [phone]   Fax:  [phone]  Physical Therapy Treatment/Progress Note  Patient Details  Name: Marilyn Dixon MRN: [id number] Date of Birth: 1951/04/16 Referring Provider (PT): Denita Lung MD  Progress Note Reporting Period 08/26/2021 to 09/17/2021  See note below for Objective Data and Assessment of Progress/Goals.     Encounter Date: 09/17/2021   PT End of Session - 09/17/21 1222     Visit Number 4    Number of Visits 12    Date for PT Re-Evaluation 10/21/21    Progress Note Due on Visit 10    PT Start Time 1140    PT Stop Time 1223    PT Time Calculation (min) 43 min    Activity Tolerance Patient tolerated treatment well;No increased pain    Behavior During Therapy WFL for tasks assessed/performed             Past Medical History:  Diagnosis Date   Allergy    Back pain    Dyslipidemia    Glaucoma    High cholesterol    Hx of adenomatous colonic polyps    Menopause    Seasonal affective disorder [facility])     Past Surgical History:  Procedure Laterality Date   COLONOSCOPY  2006/2011   Schooler,Santogade    There were no vitals filed for this visit.   Subjective Assessment - 09/17/21 1146     Subjective Kynzlie notes her elbow did better than expected while out of town.  Sleep is "OK" and better than it was.    Pertinent History NA    Limitations Lifting;House hold activities;Writing    Patient Stated Goals Be able to use her R arm without pain or restriction.    Currently in Pain? Yes    Pain Score 4     Pain Location Elbow    Pain Orientation Right    Pain Descriptors / Indicators Aching;Tender;Burning    Pain Type Acute pain    Pain Radiating Towards R lateral epicondyle and extensor mechanism    Pain Onset More than a month ago    Pain Frequency Constant    Aggravating Factors  R UE use and sleeping (better but not perfect)    Pain  Relieving Factors Better with exercises    Effect of Pain on Daily Activities Affects all R UE use and sleep    Multiple Pain Sites No                [facility] PT Assessment - 09/17/21 0001       AROM   AROM Assessment Site Wrist;Elbow    Right/Left Elbow Right    Right Elbow Flexion 145    Right Elbow Extension -30    Right/Left Wrist Right    Right Wrist Extension 65 Degrees    Right Wrist Flexion 70 Degrees      Strength   Overall Strength Comments Grip strength R in pounds: 46.7 pounds                           [facility] Adult PT Treatment/Exercise - 09/17/21 0001       Exercises   Exercises Wrist;Elbow      Elbow Exercises   Elbow Flexion AAROM;Right;20 reps;Standing    Elbow Extension AAROM;Right;20 reps;Standing      Wrist Exercises   Wrist Extension Strengthening;Right;Other reps (comment);Seated;Limitations    Bar Weights/Barbell (Wrist  Extension) 2 lbs;3 lbs    Wrist Extension Limitations Drop eccentrics 2 sets of 30    Other wrist exercises Wrist extension Isometrics 2 sets of 10 for 5 seconds    Other wrist exercises Pull to chest and shoulder ER with theraband (Blue PTC and red ER) 10X each wrist in neutral                     PT Education - 09/17/21 1218     Education Details Reviewed HEP which did require corrective feedback.  Discussed the importance of still avoiding overuse and technique with drop eccentrics with a 3# weight.    Person(s) Educated Patient    Methods Explanation;Demonstration;Tactile cues;Verbal cues    Comprehension Verbalized understanding;Tactile cues required;Need further instruction;Returned demonstration;Verbal cues required              PT Short Term Goals - 09/17/21 1219       PT SHORT TERM GOAL #1   Title Improve R wrist and elbow AROM to 98% or greater as compared to the uninvolved L.    Baseline Met other than elbow extension AROM.    Time 4    Period Weeks    Status Partially Met     Target Date 09/23/21      PT SHORT TERM GOAL #2   Title Improve R grip strength to > 30 pounds.    Baseline 46.7 pounds < 20 pounds    Time 4    Period Weeks    Status Achieved    Target Date 09/23/21               PT Long Term Goals - 09/17/21 1220       PT LONG TERM GOAL #1   Title Improve FOTO to 59 in 13 visits or less.    Baseline 48 (was 27)    Time 8    Period Weeks    Status On-going    Target Date 10/21/21      PT LONG TERM GOAL #2   Title Improve R elbow pain to consistently 0-2/10 on the Numeric Pain Rating Scale.    Baseline 4/10 (was 7/10)    Time 8    Period Weeks    Status On-going    Target Date 10/21/21      PT LONG TERM GOAL #3   Title Improve R grip strength to 45 pounds or greater.    Baseline Met    Time 8    Period Weeks    Status Achieved    Target Date 10/21/21      PT LONG TERM GOAL #4   Title Stefana will be independent with her long-term HEP at [place].    Baseline Reviewed today    Time 8    Period Weeks    Status On-going    Target Date 10/21/21                   Plan - 09/17/21 1223     Clinical Impression Statement Collyns is doing much better than she was 3 weeks ago at evaluation.  Pain is roughly half and strength has more than doubled.  Elbow extension AROM, R UE strength and avoiding overuse will still be important to meet all LTGs.  She is on track to meet LTGs in the recommended time.    Examination-Activity Limitations Reach Overhead;Self Feeding;Dressing;Hygiene/Grooming;Toileting;Sleep;Lift;Carry    Examination-Participation Restrictions Interpersonal Relationship;Cleaning;Community Activity;Driving;Meal Prep    Stability/Clinical Decision  Making Stable/Uncomplicated    Rehab Potential Good    PT Frequency 2x / week    PT Duration 8 weeks   Likely closer to 4-6 weeks   PT Treatment/Interventions ADLs/Self Care Home Management;Electrical Stimulation;Cryotherapy;Iontophoresis 91m/ml Dexamethasone;Therapeutic  activities;Neuromuscular re-education;Therapeutic exercise;Patient/family education;Manual techniques;Dry needling    PT Next Visit Plan Progress grip strength while avoiding overuse.    PT Home Exercise Plan Access Code: [email or web address]   Consulted and Agree with Plan of Care Patient             Patient will benefit from skilled therapeutic intervention in order to improve the following deficits and impairments:  Decreased activity tolerance, Decreased endurance, Decreased range of motion, Decreased strength, Increased edema, Impaired perceived functional ability, Impaired UE functional use, Pain  Visit Diagnosis: Stiffness of right elbow, not elsewhere classified  Stiffness of right wrist, not elsewhere classified  Pain in right elbow  Muscle weakness (generalized)  Localized edema     Problem List Patient Active Problem List   Diagnosis Date Noted   Hot flashes 05/10/2021   Status post cataract extraction 12/11/2018   Gastroesophageal reflux disease without esophagitis 12/07/2017   History of colonic polyps 02/19/2016   Seasonal affective disorder ([place] 08/17/2015   Glaucoma 07/26/2011   Allergic rhinitis due to pollen 07/26/2011   Hyperlipidemia LDL goal <100 07/26/2011    RFarley Ly PT, MPT 09/17/2021, 12:30 PM  [facility] [address] [place] [address]Phone: [phone]  Fax:  [phone] Name: LAri EngelbrechtMRN: [id number] of Birth: 705/20/1952

## 2021-09-20 ENCOUNTER — Encounter: Payer: Self-pay | Admitting: Family Medicine

## 2021-09-21 ENCOUNTER — Other Ambulatory Visit: Payer: Self-pay

## 2021-09-21 ENCOUNTER — Ambulatory Visit
Admission: RE | Admit: 2021-09-21 | Discharge: 2021-09-21 | Disposition: A | Discharge status: Home / Self Care | Payer: Medicare HMO | Source: Ambulatory Visit | Attending: Family Medicine | Admitting: Family Medicine

## 2021-09-21 DIAGNOSIS — M8580 Other specified disorders of bone density and structure, unspecified site: Secondary | ICD-10-CM | POA: Insufficient documentation

## 2021-09-21 DIAGNOSIS — M85852 Other specified disorders of bone density and structure, left thigh: Secondary | ICD-10-CM | POA: Diagnosis not present

## 2021-09-21 DIAGNOSIS — Z780 Asymptomatic menopausal state: Secondary | ICD-10-CM | POA: Diagnosis not present

## 2021-09-22 ENCOUNTER — Encounter: Payer: Self-pay | Admitting: Rehabilitative and Restorative Service Providers"

## 2021-09-22 ENCOUNTER — Ambulatory Visit: Payer: Medicare HMO | Admitting: Rehabilitative and Restorative Service Providers"

## 2021-09-22 ENCOUNTER — Other Ambulatory Visit: Payer: Self-pay

## 2021-09-22 DIAGNOSIS — M25631 Stiffness of right wrist, not elsewhere classified: Secondary | ICD-10-CM | POA: Diagnosis not present

## 2021-09-22 DIAGNOSIS — M6281 Muscle weakness (generalized): Secondary | ICD-10-CM

## 2021-09-22 DIAGNOSIS — R600 Localized edema: Secondary | ICD-10-CM

## 2021-09-22 DIAGNOSIS — M25521 Pain in right elbow: Secondary | ICD-10-CM | POA: Diagnosis not present

## 2021-09-22 DIAGNOSIS — M25621 Stiffness of right elbow, not elsewhere classified: Secondary | ICD-10-CM

## 2021-09-22 NOTE — Patient Instructions (Signed)
Access Code: [email or web address] URL: [email or web address] Date: 09/22/2021 Prepared by: Vista Mink  Exercises Isometric Wrist Extension Pronated - 5 x daily - 7 x weekly - 1 sets - 10 reps - 5 seconds hold Wrist Flexion Extension AROM with Fingers Curled and Palm Down - 5 x daily - 7 x weekly - 1 sets - 20-30 reps Elbow AROM Blocked Extension in Neutral Position - 3-5 x daily - 7 x weekly - 1 sets - 10 reps - 5 seconds hold

## 2021-09-22 NOTE — Therapy (Signed)
[place], [place], [address] Phone: [phone]   Fax:  [phone]  Physical Therapy Treatment  Patient Details  Name: Marilyn Dixon MRN: [id number] Date of Birth: 03-19-1951 Referring Provider (PT): Denita Lung MD   Encounter Date: 09/22/2021   PT End of Session - 09/22/21 1205     Visit Number 5    Number of Visits 12    Date for PT Re-Evaluation 10/21/21    Progress Note Due on Visit 10    PT Start Time 8756    PT Stop Time 1100    PT Time Calculation (min) 45 min    Activity Tolerance Patient tolerated treatment well;No increased pain    Behavior During Therapy WFL for tasks assessed/performed             Past Medical History:  Diagnosis Date   Allergy    Back pain    Dyslipidemia    Glaucoma    High cholesterol    Hx of adenomatous colonic polyps    Menopause    Seasonal affective disorder [facility])     Past Surgical History:  Procedure Laterality Date   COLONOSCOPY  2006/2011   Schooler,Santogade    There were no vitals filed for this visit.   Subjective Assessment - 09/22/21 1018     Subjective [name] notes poor HEP compliance while sick with a virus.  She notes she continues to feel better and is taking Aleve PRN.    Pertinent History NA    Limitations Lifting;House hold activities;Writing    Patient Stated Goals Be able to use her R arm without pain or restriction.    Currently in Pain? Yes    Pain Score 4     Pain Location Elbow    Pain Orientation Right    Pain Descriptors / Indicators Tender;Aching;Tightness    Pain Type Acute pain    Pain Radiating Towards R lateral epicondyle and extensor mechanism    Pain Onset More than a month ago    Pain Frequency Intermittent    Aggravating Factors  R UE use and sleeping (continues to improve)    Pain Relieving Factors Better with exercises    Effect of Pain on Daily Activities Affects all R UE use and sleep    Multiple Pain Sites  No                [facility] PT Assessment - 09/22/21 0001       AROM   AROM Assessment Site Elbow    Right/Left Elbow Right    Right Elbow Flexion 142    Right Elbow Extension -30                           [facility] Adult PT Treatment/Exercise - 09/22/21 0001       Exercises   Exercises Wrist;Elbow      Elbow Exercises   Elbow Flexion AAROM;Right;20 reps;Standing    Elbow Extension AAROM;Right;20 reps;Standing    Other elbow exercises UBE Push/Pull/Rest :20 intervals 5 minutes      Wrist Exercises   Wrist Extension Strengthening;Right;Other reps (comment);Seated;Limitations    Bar Weights/Barbell (Wrist Extension) 3 lbs    Wrist Extension Limitations Drop eccentrics 2 sets of 30    Other wrist exercises Wrist extension Isometrics 2 sets of 10 for 5 seconds    Other wrist exercises Pull to chest and shoulder ER with theraband (Blue PTC and red  ER) 20X PTC and ER 10X with wrist in neutral                     PT Education - 09/22/21 1205     Education Details Reviewed HEP with addition of UBE for strengthening.  Needed less feedback but still requires supervision and correction.    Person(s) Educated Patient    Methods Explanation;Verbal cues;Tactile cues    Comprehension Verbalized understanding;Tactile cues required;Returned demonstration;Need further instruction;Verbal cues required              PT Short Term Goals - 09/17/21 1219       PT SHORT TERM GOAL #1   Title Improve R wrist and elbow AROM to 98% or greater as compared to the uninvolved L.    Baseline Met other than elbow extension AROM.    Time 4    Period Weeks    Status Partially Met    Target Date 09/23/21      PT SHORT TERM GOAL #2   Title Improve R grip strength to > 30 pounds.    Baseline 46.7 pounds < 20 pounds    Time 4    Period Weeks    Status Achieved    Target Date 09/23/21               PT Long Term Goals - 09/17/21 1220       PT LONG TERM GOAL  #1   Title Improve FOTO to 59 in 13 visits or less.    Baseline 48 (was 27)    Time 8    Period Weeks    Status On-going    Target Date 10/21/21      PT LONG TERM GOAL #2   Title Improve R elbow pain to consistently 0-2/10 on the Numeric Pain Rating Scale.    Baseline 4/10 (was 7/10)    Time 8    Period Weeks    Status On-going    Target Date 10/21/21      PT LONG TERM GOAL #3   Title Improve R grip strength to 45 pounds or greater.    Baseline Met    Time 8    Period Weeks    Status Achieved    Target Date 10/21/21      PT LONG TERM GOAL #4   Title [name] will be independent with her long-term HEP at [place].    Baseline Reviewed today    Time 8    Period Weeks    Status On-going    Target Date 10/21/21                   Plan - 09/22/21 1206     Clinical Impression Statement Secondary to an illness, HEP compliance has suffered.  Silver lining is she has also been avoiding overuse while sick.  Reviewed HEP today with UBE progression (seat set back to emphasize elbow extension AROM).  Continue POC 1X/week to address remaining AROM, strength and functional impairments.    Examination-Activity Limitations Reach Overhead;Self Feeding;Dressing;Hygiene/Grooming;Toileting;Sleep;Lift;Carry    Examination-Participation Restrictions Interpersonal Relationship;Cleaning;Community Activity;Driving;Meal Prep    Stability/Clinical Decision Making Stable/Uncomplicated    Rehab Potential Good    PT Frequency 1x / week    PT Duration 4 weeks   Likely closer to 4-6 weeks   PT Treatment/Interventions ADLs/Self Care Home Management;Electrical Stimulation;Cryotherapy;Iontophoresis 60m/ml Dexamethasone;Therapeutic activities;Neuromuscular re-education;Therapeutic exercise;Patient/family education;Manual techniques;Dry needling    PT Next Visit Plan Progress grip strength while avoiding  overuse.    PT Home Exercise Plan Access Code: [email or web address]    Consulted and Agree with Plan of Care Patient              Patient will benefit from skilled therapeutic intervention in order to improve the following deficits and impairments:  Decreased activity tolerance, Decreased endurance, Decreased range of motion, Decreased strength, Increased edema, Impaired perceived functional ability, Impaired UE functional use, Pain  Visit Diagnosis: Stiffness of right elbow, not elsewhere classified  Stiffness of right wrist, not elsewhere classified  Pain in right elbow  Muscle weakness (generalized)  Localized edema     Problem List Patient Active Problem List   Diagnosis Date Noted   Osteopenia 09/21/2021   Hot flashes 05/10/2021   Status post cataract extraction 12/11/2018   Gastroesophageal reflux disease without esophagitis 12/07/2017   History of colonic polyps 02/19/2016   Seasonal affective disorder ([place]) 08/17/2015   Glaucoma 07/26/2011   Allergic rhinitis due to pollen 07/26/2011   Hyperlipidemia LDL goal <100 07/26/2011    Farley Ly, PT, MPT 09/22/2021, 12:08 PM  [facility] Physical Therapy [address] [place], [place], [address] Phone: [phone]   Fax:  [phone]  Name: Marilyn Dixon MRN: [id number] Date of Birth: [birth year]

## 2021-09-28 ENCOUNTER — Encounter: Payer: Self-pay | Admitting: Physical Therapy

## 2021-09-28 ENCOUNTER — Other Ambulatory Visit: Payer: Self-pay

## 2021-09-28 ENCOUNTER — Ambulatory Visit: Payer: Medicare HMO | Admitting: Physical Therapy

## 2021-09-28 DIAGNOSIS — M25521 Pain in right elbow: Secondary | ICD-10-CM

## 2021-09-28 DIAGNOSIS — M6281 Muscle weakness (generalized): Secondary | ICD-10-CM

## 2021-09-28 DIAGNOSIS — R600 Localized edema: Secondary | ICD-10-CM | POA: Diagnosis not present

## 2021-09-28 DIAGNOSIS — M25631 Stiffness of right wrist, not elsewhere classified: Secondary | ICD-10-CM

## 2021-09-28 DIAGNOSIS — M25621 Stiffness of right elbow, not elsewhere classified: Secondary | ICD-10-CM | POA: Diagnosis not present

## 2021-09-28 NOTE — Therapy (Signed)
[place], [place], [address] Phone: [phone]   Fax:  [phone]  Physical Therapy Treatment  Patient Details  Name: Marilyn Dixon MRN: [id number] Date of Birth: [birth year] Referring Provider (PT): Denita Lung MD   Encounter Date: 09/28/2021   PT End of Session - 09/28/21 0848     Visit Number 6    Number of Visits 12    Date for PT Re-Evaluation 10/21/21    Progress Note Due on Visit 10    PT Start Time 0800    PT Stop Time 0845    PT Time Calculation (min) 45 min             Past Medical History:  Diagnosis Date   Allergy    Back pain    Dyslipidemia    Glaucoma    High cholesterol    Hx of adenomatous colonic polyps    Menopause    Seasonal affective disorder [facility])     Past Surgical History:  Procedure Laterality Date   COLONOSCOPY  2006/2011   Schooler,Santogade    There were no vitals filed for this visit.   Subjective Assessment - 09/28/21 0805     Subjective Pt arriving today reporting episodes of pain that can be sharp. Pt noting continued swelling in her lateral right elbow. Pt still taking Aleve as needed.    Pertinent History NA    Limitations Lifting;House hold activities;Writing    Patient Stated Goals Be able to use her R arm without pain or restriction.    Currently in Pain? Yes    Pain Score 4     Pain Location Elbow    Pain Orientation Right    Pain Descriptors / Indicators Aching    Pain Type Acute pain    Pain Onset More than a month ago                [facility] PT Assessment - 09/28/21 0001       AROM   AROM Assessment Site Elbow    Right/Left Elbow Right    Right Elbow Flexion 135    Right Elbow Extension -22                           [facility] Adult PT Treatment/Exercise - 09/28/21 0001       Exercises   Exercises Wrist;Elbow      Elbow Exercises   Elbow Extension AAROM;Right;20 reps;Standing    Other elbow exercises UBE  Push/Pull/Rest :20 intervals 5 minutes    Other elbow exercises pronation, supination self range      Wrist Exercises   Wrist Extension Strengthening;Right;Other reps (comment);Seated;Limitations    Bar Weights/Barbell (Wrist Extension) 3 lbs    Wrist Extension Limitations dropped off edge of mat table, slow eccentrics      Manual Therapy   Manual therapy comments skilled palpation to wrist flexor and extensors with STM over active trigger points              Trigger Point Dry Needling - 09/28/21 0001     Consent Given? Yes    Education Handout Provided Previously provided    Muscles Treated Wrist/Hand Flexor carpi radialis;Flexor carpi ulnaris;Extensor carpi radialis longus/brevis;Extensor digitorum;Extensor carpi ulnaris    Dry Needling Comments pt in supine position, greater twitch response to flexor muscles this visit.    Flexor carpi radialis Response Twitch response elicited    Flexor carpi ulnaris  Response Twitch response elicited    Extensor carpi radialis longus/brevis Response Twitch response elicited    Extensor digitorum Response Twitch response elicited    Extensor carpi ulnaris Response Twitch response elicited                     PT Short Term Goals - 09/17/21 1219       PT SHORT TERM GOAL #1   Title Improve R wrist and elbow AROM to 98% or greater as compared to the uninvolved L.    Baseline Met other than elbow extension AROM.    Time 4    Period Weeks    Status Partially Met    Target Date 09/23/21      PT SHORT TERM GOAL #2   Title Improve R grip strength to > 30 pounds.    Baseline 46.7 pounds < 20 pounds    Time 4    Period Weeks    Status Achieved    Target Date 09/23/21               PT Long Term Goals - 09/28/21 1248       PT LONG TERM GOAL #1   Title Improve FOTO to 59 in 13 visits or less.    Baseline 48 (was 27)    Status On-going      PT LONG TERM GOAL #2   Title Improve R elbow pain to consistently 0-2/10 on the  Numeric Pain Rating Scale.    Baseline 4/10 (was 7/10)    Status On-going      PT LONG TERM GOAL #3   Title Improve R grip strength to 45 pounds or greater.    Status Achieved      PT LONG TERM GOAL #4   Title Marilyn Dixon will be independent with her long-term HEP at [place].    Status On-going                   Plan - 09/28/21 1243     Clinical Impression Statement Pt arriving today reporting 4/10 pain in her right elbow and frustrated in progress with her motion and pain. ROM measurements were taken and pt has improved her rt elbow extension by 8 degrees. DN performed to both flexor and extensors with good twitch responses noted. Conitnue skilled PT to maximize pt's overall function.    Examination-Activity Limitations Reach Overhead;Self Feeding;Dressing;Hygiene/Grooming;Toileting;Sleep;Lift;Carry    Examination-Participation Restrictions Interpersonal Relationship;Cleaning;Community Activity;Driving;Meal Prep    Stability/Clinical Decision Making Stable/Uncomplicated    Rehab Potential Good    PT Frequency 1x / week    PT Duration 4 weeks    PT Treatment/Interventions ADLs/Self Care Home Management;Electrical Stimulation;Cryotherapy;Iontophoresis 72m/ml Dexamethasone;Therapeutic activities;Neuromuscular re-education;Therapeutic exercise;Patient/family education;Manual techniques;Dry needling    PT Next Visit Plan Progress grip strength while avoiding overuse, extension prolonged stretching    PT Home Exercise Plan Access Code: [email or web address]   Consulted and Agree with Plan of Care Patient             Patient will benefit from skilled therapeutic intervention in order to improve the following deficits and impairments:  Decreased activity tolerance, Decreased endurance, Decreased range of motion, Decreased strength, Increased edema, Impaired perceived functional ability, Impaired UE functional use, Pain  Visit Diagnosis: Stiffness of right elbow, not elsewhere classified  Stiffness  of right wrist, not elsewhere classified  Pain in right elbow  Muscle weakness (generalized)  Localized edema     Problem List Patient Active Problem List  Diagnosis Date Noted   Osteopenia 09/21/2021   Hot flashes 05/10/2021   Status post cataract extraction 12/11/2018   Gastroesophageal reflux disease without esophagitis 12/07/2017   History of colonic polyps 02/19/2016   Seasonal affective disorder ([place]) 08/17/2015   Glaucoma 07/26/2011   Allergic rhinitis due to pollen 07/26/2011   Hyperlipidemia LDL goal <100 07/26/2011    Oretha Caprice, PT, MPT 09/28/2021, 12:51 PM  [facility] Physical Therapy [address] [place], [place], [address] Phone: [phone]   Fax:  [phone]  Name: Marilyn Dixon MRN: [id number] Date of Birth: 14-Mar-1951

## 2021-10-04 ENCOUNTER — Ambulatory Visit: Payer: Medicare HMO | Admitting: Physical Therapy

## 2021-10-04 ENCOUNTER — Encounter: Payer: Self-pay | Admitting: Physical Therapy

## 2021-10-04 ENCOUNTER — Other Ambulatory Visit: Payer: Self-pay

## 2021-10-04 DIAGNOSIS — M25631 Stiffness of right wrist, not elsewhere classified: Secondary | ICD-10-CM

## 2021-10-04 DIAGNOSIS — M6281 Muscle weakness (generalized): Secondary | ICD-10-CM | POA: Diagnosis not present

## 2021-10-04 DIAGNOSIS — M25621 Stiffness of right elbow, not elsewhere classified: Secondary | ICD-10-CM | POA: Diagnosis not present

## 2021-10-04 DIAGNOSIS — R600 Localized edema: Secondary | ICD-10-CM

## 2021-10-04 DIAGNOSIS — M25521 Pain in right elbow: Secondary | ICD-10-CM

## 2021-10-04 NOTE — Therapy (Signed)
[facility] Physical Therapy [address] [place], [place], [address] Phone: [phone]   Fax:  [phone]  Physical Therapy Treatment  Patient Details  Name: Marilyn Dixon MRN: [id number] Date of Birth: April 26, 1951 Referring Provider (PT): Jill Alexanders MD   Encounter Date: 10/04/2021   PT End of Session - 10/04/21 1329     Visit Number 7    Number of Visits 12    Date for PT Re-Evaluation 10/21/21    Progress Note Due on Visit 10    PT Start Time 7116    PT Stop Time 1345    PT Time Calculation (min) 38 min    Activity Tolerance Patient tolerated treatment well;No increased pain    Behavior During Therapy WFL for tasks assessed/performed             Past Medical History:  Diagnosis Date   Allergy    Back pain    Dyslipidemia    Glaucoma    High cholesterol    Hx of adenomatous colonic polyps    Menopause    Seasonal affective disorder [facility])     Past Surgical History:  Procedure Laterality Date   COLONOSCOPY  2006/2011   Schooler,Santogade    There were no vitals filed for this visit.   Subjective Assessment - 10/04/21 1326     Subjective Pt arriving today reporting 3/10 pain in right elbow. Pt reporting she tried a topical Aleve with massage rollers and it seems to have helped.    Pertinent History NA    Limitations Lifting;House hold activities;Writing    Patient Stated Goals Be able to use her R arm without pain or restriction.    Currently in Pain? Yes    Pain Score 3     Pain Location Elbow    Pain Orientation Right    Pain Descriptors / Indicators Aching;Sore    Pain Type Acute pain    Pain Onset More than a month ago                [facility] PT Assessment - 10/04/21 0001       Assessment   Medical Diagnosis Rt lateral epicondylitis    Referring Provider (PT) Jill Alexanders MD      AROM   AROM Assessment Site Elbow    Right/Left Elbow Right    Right Elbow Flexion 142    Right Elbow Extension -18                            [facility] Adult PT Treatment/Exercise - 10/04/21 0001       Exercises   Exercises Wrist;Elbow      Elbow Exercises   Elbow Extension AAROM;Right;20 reps;Standing    Other elbow exercises UBE Push/Pull/Rest :20 intervals 5 minutes    Other elbow exercises Pronation/supination using hammer x 20      Wrist Exercises   Wrist Extension Strengthening;Right;Other reps (comment);Seated;Limitations    Bar Weights/Barbell (Wrist Extension) 3 lbs    Wrist Extension Limitations dropped off edge of table    Other wrist exercises Grip/finger strength using Green [employer]i-flex x 10      Manual Therapy   Manual therapy comments skilled palpation to wrist flexor and extensors with STM over active trigger points              Trigger Point Dry Needling - 10/04/21 0001     Consent Given? Yes    Education Handout Provided Previously  provided    Muscles Treated Upper Quadrant Triceps    Triceps Response Twitch response elicited    Extensor carpi radialis longus/brevis Response Twitch response elicited    Extensor digitorum Response Twitch response elicited    Extensor carpi ulnaris Response Twitch response elicited                   PT Education - 10/04/21 1328     Education Details Instructed pt in proper placement and wearing of her new elbow support sleve.    Person(s) Educated Patient    Methods Explanation;Demonstration    Comprehension Verbalized understanding;Returned demonstration              PT Short Term Goals - 09/17/21 1219       PT SHORT TERM GOAL #1   Title Improve R wrist and elbow AROM to 98% or greater as compared to the uninvolved L.    Baseline Met other than elbow extension AROM.    Time 4    Period Weeks    Status Partially Met    Target Date 09/23/21      PT SHORT TERM GOAL #2   Title Improve R grip strength to > 30 pounds.    Baseline 46.7 pounds < 20 pounds    Time 4    Period Weeks    Status Achieved    Target  Date 09/23/21               PT Long Term Goals - 09/28/21 1248       PT LONG TERM GOAL #1   Title Improve FOTO to 59 in 13 visits or less.    Baseline 48 (was 27)    Status On-going      PT LONG TERM GOAL #2   Title Improve R elbow pain to consistently 0-2/10 on the Numeric Pain Rating Scale.    Baseline 4/10 (was 7/10)    Status On-going      PT LONG TERM GOAL #3   Title Improve R grip strength to 45 pounds or greater.    Status Achieved      PT LONG TERM GOAL #4   Title Advika will be independent with her long-term HEP at [place].    Status On-going                   Plan - 10/04/21 1330     Clinical Impression Statement Pt arriving today reporting DN seemed to helped after her last visit with less pain reported. Pt reporting 3/10 pain today. Pt wearing her new elbow support sleeve. Pt was instructed in positioning of the sleeve for comfort and wearing instructions. Pt tolerating exercises well, Continue to progress to maximize funciton.    Examination-Activity Limitations Reach Overhead;Self Feeding;Dressing;Hygiene/Grooming;Toileting;Sleep;Lift;Carry    Examination-Participation Restrictions Interpersonal Relationship;Cleaning;Community Activity;Driving;Meal Prep    Rehab Potential Good    PT Frequency 1x / week    PT Treatment/Interventions ADLs/Self Care Home Management;Electrical Stimulation;Cryotherapy;Iontophoresis 62m/ml Dexamethasone;Therapeutic activities;Neuromuscular re-education;Therapeutic exercise;Patient/family education;Manual techniques;Dry needling    PT Next Visit Plan Progress grip strength while avoiding overuse, progress  extension (prolonged stretching)    PT Home Exercise Plan Access Code: [email or web address]   Consulted and Agree with Plan of Care Patient             Patient will benefit from skilled therapeutic intervention in order to improve the following deficits and impairments:  Decreased activity tolerance, Decreased endurance, Decreased  range of motion, Decreased strength, Increased  edema, Impaired perceived functional ability, Impaired UE functional use, Pain  Visit Diagnosis: Stiffness of right elbow, not elsewhere classified  Stiffness of right wrist, not elsewhere classified  Pain in right elbow  Muscle weakness (generalized)  Localized edema     Problem List Patient Active Problem List   Diagnosis Date Noted   Osteopenia 09/21/2021   Hot flashes 05/10/2021   Status post cataract extraction 12/11/2018   Gastroesophageal reflux disease without esophagitis 12/07/2017   History of colonic polyps 02/19/2016   Seasonal affective disorder ([place]) 08/17/2015   Glaucoma 07/26/2011   Allergic rhinitis due to pollen 07/26/2011   Hyperlipidemia LDL goal <100 07/26/2011    Oretha Caprice, PT, MPT 10/04/2021, 2:02 PM  [facility] Physical Therapy [address] [place], [place], [address] Phone: [phone]   Fax:  [phone]  Name: Cristol Engdahl MRN: [id number] Date of Birth: 06/15/1951

## 2021-10-11 ENCOUNTER — Encounter: Payer: Medicare HMO | Admitting: Physical Therapy

## 2021-10-13 ENCOUNTER — Other Ambulatory Visit: Payer: Self-pay

## 2021-10-13 ENCOUNTER — Encounter: Payer: Self-pay | Admitting: Physical Therapy

## 2021-10-13 ENCOUNTER — Ambulatory Visit: Payer: Medicare HMO | Admitting: Physical Therapy

## 2021-10-13 DIAGNOSIS — M25521 Pain in right elbow: Secondary | ICD-10-CM | POA: Diagnosis not present

## 2021-10-13 DIAGNOSIS — M25621 Stiffness of right elbow, not elsewhere classified: Secondary | ICD-10-CM | POA: Diagnosis not present

## 2021-10-13 DIAGNOSIS — M6281 Muscle weakness (generalized): Secondary | ICD-10-CM | POA: Diagnosis not present

## 2021-10-13 DIAGNOSIS — M25631 Stiffness of right wrist, not elsewhere classified: Secondary | ICD-10-CM | POA: Diagnosis not present

## 2021-10-13 NOTE — Therapy (Signed)
[facility] Physical Therapy [address] [place], [place], [address] Phone: [phone]   Fax:  [phone]  Physical Therapy Treatment  Patient Details  Name: Marilyn Dixon MRN: [id number] Date of Birth: Oct 27, 1950 Referring Provider (PT): Jill Alexanders MD   Encounter Date: 10/13/2021   PT End of Session - 10/13/21 1601     Visit Number 8    Number of Visits 12    Date for PT Re-Evaluation 10/21/21    Progress Note Due on Visit 10    PT Start Time 8466    PT Stop Time 1646    PT Time Calculation (min) 40 min    Activity Tolerance Patient tolerated treatment well;No increased pain    Behavior During Therapy WFL for tasks assessed/performed             Past Medical History:  Diagnosis Date   Allergy    Back pain    Dyslipidemia    Glaucoma    High cholesterol    Hx of adenomatous colonic polyps    Menopause    Seasonal affective disorder [facility])     Past Surgical History:  Procedure Laterality Date   COLONOSCOPY  2006/2011   Schooler,Santogade    There were no vitals filed for this visit.   Subjective Assessment - 10/13/21 1611     Subjective States she continues to have the same 3/10 pain in her elbow    Pertinent History NA    Limitations Lifting;House hold activities;Writing    Patient Stated Goals Be able to use her R arm without pain or restriction.    Currently in Pain? Yes    Pain Score 3     Pain Location Elbow    Pain Orientation Right    Pain Descriptors / Indicators Aching;Sore    Pain Type Acute pain    Pain Onset More than a month ago                [facility] PT Assessment - 10/13/21 0001       Assessment   Medical Diagnosis Rt lateral epicondylitis    Referring Provider (PT) Jill Alexanders MD                           [facility] Adult PT Treatment/Exercise - 10/13/21 0001       Elbow Exercises   Other elbow exercises UBE Push/Pull/Rest 3 minutes fwd and 3 minutes bkwd    Other elbow  exercises standing distraction with 5# KB x5 1 minute each      Wrist Exercises   Other wrist exercises wrist extension stretch on wall x15 5" holds    Other wrist exercises shoulder extension stretch with dowel x15 5" holds      Manual Therapy   Manual Therapy Joint mobilization    Manual therapy comments performed independently of other interventions    Joint Mobilization distal PA/AP of radius and ulna with and without wrist ROM, medial glides to righ tlateral ribs grade II/III - tolerated moderately well                       PT Short Term Goals - 09/17/21 1219       PT SHORT TERM GOAL #1   Title Improve R wrist and elbow AROM to 98% or greater as compared to the uninvolved L.    Baseline Met other than elbow extension AROM.    Time 4  Period Weeks    Status Partially Met    Target Date 09/23/21      PT SHORT TERM GOAL #2   Title Improve R grip strength to > 30 pounds.    Baseline 46.7 pounds < 20 pounds    Time 4    Period Weeks    Status Achieved    Target Date 09/23/21               PT Long Term Goals - 09/28/21 1248       PT LONG TERM GOAL #1   Title Improve FOTO to 59 in 13 visits or less.    Baseline 48 (was 27)    Status On-going      PT LONG TERM GOAL #2   Title Improve R elbow pain to consistently 0-2/10 on the Numeric Pain Rating Scale.    Baseline 4/10 (was 7/10)    Status On-going      PT LONG TERM GOAL #3   Title Improve R grip strength to 45 pounds or greater.    Status Achieved      PT LONG TERM GOAL #4   Title Marilyn Dixon will be independent with her long-term HEP at [place].    Status On-going                   Plan - 10/13/21 1643     Clinical Impression Statement Trialed traction of elbow joint and this was tolerated well with reduced pulling and stretching noted with each repetition. Continued pain in elbow noted throughout session. Distal wrist mobilization allowed for improved pain free wrist ROM. Lateral rib  mobilization did not change elbow symptoms but improved mobility noted in ribs with mobilization. Same pain reported end of session. Will continue with current POC as tolerated.    Examination-Activity Limitations Reach Overhead;Self Feeding;Dressing;Hygiene/Grooming;Toileting;Sleep;Lift;Carry    Examination-Participation Restrictions Interpersonal Relationship;Cleaning;Community Activity;Driving;Meal Prep    Rehab Potential Good    PT Frequency 1x / week    PT Treatment/Interventions ADLs/Self Care Home Management;Electrical Stimulation;Cryotherapy;Iontophoresis 17m/ml Dexamethasone;Therapeutic activities;Neuromuscular re-education;Therapeutic exercise;Patient/family education;Manual techniques;Dry needling    PT Next Visit Plan Progress grip strength while avoiding overuse, progress  extension (prolonged stretching)    PT Home Exercise Plan Access Code: [email or web address]   Consulted and Agree with Plan of Care Patient             Patient will benefit from skilled therapeutic intervention in order to improve the following deficits and impairments:  Decreased activity tolerance, Decreased endurance, Decreased range of motion, Decreased strength, Increased edema, Impaired perceived functional ability, Impaired UE functional use, Pain  Visit Diagnosis: Stiffness of right elbow, not elsewhere classified  Stiffness of right wrist, not elsewhere classified  Pain in right elbow  Muscle weakness (generalized)     Problem List Patient Active Problem List   Diagnosis Date Noted   Osteopenia 09/21/2021   Hot flashes 05/10/2021   Status post cataract extraction 12/11/2018   Gastroesophageal reflux disease without esophagitis 12/07/2017   History of colonic polyps 02/19/2016   [place]al [place] 08/17/2015   Glaucoma 07/26/2011   Allergic rhinitis due to pollen 07/26/2011   Hyperlipidemia LDL goal <100 07/26/2011    4:46 PM, 10/13/21 MJerene Pitch DPT Physical Therapy with  [facility] [phone]   [facility] [address] [address]Phone: [phone]  Fax:  [phone] Name: Marilyn AdamiMRN: [id number] of Birth: [birth year]

## 2021-10-18 ENCOUNTER — Encounter: Payer: Self-pay | Admitting: Physical Therapy

## 2021-10-18 ENCOUNTER — Other Ambulatory Visit: Payer: Self-pay

## 2021-10-18 ENCOUNTER — Ambulatory Visit: Payer: Medicare HMO | Admitting: Physical Therapy

## 2021-10-18 DIAGNOSIS — M25521 Pain in right elbow: Secondary | ICD-10-CM | POA: Diagnosis not present

## 2021-10-18 DIAGNOSIS — M25621 Stiffness of right elbow, not elsewhere classified: Secondary | ICD-10-CM

## 2021-10-18 DIAGNOSIS — R600 Localized edema: Secondary | ICD-10-CM | POA: Diagnosis not present

## 2021-10-18 DIAGNOSIS — M25631 Stiffness of right wrist, not elsewhere classified: Secondary | ICD-10-CM | POA: Diagnosis not present

## 2021-10-18 DIAGNOSIS — M6281 Muscle weakness (generalized): Secondary | ICD-10-CM

## 2021-10-18 NOTE — Therapy (Addendum)
[facility] Physical Therapy [address] [place], [place], [address] Phone: [phone]   Fax:  [phone]  Physical Therapy Treatment/Progress note Progress Note reporting period date 08/26/22 to 10/18/21  See below for objective and subjective measurements relating to patients progress with PT.   Patient Details  Name: Tinleigh Whitmire MRN: [id number] Date of Birth: 1950-12-11 Referring Provider (PT): Jill Alexanders MD   Encounter Date: 10/18/2021   PT End of Session - 10/18/21 1156     Visit Number 9    Number of Visits 15    Date for PT Re-Evaluation 12/03/21    Authorization Type Re-cert sent on 0/06/9322 for 1x/ week for 6 more weeks    Progress Note Due on Visit 19    PT Start Time 1150    PT Stop Time 1230    PT Time Calculation (min) 40 min    Activity Tolerance Patient tolerated treatment well;No increased pain    Behavior During Therapy WFL for tasks assessed/performed             Past Medical History:  Diagnosis Date   Allergy    Back pain    Dyslipidemia    Glaucoma    High cholesterol    Hx of adenomatous colonic polyps    Menopause    Seasonal affective disorder [facility])     Past Surgical History:  Procedure Laterality Date   COLONOSCOPY  2006/2011   Schooler,Santogade    There were no vitals filed for this visit.   Subjective Assessment - 10/18/21 1300     Subjective States she continues to have pain in right elbow. Today's pain 5/10.    Limitations Lifting;House hold activities;Writing    Patient Stated Goals Be able to use her R arm without pain or restriction.    Currently in Pain? Yes    Pain Score 5     Pain Location Elbow    Pain Orientation Right    Pain Descriptors / Indicators Aching;Sore    Pain Type Acute pain    Pain Onset More than a month ago                [facility] PT Assessment - 10/18/21 0001       Assessment   Medical Diagnosis Rt lateral epicondylitis    Referring Provider (PT) Jill Alexanders MD      AROM   AROM Assessment Site Elbow    Right/Left Elbow Right    Right Elbow Flexion 138    Right Elbow Extension -15                           [facility] Adult PT Treatment/Exercise - 10/18/21 0001       Exercises   Exercises Wrist;Elbow      Elbow Exercises   Other elbow exercises UBE L3 Push/Pull/Rest 3 minutes fwd and 3 minutes bkwd      Wrist Exercises   Other wrist exercises wrist extension stretch on wall x15 5" holds    Other wrist exercises shoulder extension stretch with dowel x15 5" holds      Manual Therapy   Manual Therapy Joint mobilization    Joint Mobilization PA/AP mobs of right elbow                       PT Short Term Goals - 10/18/21 1205       PT SHORT TERM GOAL #1  Title Improve R wrist and elbow AROM to 98% or greater as compared to the uninvolved L.    Baseline Met other than elbow extension AROM.    Status Partially Met      PT SHORT TERM GOAL #2   Title Improve R grip strength to > 30 pounds.    Status Achieved               PT Long Term Goals - 10/18/21 1220       PT LONG TERM GOAL #1   Title Improve FOTO to 59 in 13 visits or less.    Baseline 58% on 10/18/2021 (48 on 09/17/2021) (was 27 at eval)    Status On-going      PT LONG TERM GOAL #2   Title Improve R elbow pain to consistently 0-2/10 on the Numeric Pain Rating Scale.    Baseline Still varies with pain reportd 5/10 today    Status On-going      PT LONG TERM GOAL #3   Title Improve R grip strength to 45 pounds or greater.    Status Achieved      PT LONG TERM GOAL #4   Title Sahmya will be independent with her long-term HEP at [place].    Status On-going                   Plan - 10/18/21 1229     Clinical Impression Statement Pt has made progress since beginning. Pt has improved her FOTO from 28% to 58%. Pt has improved her right grip strength to between 40-45 ppsi. Pt has progressed her right elbow flexion to 138  degrees. Pt is still reporting pain in her right elbow with daily tasks and lifting. Pt also with limitations in her right elbow extension of 15 degrees. Pt's HEP was updated beginning with more prolonged extension.  I am requesting 1x/ week for 6 additional weeks to progress pt toward her PLOF and LTG's.    Examination-Activity Limitations Reach Overhead;Self Feeding;Dressing;Hygiene/Grooming;Toileting;Sleep;Lift;Carry    Examination-Participation Restrictions Interpersonal Relationship;Cleaning;Community Activity;Driving;Meal Prep    Stability/Clinical Decision Making Stable/Uncomplicated    Rehab Potential Good    PT Frequency 1x / week    PT Duration 6 weeks    PT Treatment/Interventions ADLs/Self Care Home Management;Electrical Stimulation;Cryotherapy;Iontophoresis 62m/ml Dexamethasone;Therapeutic activities;Neuromuscular re-education;Therapeutic exercise;Patient/family education;Manual techniques;Dry needling    PT Next Visit Plan Progress grip strength while avoiding overuse, progress  extension (prolonged stretching), elbow mobs    PT Home Exercise Plan Access Code: [email or web address]   Consulted and Agree with Plan of Care Patient             Patient will benefit from skilled therapeutic intervention in order to improve the following deficits and impairments:  Decreased activity tolerance, Decreased endurance, Decreased range of motion, Decreased strength, Increased edema, Impaired perceived functional ability, Impaired UE functional use, Pain  Visit Diagnosis: Stiffness of right elbow, not elsewhere classified  Stiffness of right wrist, not elsewhere classified  Pain in right elbow  Muscle weakness (generalized)  Localized edema     Problem List Patient Active Problem List   Diagnosis Date Noted   Osteopenia 09/21/2021   Hot flashes 05/10/2021   Status post cataract extraction 12/11/2018   Gastroesophageal reflux disease without esophagitis 12/07/2017   History of colonic  polyps 02/19/2016   [place]al [place] 08/17/2015   Glaucoma 07/26/2011   Allergic rhinitis due to pollen 07/26/2011   Hyperlipidemia LDL goal <100 07/26/2011    JAnderson Malta  Cherylynn Ridges, PT, MPT 10/18/2021, 5:00 PM  [facility] Physical Therapy [address] [place], [place], [address] Phone: [phone]   Fax:  [phone]  Name: Addysen Louth MRN: [id number] Date of Birth: 03/31/1951

## 2021-10-19 ENCOUNTER — Other Ambulatory Visit: Payer: Self-pay | Admitting: Medical

## 2021-10-19 DIAGNOSIS — F419 Anxiety disorder, unspecified: Secondary | ICD-10-CM

## 2021-10-27 ENCOUNTER — Ambulatory Visit: Payer: Medicare HMO | Admitting: Physical Therapy

## 2021-10-27 ENCOUNTER — Encounter: Payer: Self-pay | Admitting: Physical Therapy

## 2021-10-27 DIAGNOSIS — M25631 Stiffness of right wrist, not elsewhere classified: Secondary | ICD-10-CM | POA: Diagnosis not present

## 2021-10-27 DIAGNOSIS — M6281 Muscle weakness (generalized): Secondary | ICD-10-CM

## 2021-10-27 DIAGNOSIS — M25621 Stiffness of right elbow, not elsewhere classified: Secondary | ICD-10-CM

## 2021-10-27 DIAGNOSIS — M25521 Pain in right elbow: Secondary | ICD-10-CM

## 2021-10-27 DIAGNOSIS — R600 Localized edema: Secondary | ICD-10-CM | POA: Diagnosis not present

## 2021-10-27 NOTE — Therapy (Signed)
[facility] Physical Therapy [address] [place], [place], [address] Phone: [phone]   Fax:  [phone]  Physical Therapy Treatment  Patient Details  Name: Marilyn Dixon MRN: [id number] Date of Birth: [birth year] Referring Provider (PT): Jill Alexanders MD   Encounter Date: 10/27/2021   PT End of Session - 10/27/21 1515     Visit Number 10    Number of Visits 15    Date for PT Re-Evaluation 12/03/21    Authorization Type Re-cert sent on 2/97/9892 for 1x/ week for 6 more weeks    Progress Note Due on Visit 19    PT Start Time 0230    PT Stop Time 0315    PT Time Calculation (min) 45 min    Activity Tolerance Patient tolerated treatment well;No increased pain    Behavior During Therapy WFL for tasks assessed/performed             Past Medical History:  Diagnosis Date   Allergy    Back pain    Dyslipidemia    Glaucoma    High cholesterol    Hx of adenomatous colonic polyps    Menopause    Seasonal affective disorder 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   Subjective Assessment - 10/27/21 1432     Subjective Pt. states that the pain in the R elbow is dull and achey today. She feels the elbow is able to extend better recently. She notes there is swelling around the elbow but less pain.    Limitations Lifting;House hold activities;Writing    Patient Stated Goals Be able to use her R arm without pain or restriction.    Pain Onset More than a month ago                               [facility] Adult PT Treatment/Exercise - 10/27/21 0001       Elbow Exercises   Other elbow exercises UBE L3 Push/Pull/Rest 3 minutes fwd and 3 minutes bkwd      Wrist Exercises   Wrist Flexion Strengthening;Right;Seated    Bar Weights/Barbell (Wrist Flexion) 3 lbs    Wrist Flexion Limitations dropped off edge of table 3 x 10     Wrist Extension Strengthening;Right;Other reps (comment);Seated;Limitations    Bar Weights/Barbell (Wrist Extension) 3 lbs    Wrist Extension Limitations dropped off edge of table 3 x 10    Other wrist exercises Supination and Pronation with 3# seated Rt. UE 3 x 10    Other wrist exercises wrist Extension with Red thera bar 2 UE 3 x 1 minute, Green digi- grip R hand only x 10 5 sec. hold.      Manual Therapy   Joint Mobilization PA/AP mobs of right elbow, cross friction massage of lateral epicondylitis                       PT Short Term Goals - 10/18/21 1205       PT SHORT TERM GOAL #1   Title Improve R wrist and elbow AROM to 98% or greater as compared to the uninvolved L.    Baseline Met other than elbow extension AROM.    Status Partially Met      PT SHORT TERM GOAL #2   Title Improve  R grip strength to > 30 pounds.    Status Achieved               PT Long Term Goals - 10/18/21 1220       PT LONG TERM GOAL #1   Title Improve FOTO to 59 in 13 visits or less.    Baseline 58% on 10/18/2021 (48 on 09/17/2021) (was 27 at eval)    Status On-going      PT LONG TERM GOAL #2   Title Improve R elbow pain to consistently 0-2/10 on the Numeric Pain Rating Scale.    Baseline Still varies with pain reportd 5/10 today    Status On-going      PT LONG TERM GOAL #3   Title Improve R grip strength to 45 pounds or greater.    Status Achieved      PT LONG TERM GOAL #4   Title Annjeanette will be independent with her long-term HEP at [facility].    Status On-going                   Plan - 10/27/21 1517     Clinical Impression Statement Pt. tolerated session well. Pt. is showing significant improvement in Rt. elbow extension ROM. However, she is still showing limitations and increased pain with Rt. wrist extension and supination with certain activities. Pt. is also progressing with R UE grip strength. We will continue to address ROM limitations in PT.     Examination-Activity Limitations Reach Overhead;Self Feeding;Dressing;Hygiene/Grooming;Toileting;Sleep;Lift;Carry    Examination-Participation Restrictions Interpersonal Relationship;Cleaning;Community Activity;Driving;Meal Prep    Stability/Clinical Decision Making Stable/Uncomplicated    Rehab Potential Good    PT Frequency 1x / week    PT Duration 6 weeks    PT Treatment/Interventions ADLs/Self Care Home Management;Electrical Stimulation;Cryotherapy;Iontophoresis 69m/ml Dexamethasone;Therapeutic activities;Neuromuscular re-education;Therapeutic exercise;Patient/family education;Manual techniques;Dry needling    PT Next Visit Plan Continue to progress wrist extension and supination exercises (Thera- bar)    PT Home Exercise Plan Access Code: [email or web address]   Consulted and Agree with Plan of Care Patient             Patient will benefit from skilled therapeutic intervention in order to improve the following deficits and impairments:  Decreased activity tolerance, Decreased endurance, Decreased range of motion, Decreased strength, Increased edema, Impaired perceived functional ability, Impaired UE functional use, Pain  Visit Diagnosis: Stiffness of right elbow, not elsewhere classified  Stiffness of right wrist, not elsewhere classified  Pain in right elbow  Muscle weakness (generalized)  Localized edema     Problem List Patient Active Problem List   Diagnosis Date Noted   Osteopenia 09/21/2021   Hot flashes 05/10/2021   Status post cataract extraction 12/11/2018   Gastroesophageal reflux disease without esophagitis 12/07/2017   History of colonic polyps 02/19/2016   Seasonal affective disorder ([place] 08/17/2015   Glaucoma 07/26/2011   Allergic rhinitis due to pollen 07/26/2011   Hyperlipidemia LDL goal <<628131/51/7616   AWilson Singer Student-PT 10/27/2021, 4:40 PM  [facility] [address] [address]Phone:  [phone]  Fax:  [phone] Name: Marilyn KieslerMRN: [id number] of Birth: [birth year]

## 2021-11-03 ENCOUNTER — Ambulatory Visit: Payer: Medicare HMO | Admitting: Physical Therapy

## 2021-11-03 ENCOUNTER — Encounter: Payer: Self-pay | Admitting: Physical Therapy

## 2021-11-03 ENCOUNTER — Other Ambulatory Visit: Payer: Self-pay

## 2021-11-03 DIAGNOSIS — M25621 Stiffness of right elbow, not elsewhere classified: Secondary | ICD-10-CM | POA: Diagnosis not present

## 2021-11-03 DIAGNOSIS — M25631 Stiffness of right wrist, not elsewhere classified: Secondary | ICD-10-CM | POA: Diagnosis not present

## 2021-11-03 DIAGNOSIS — R600 Localized edema: Secondary | ICD-10-CM | POA: Diagnosis not present

## 2021-11-03 DIAGNOSIS — M6281 Muscle weakness (generalized): Secondary | ICD-10-CM | POA: Diagnosis not present

## 2021-11-03 DIAGNOSIS — M25521 Pain in right elbow: Secondary | ICD-10-CM

## 2021-11-03 NOTE — Therapy (Signed)
[place], [place], [address] Phone: [phone]   Fax:  [phone]  Physical Therapy Treatment  Patient Details  Name: Marilyn Dixon MRN: [id number] Date of Birth: 09-Jul-1951 Referring Provider (PT): Marilyn Alexanders MD   Encounter Date: 11/03/2021   PT End of Session - 11/03/21 0946     Visit Number 11    Number of Visits 15    Date for PT Re-Evaluation 12/03/21    Authorization Type Re-cert sent on 1/74/9449 for 1x/ week for 6 more weeks    Progress Note Due on Visit 19    PT Start Time 0936    PT Stop Time 1015    PT Time Calculation (min) 39 min    Activity Tolerance Patient tolerated treatment well;No increased pain    Behavior During Therapy WFL for tasks assessed/performed             Past Medical History:  Diagnosis Date   Allergy    Back pain    Dyslipidemia    Glaucoma    High cholesterol    Hx of adenomatous colonic polyps    Menopause    Seasonal affective disorder [facility])     Past Surgical History:  Procedure Laterality Date   COLONOSCOPY  2006/2011   Schooler,[place]    There were no vitals filed for this visit.   Subjective Assessment - 11/03/21 0953     Subjective Pt arriving today reporting 3/10 pain in her rigth elbow. Pt stating the compression sleeve is helping and feels like her swelling is going down.    Pertinent History NA    Limitations Lifting;House hold activities;Writing    Patient Stated Goals Be able to use her R arm without pain or restriction.    Currently in Pain? Yes    Pain Score 3     Pain Location Elbow    Pain Orientation Right    Pain Descriptors / Indicators Aching;Sore    Pain Type Acute pain    Pain Onset More than a month ago                [facility] PT Assessment - 11/03/21 0001       Assessment   Medical Diagnosis Rt lateral epicondylitis    Referring Provider (PT) Marilyn Alexanders MD      AROM   AROM Assessment Site Elbow    Right/Left  Elbow Right    Right Elbow Flexion 138    Right Elbow Extension -12                           [facility] Adult PT Treatment/Exercise - 11/03/21 0001       Exercises   Exercises Wrist;Elbow      Elbow Exercises   Other elbow exercises UBE L3 Push/Pull/Rest 3 minutes fwd and 3 minutes bkwd      Wrist Exercises   Wrist Flexion Strengthening;Right;Seated    Bar Weights/Barbell (Wrist Flexion) 3 lbs    Wrist Flexion Limitations dropped off edge of table    Wrist Extension Strengthening;Right;Other reps (comment);Seated;Limitations    Bar Weights/Barbell (Wrist Extension) 3 lbs    Wrist Extension Limitations dropped off edge of table    Other wrist exercises Supination and Pronation with 3# seated Rt. UE 3 x 10    Other wrist exercises wrist Extension with Red thera bar 2 UE 3 x 1 minute, Green digi- grip R hand only x 10 5  sec. hold.      Manual Therapy   Joint Mobilization PA/AP mobs of right elbow, STM to extensor muscles and lateral epicondyle                       PT Short Term Goals - 11/03/21 1024       PT SHORT TERM GOAL #1   Title Improve R wrist and elbow AROM to 98% or greater as compared to the uninvolved L.    Baseline Met other than elbow extension AROM.    Status Partially Met      PT SHORT TERM GOAL #2   Title Improve R grip strength to > 30 pounds.    Status Achieved               PT Long Term Goals - 11/03/21 1025       PT LONG TERM GOAL #1   Title Improve FOTO to 59 in 13 visits or less.    Baseline 58% on 10/18/2021 (48 on 09/17/2021) (was 27 at eval)    Status On-going      PT LONG TERM GOAL #2   Title Improve R elbow pain to consistently 0-2/10 on the Numeric Pain Rating Scale.    Status On-going      PT LONG TERM GOAL #3   Title Improve R grip strength to 45 pounds or greater.    Status Achieved      PT LONG TERM GOAL #4   Title [name] will be independent with her long-term HEP at [facility].    Status On-going                    Plan - 11/03/21 1013     Clinical Impression Statement Pt making slow improvements in rt elbow extension to -12 degrees today. Pt reporting improvements in swelling due to wearing her elbow sleeve. Pt tolerating exercises well and manual therapy today. Continue skilled PT to maximize pt's funciton.    Examination-Activity Limitations Reach Overhead;Self Feeding;Dressing;Hygiene/Grooming;Toileting;Sleep;Lift;Carry    Examination-Participation Restrictions Interpersonal Relationship;Cleaning;Community Activity;Driving;Meal Prep    Stability/Clinical Decision Making Stable/Uncomplicated    Rehab Potential Good    PT Frequency 1x / week    PT Duration 6 weeks    PT Treatment/Interventions ADLs/Self Care Home Management;Electrical Stimulation;Cryotherapy;Iontophoresis 65m/ml Dexamethasone;Therapeutic activities;Neuromuscular re-education;Therapeutic exercise;Patient/family education;Manual techniques;Dry needling    PT Next Visit Plan Continue to progress wrist extension and supination exercises (Thera- bar)    PT Home Exercise Plan Access Code: [email or web address]   Consulted and Agree with Plan of Care Patient             Patient will benefit from skilled therapeutic intervention in order to improve the following deficits and impairments:  Decreased activity tolerance, Decreased endurance, Decreased range of motion, Decreased strength, Increased edema, Impaired perceived functional ability, Impaired UE functional use, Pain  Visit Diagnosis: Stiffness of right elbow, not elsewhere classified  Stiffness of right wrist, not elsewhere classified  Pain in right elbow  Muscle weakness (generalized)  Localized edema     Problem List Patient Active Problem List   Diagnosis Date Noted   Osteopenia 09/21/2021   Hot flashes 05/10/2021   Status post cataract extraction 12/11/2018   Gastroesophageal reflux disease without esophagitis 12/07/2017   History of colonic polyps  02/19/2016   Seasonal affective disorder ([place] 08/17/2015   Glaucoma 07/26/2011   Allergic rhinitis due to pollen 07/26/2011   Hyperlipidemia LDL goal <100 07/26/2011  Oretha Caprice, PT MPT 11/03/2021, 10:25 AM  [facility] Physical Therapy [address] [place], [place], [address] Phone: [phone]   Fax:  [phone]  Name: Marilyn Dixon MRN: [id number] Date of Birth: [birth year]

## 2021-11-08 ENCOUNTER — Ambulatory Visit (INDEPENDENT_AMBULATORY_CARE_PROVIDER_SITE_OTHER): Payer: Medicare HMO | Admitting: Medical

## 2021-11-08 ENCOUNTER — Other Ambulatory Visit: Payer: Self-pay

## 2021-11-08 VITALS — BP 120/70 | HR 65 | Temp 97.5°F | Wt 150.0 lb

## 2021-11-08 DIAGNOSIS — M25461 Effusion, right knee: Secondary | ICD-10-CM | POA: Diagnosis not present

## 2021-11-08 DIAGNOSIS — M25561 Pain in right knee: Secondary | ICD-10-CM

## 2021-11-08 NOTE — Progress Notes (Signed)
Subjective:  Marilyn Dixon is a 71 y.o. female who presents for Chief Complaint  Patient presents with   Joint Swelling    Knee pain and swelling since Saturday when she woken up. Not sure what is going on     2 days ago started getting pain and swelling of right knee.   No recent injury, no fall, no trauma.  No numbness or tingling.  Pain is sharp.   No prior chronic knee issues.  No hip or ankle pain.   Been using some   Has used some roller massage and topical biofreeze  Been seeing PT for ongoing right tennis elbow issues.  Otherwise no other swelling or joint pain.  No other aggravating or relieving factors.    No other c/o.  The following portions of the patient's history were reviewed and updated as appropriate: allergies, current medications, past family history, past medical history, past social history, past surgical history and problem list.  ROS Otherwise as in subjective above  Objective: BP 120/70    Pulse 65    Temp (!) 97.5 F (36.4 C)    Wt 150 lb (68 kg)    BMI 26.36 kg/m   General appearance: alert, no distress, well developed, well nourished MSK: mild to moderate general swelling of right knee.  Decrease ROM of knee.  Tender over patella and lateral joint line, otherwise knee nontender, no discomfort, erythema or bruising, no warmth, no laxity.  Right hip, ankle and rest of leg nontender, without enormity otherwise.     Pulses: 2+ radial pulses, 2+ pedal pulses, normal cap refill Ext: no edema Legs neurovascularly intact   Assessment: Encounter Diagnoses  Name Primary?   Acute pain of right knee Yes   Pain and swelling of right knee      Plan: Symptoms and exam suggest acute inflammation.  Discussed exam findings, treatment recommendations below.  If worse swelling or pain in the next 72 hours, recheck.  If any signs of infection such as fever, warmth, redness, recheck immediately.  Patient Instructions  Acute knee pain and swelling  Seems to  be more acute inflammation.  No signs of infection, no history of gout, no history of chronic knee or leg pains.  No recent trauma, injury, or fall.    Recommendations:  RICE - rest, ice , compression, elevation   Rest, stay off the leg for the next week when possible  Ice - use cool therapy such as ice water pack or bag of frozen peas, 20 minutes on, 20 minutes off  Compression - use knee sleeve or knee brace over the counter for compression and stability  Elevate the leg particular when using cool therapy the next few days  Use over the counter Aleve, 1 tablet twice daily for the next week.    If much improved within a week, the return gradually to regular activity  If not improving at all, or if worse this week, return.      Consider xray    Please go to [facility] for your right knee xray.   Their hours are 8am - 4:30 pm Monday - Friday.  Take your insurance card with you.  [facility] [phone]  [place] [address], [place], [place] [address]  [address],  [address]    Acute Knee Pain, Adult Many things can cause knee pain. Sometimes, knee pain is sudden (acute) and may be caused by damage, swelling, or irritation of the muscles  and tissues that support your knee. The pain often goes away on its own with time and rest. If the pain does not go away, tests may be done to find out what is causing the pain. Follow these instructions at home: If you have a knee sleeve or brace:  Wear the knee sleeve or brace as told by your doctor. Take it off only as told by your doctor. Loosen it if your toes: Tingle. Become numb. Turn cold and blue. Keep it clean. If the knee sleeve or brace is not waterproof: Do not let it get wet. Cover it with a watertight covering when you take a bath or shower. Activity Rest your knee. Do not do things that cause pain or make pain worse. Avoid activities where both feet leave the ground at the  same time (high-impact activities). Examples are running, jumping rope, and doing jumping jacks. Work with a physical therapist to make a safe exercise program, as told by your doctor. Managing pain, stiffness, and swelling  If told, put ice on the knee. To do this: If you have a removable knee sleeve or brace, take it off as told by your doctor. Put ice in a plastic bag. Place a towel between your skin and the bag. Leave the ice on for 20 minutes, 2-3 times a day. Take off the ice if your skin turns bright red. This is very important. If you cannot feel pain, heat, or cold, you have a greater risk of damage to the area. If told, use an elastic bandage to put pressure (compression) on your injured knee. Raise your knee above the level of your heart while you are sitting or lying down. Sleep with a pillow under your knee. General instructions Take over-the-counter and prescription medicines only as told by your doctor. Do not smoke or use any products that contain nicotine or tobacco. If you need help quitting, ask your doctor. If you are overweight, work with your doctor and a food expert (dietitian) to set goals to lose weight. Being overweight can make your knee hurt more. Watch for any changes in your symptoms. Keep all follow-up visits. Contact a doctor if: The knee pain does not stop. The knee pain changes or gets worse. You have a fever along with knee pain. Your knee is red or feels warm when you touch it. Your knee gives out or locks up. Get help right away if: Your knee swells, and the swelling gets worse. You cannot move your knee. You have very bad knee pain that does not get better with pain medicine. Summary Many things can cause knee pain. The pain often goes away on its own with time and rest. Your doctor may do tests to find out the cause of the pain. Watch for any changes in your symptoms. Relieve your pain with rest, medicines, light activity, and use of ice. Get  help right away if you cannot move your knee or your knee pain is very bad. This information is not intended to replace advice given to you by your health care provider. Make sure you discuss any questions you have with your health care provider. Document Revised: 02/19/2020 Document Reviewed: 02/19/2020 Elsevier Patient Education  2022 [place] was seen today for joint swelling.  Diagnoses and all orders for this visit:  Acute pain of right knee -     DG Knee Complete 4 Views Right; Future  Pain and swelling of right knee -  DG Knee Complete 4 Views Right; Future    Follow up: pending xray

## 2021-11-08 NOTE — Patient Instructions (Addendum)
Acute knee pain and swelling  Seems to be more acute inflammation.  No signs of infection, no history of gout, no history of chronic knee or leg pains.  No recent trauma, injury, or fall.    Recommendations:  RICE - rest, ice , compression, elevation   Rest, stay off the leg for the next week when possible  Ice - use cool therapy such as ice water pack or bag of frozen peas, 20 minutes on, 20 minutes off  Compression - use knee sleeve or knee brace over the counter for compression and stability  Elevate the leg particular when using cool therapy the next few days  Use over the counter Aleve, 1 tablet twice daily for the next week.    If much improved within a week, the return gradually to regular activity  If not improving at all, or if worse this week, return.      Consider xray    Please go to [facility] for your right knee xray.   Their hours are 8am - 4:30 pm Monday - Friday.  Take your insurance card with you.  [facility] [phone]  [place] [address], [place], [place] [address]  [address], [place] [address]    Acute Knee Pain, Adult Many things can cause knee pain. Sometimes, knee pain is sudden (acute) and may be caused by damage, swelling, or irritation of the muscles and tissues that support your knee. The pain often goes away on its own with time and rest. If the pain does not go away, tests may be done to find out what is causing the pain. Follow these instructions at home: If you have a knee sleeve or brace:  Wear the knee sleeve or brace as told by your doctor. Take it off only as told by your doctor. Loosen it if your toes: Tingle. Become numb. Turn cold and blue. Keep it clean. If the knee sleeve or brace is not waterproof: Do not let it get wet. Cover it with a watertight covering when you take a bath or shower. Activity Rest your knee. Do not do things that cause pain or make pain worse. Avoid activities  where both feet leave the ground at the same time (high-impact activities). Examples are running, jumping rope, and doing jumping jacks. Work with a physical therapist to make a safe exercise program, as told by your doctor. Managing pain, stiffness, and swelling  If told, put ice on the knee. To do this: If you have a removable knee sleeve or brace, take it off as told by your doctor. Put ice in a plastic bag. Place a towel between your skin and the bag. Leave the ice on for 20 minutes, 2-3 times a day. Take off the ice if your skin turns bright red. This is very important. If you cannot feel pain, heat, or cold, you have a greater risk of damage to the area. If told, use an elastic bandage to put pressure (compression) on your injured knee. Raise your knee above the level of your heart while you are sitting or lying down. Sleep with a pillow under your knee. General instructions Take over-the-counter and prescription medicines only as told by your doctor. Do not smoke or use any products that contain nicotine or tobacco. If you need help quitting, ask your doctor. If you are overweight, work with your doctor and a food expert (dietitian) to set goals to lose weight. Being overweight can make your knee  hurt more. Watch for any changes in your symptoms. Keep all follow-up visits. Contact a doctor if: The knee pain does not stop. The knee pain changes or gets worse. You have a fever along with knee pain. Your knee is red or feels warm when you touch it. Your knee gives out or locks up. Get help right away if: Your knee swells, and the swelling gets worse. You cannot move your knee. You have very bad knee pain that does not get better with pain medicine. Summary Many things can cause knee pain. The pain often goes away on its own with time and rest. Your doctor may do tests to find out the cause of the pain. Watch for any changes in your symptoms. Relieve your pain with rest, medicines,  light activity, and use of ice. Get help right away if you cannot move your knee or your knee pain is very bad. This information is not intended to replace advice given to you by your health care provider. Make sure you discuss any questions you have with your health care provider. Document Revised: 02/19/2020 Document Reviewed: 02/19/2020 Elsevier Patient Education  2022 [employer].

## 2021-11-10 ENCOUNTER — Other Ambulatory Visit: Payer: Self-pay

## 2021-11-10 ENCOUNTER — Ambulatory Visit
Admission: RE | Admit: 2021-11-10 | Discharge: 2021-11-10 | Disposition: A | Discharge status: Home / Self Care | Payer: Medicare HMO | Source: Ambulatory Visit | Attending: Medical | Admitting: Medical

## 2021-11-10 ENCOUNTER — Encounter: Payer: Self-pay | Admitting: Physical Therapy

## 2021-11-10 ENCOUNTER — Ambulatory Visit (INDEPENDENT_AMBULATORY_CARE_PROVIDER_SITE_OTHER): Payer: Medicare HMO | Admitting: Physical Therapy

## 2021-11-10 DIAGNOSIS — M25521 Pain in right elbow: Secondary | ICD-10-CM

## 2021-11-10 DIAGNOSIS — M25621 Stiffness of right elbow, not elsewhere classified: Secondary | ICD-10-CM

## 2021-11-10 DIAGNOSIS — R600 Localized edema: Secondary | ICD-10-CM

## 2021-11-10 DIAGNOSIS — M25461 Effusion, right knee: Secondary | ICD-10-CM | POA: Diagnosis not present

## 2021-11-10 DIAGNOSIS — M25561 Pain in right knee: Secondary | ICD-10-CM

## 2021-11-10 DIAGNOSIS — M25631 Stiffness of right wrist, not elsewhere classified: Secondary | ICD-10-CM | POA: Diagnosis not present

## 2021-11-10 DIAGNOSIS — M6281 Muscle weakness (generalized): Secondary | ICD-10-CM

## 2021-11-10 DIAGNOSIS — M1711 Unilateral primary osteoarthritis, right knee: Secondary | ICD-10-CM | POA: Diagnosis not present

## 2021-11-10 NOTE — Therapy (Signed)
[facility] [place], [place], [address] Phone: [phone]   Fax:  [phone]  Physical Therapy Treatment  Patient Details  Name: Marilyn Dixon MRN: [id number] Date of Birth: March 15, 1951 Referring Provider (PT): Jill Alexanders MD   Encounter Date: 11/10/2021   PT End of Session - 11/10/21 1112     Visit Number 12    Number of Visits 15    Date for PT Re-Evaluation 12/03/21    Authorization Type Re-cert sent on 04/30/7516 for 1x/ week for 6 more weeks    Progress Note Due on Visit 19    PT Start Time 1110   pt arrived late   PT Stop Time 1147    PT Time Calculation (min) 37 min    Activity Tolerance Patient tolerated treatment well;No increased pain    Behavior During Therapy WFL for tasks assessed/performed             Past Medical History:  Diagnosis Date   Allergy    Back pain    Dyslipidemia    Glaucoma    High cholesterol    Hx of adenomatous colonic polyps    Menopause    Seasonal affective disorder [facility])     Past Surgical History:  Procedure Laterality Date   COLONOSCOPY  2006/2011   Schooler,Santogade    There were no vitals filed for this visit.   Subjective Assessment - 11/10/21 1112     Subjective feels symptoms are no better/no worse.    Pertinent History NA    Limitations Lifting;House hold activities;Writing    Patient Stated Goals Be able to use her R arm without pain or restriction.    Currently in Pain? Yes    Pain Score 3     Pain Location Elbow    Pain Orientation Right    Pain Descriptors / Indicators Aching;Sore    Pain Type Acute pain    Pain Onset More than a month ago    Pain Frequency Intermittent    Aggravating Factors  RUE use    Pain Relieving Factors better with exercises                               [facility] Adult PT Treatment/Exercise - 11/10/21 1114       Elbow Exercises   Other elbow exercises UBE L3 Push/Pull/Rest 3 minutes fwd and 3 minutes  bkwd    Other elbow exercises wrist extensor stretch 3x20 sec      Wrist Exercises   Wrist Flexion Strengthening;Right;Seated;20 reps    Bar Weights/Barbell (Wrist Flexion) 3 lbs    Wrist Flexion Limitations dropped off edge of table    Wrist Extension Strengthening;Right;Seated;Limitations;20 reps    Bar Weights/Barbell (Wrist Extension) 3 lbs    Wrist Extension Limitations dropped off edge of table      Manual Therapy   Joint Mobilization STM with compression to Rt wrist extensors and triceps              Trigger Point Dry Needling - 11/10/21 1125     Consent Given? Yes    Education Handout Provided Previously provided    Muscles Treated Upper Quadrant Triceps    Muscles Treated Wrist/Hand Extensor carpi radialis longus/brevis;Extensor digitorum;Extensor carpi ulnaris    Electrical Stimulation Performed with Dry Needling Yes    E-stim with Dry Needling Details to tolerance x 5 min to wrist extensors    Triceps Response  Twitch response elicited    Extensor carpi radialis longus/brevis Response Twitch response elicited    Extensor digitorum Response Twitch response elicited    Extensor carpi ulnaris Response Twitch response elicited                     PT Short Term Goals - 11/03/21 1024       PT SHORT TERM GOAL #1   Title Improve R wrist and elbow AROM to 98% or greater as compared to the uninvolved L.    Baseline Met other than elbow extension AROM.    Status Partially Met      PT SHORT TERM GOAL #2   Title Improve R grip strength to > 30 pounds.    Status Achieved               PT Long Term Goals - 11/03/21 1025       PT LONG TERM GOAL #1   Title Improve FOTO to 59 in 13 visits or less.    Baseline 58% on 10/18/2021 (48 on 09/17/2021) (was 27 at eval)    Status On-going      PT LONG TERM GOAL #2   Title Improve R elbow pain to consistently 0-2/10 on the Numeric Pain Rating Scale.    Status On-going      PT LONG TERM GOAL #3   Title  Improve R grip strength to 45 pounds or greater.    Status Achieved      PT LONG TERM GOAL #4   Title Marilyn Dixon will be independent with her long-term HEP at [place].    Status On-going                   Plan - 11/10/21 1148     Clinical Impression Statement Revisited DN and manual therapy today to see if this is beneficial.  Added estim today with DN and manual therapy with positive response.  Will continue to benefit from PT to maximize function.  Anticipate d/c next 1-2 visits.    Examination-Activity Limitations Reach Overhead;Self Feeding;Dressing;Hygiene/Grooming;Toileting;Sleep;Lift;Carry    Examination-Participation Restrictions Interpersonal Relationship;Cleaning;Community Activity;Driving;Meal Prep    Stability/Clinical Decision Making Stable/Uncomplicated    Rehab Potential Good    PT Frequency 1x / week    PT Duration 6 weeks    PT Treatment/Interventions ADLs/Self Care Home Management;Electrical Stimulation;Cryotherapy;Iontophoresis 57m/ml Dexamethasone;Therapeutic activities;Neuromuscular re-education;Therapeutic exercise;Patient/family education;Manual techniques;Dry needling    PT Next Visit Plan Continue to progress wrist extension and supination exercises (Thera- bar), see how DN with estim went    PT Home Exercise Plan Access Code: [email or web address]   Consulted and Agree with Plan of Care Patient             Patient will benefit from skilled therapeutic intervention in order to improve the following deficits and impairments:  Decreased activity tolerance, Decreased endurance, Decreased range of motion, Decreased strength, Increased edema, Impaired perceived functional ability, Impaired UE functional use, Pain  Visit Diagnosis: Stiffness of right elbow, not elsewhere classified  Stiffness of right wrist, not elsewhere classified  Pain in right elbow  Muscle weakness (generalized)  Localized edema     Problem List Patient Active Problem List   Diagnosis Date  Noted   Osteopenia 09/21/2021   Hot flashes 05/10/2021   Status post cataract extraction 12/11/2018   Gastroesophageal reflux disease without esophagitis 12/07/2017   History of colonic polyps 02/19/2016   [place]al affective disorder ([place] 08/17/2015   Glaucoma 07/26/2011   Allergic  rhinitis due to pollen 07/26/2011   Hyperlipidemia LDL goal <100 07/26/2011     Laureen Abrahams, PT, DPT 11/10/21 11:52 AM   [facility] [address] [place], [place], [address] Phone: [phone]   Fax:  [phone]  Name: Marilyn Dixon MRN: [id number] Date of Birth: 02/03/1951

## 2021-11-12 ENCOUNTER — Other Ambulatory Visit: Payer: Self-pay

## 2021-11-12 ENCOUNTER — Ambulatory Visit (INDEPENDENT_AMBULATORY_CARE_PROVIDER_SITE_OTHER): Payer: Medicare HMO | Admitting: Medical

## 2021-11-12 DIAGNOSIS — M25461 Effusion, right knee: Secondary | ICD-10-CM

## 2021-11-12 DIAGNOSIS — M25561 Pain in right knee: Secondary | ICD-10-CM

## 2021-11-12 NOTE — Progress Notes (Signed)
Subjective:  Marilyn Dixon is a 71 y.o. female who presents for Chief Complaint  Patient presents with   Knee Pain   Here for recheck on right knee pain.  I saw her 5 days ago for the same.  She has an acute onset of knee pain and swelling over the last week.    Since last visit she has seen some improvements in the swelling using elevation, ice, ibuprofen.  She also bought a reinforced knee brace/knee sleeve that she is using.  She plans to go to a basketball game at [facility] where she has season tickets tomorrow.  She also notes that is okay for her to ride in the car tomorrow and go attend the game.  Pain started about a week ago.  No recent injury, no fall, no trauma.  No numbness or tingling.  Pain is sharp.   No prior chronic knee issues.  No hip or ankle pain.   Been using some roller massage and topical biofreeze  Been seeing PT for ongoing right tennis elbow issues.  Otherwise no other swelling or joint pain.  No other aggravating or relieving factors.    No other c/o.  The following portions of the patient's history were reviewed and updated as appropriate: allergies, current medications, past family history, past medical history, past social history, past surgical history and problem list.  ROS Otherwise as in subjective above  Objective: There were no vitals taken for this visit.  General appearance: alert, no distress, well developed, well nourished MSK: mild to moderate general swelling of right knee, although improved at least 30 to 40% since Monday 5 days ago.  Decrease ROM of knee.  Tender over patella and lateral joint line, otherwise knee nontender, no discomfort, erythema or bruising, no warmth, no laxity.  Right hip, ankle and rest of leg nontender, without enormity otherwise.     Pulses: 2+ radial pulses, 2+ pedal pulses, normal cap refill Ext: no edema Legs neurovascularly intact   Assessment: Encounter Diagnoses  Name Primary?   Acute pain of  right knee Yes   Pain and swelling of right knee       Plan: We discussed the fact that her knee swelling has improved at least 30 to 40%.  She will continue ice, elevation, relative rest, she can use her knee sleeve/brace that she just started using.  She can use a few more days of over-the-counter anti-inflammatory.  I will go ahead and refer her to physical therapy for further evaluation and treatment.  We discussed aspiration of fluid from the knee joint.  Given that she has had improvement I do not think this is warranted today.  We discussed that the risk outweigh the benefits.   There are no Patient Instructions on file for this visit.   Diagnoses and all orders for this visit:  Acute pain of right knee  Pain and swelling of right knee    Follow up: 4-6 weeks

## 2021-11-12 NOTE — Addendum Note (Signed)
Addended by: Minette Headland A on: 11/12/2021 02:42 PM   Modules accepted: Orders

## 2021-11-15 ENCOUNTER — Other Ambulatory Visit: Payer: Self-pay

## 2021-11-15 ENCOUNTER — Encounter: Payer: Self-pay | Admitting: Physician Assistant

## 2021-11-15 ENCOUNTER — Ambulatory Visit: Payer: Medicare HMO | Admitting: Physician Assistant

## 2021-11-15 ENCOUNTER — Telehealth: Payer: Self-pay | Admitting: Physician Assistant

## 2021-11-15 VITALS — Ht 64.0 in | Wt 148.0 lb

## 2021-11-15 DIAGNOSIS — M25561 Pain in right knee: Secondary | ICD-10-CM

## 2021-11-15 DIAGNOSIS — M1711 Unilateral primary osteoarthritis, right knee: Secondary | ICD-10-CM | POA: Insufficient documentation

## 2021-11-15 MED ORDER — METHYLPREDNISOLONE ACETATE 40 MG/ML IJ SUSP
80.0000 mg | INTRAMUSCULAR | Status: AC | PRN
  Administered 2021-11-15: 80 mg via INTRA_ARTICULAR

## 2021-11-15 MED ORDER — MELOXICAM 7.5 MG PO TABS: 7.5000 mg | ORAL_TABLET | Freq: Every day | ORAL | 0 refills | Status: DC

## 2021-11-15 MED ORDER — LIDOCAINE HCL 1 % IJ SOLN
5.0000 mL | INTRAMUSCULAR | Status: AC | PRN
  Administered 2021-11-15: 5 mL

## 2021-11-15 NOTE — Telephone Encounter (Signed)
Called patient. Script has been sent in and she is aware.

## 2021-11-15 NOTE — Telephone Encounter (Signed)
Patient called. Says she went to pick up the medication and it has not been sent in yet. Would like medication RX sent to pharmacy.

## 2021-11-15 NOTE — Progress Notes (Signed)
Office Visit Note   Patient: Marilyn Dixon           Date of Birth: February 18, 1951           MRN: [id number] Visit Date: 11/15/2021              Requested by: Carlena Hurl, PA-C [address] [place],  [place] [address] PCP: Denita Lung, MD  Chief Complaint  Patient presents with   Right Knee - Pain      HPI: Patient is a pleasant 71 year old woman with a 1 week history of right knee pain.  She denies any injury she said she was just getting up and had pain at her patella.  At that point she went to her PCP and had an x-ray.  She did have a small effusion.  Referred here.  She has been taking Aleve and icing resting and elevating her leg.  She is also been using a brace.  She is very active and is concerned because she has a trip coming up in a few months.  She also has a charity event this evening.  She does say the bracing is helping most of her pain is focused in the patella but overall medial joint line  Assessment & Plan: Visit Diagnoses: No diagnosis found.  Plan: 71 year old woman with advanced patellofemoral and medial compartment arthritis.  This was not symptomatic at all until a week ago.  Findings also could be a degenerative tear of the medial meniscus or fracture of one of the osteophytes on the patella though this is not evident by her x-rays.  Her x-rays demonstrate advanced patellofemoral arthritis with osteophyte formation as well as medial compartment joint space narrowing and osteophyte formation no acute findings.  I recommend that she try an injection today which she is willing to do.  She understands she may initially have some more pain and less.  I did also recommend Mobic instead of Aleve and Advil I will call this into her today.  She will follow-up with Dr. Durward Fortes in 2 weeks.  Follow-Up Instructions: No follow-ups on file.   Ortho Exam  Patient is alert, oriented, no adenopathy, well-dressed, normal affect, normal respiratory  effort. Examination of her right knee no effusion today no redness no cellulitis.  She prefers to keep her leg straighter.  Flexion reproduces pain in the medial joint and in the patellofemoral joint.  She does have some grinding with range of motion.  Good varus valgus stability.  She is tender to palpation of the medial joint line and suprapatellar.  Imaging: No results found. No images are attached to the encounter.  Labs: No results found for: HGBA1C, ESRSEDRATE, CRP, LABURIC, REPTSTATUS, GRAMSTAIN, CULT, LABORGA   Lab Results  Component Value Date   ALBUMIN 4.6 05/10/2021   ALBUMIN 4.7 12/17/2019   ALBUMIN 4.4 12/11/2018    No results found for: MG Lab Results  Component Value Date   VD25OH 47 08/25/2016    No results found for: PREALBUMIN CBC EXTENDED Latest Ref Rng & Units 05/10/2021 12/17/2019 12/11/2018  WBC 3.4 - 10.8 x10E3/uL 7.9 6.4 6.3  RBC 3.77 - 5.28 x10E6/uL 4.94 5.11 5.09  HGB 11.1 - 15.9 g/dL 15.2 16.2(H) 15.3  HCT 34.0 - 46.6 % 44.2 46.8(H) 45.4  PLT 150 - 450 x10E3/uL 247 233 258  NEUTROABS 1.4 - 7.0 x10E3/uL 5.8 4.0 3.9  LYMPHSABS 0.7 - 3.1 x10E3/uL 1.5 1.6 1.7     Body mass index is 25.4  kg/m.  Orders:  No orders of the defined types were placed in this encounter.  No orders of the defined types were placed in this encounter.    Procedures: Large Joint Inj: R knee on 11/15/2021 11:17 AM Indications: pain and diagnostic evaluation Details: 25 G 1.5 in needle, anteromedial approach  Arthrogram: No  Medications: 80 mg methylPREDNISolone acetate 40 MG/ML; 5 mL lidocaine 1 % Outcome: tolerated well, no immediate complications Procedure, treatment alternatives, risks and benefits explained, specific risks discussed. Consent was given by the patient.     Clinical Data: No additional findings.  ROS:  All other systems negative, except as noted in the HPI. Review of Systems  Objective: Vital Signs: Ht 5\' 4"  (1.626 m)    Wt 148 lb (67.1 kg)     BMI 25.40 kg/m   Specialty Comments:  No specialty comments available.  PMFS History: Patient Active Problem List   Diagnosis Date Noted   Osteopenia 09/21/2021   Hot flashes 05/10/2021   Status post cataract extraction 12/11/2018   Gastroesophageal reflux disease without esophagitis 12/07/2017   History of colonic polyps 02/19/2016   Seasonal affective disorder ([place]) 08/17/2015   Glaucoma 07/26/2011   Allergic rhinitis due to pollen 07/26/2011   Hyperlipidemia LDL goal <100 07/26/2011   Past Medical History:  Diagnosis Date   Allergy    Back pain    Dyslipidemia    Glaucoma    High cholesterol    Hx of adenomatous colonic polyps    Menopause    Seasonal affective disorder ([place])     No family history on file.  Past Surgical History:  Procedure Laterality Date   COLONOSCOPY  2006/2011   Schooler,Santogade   Social History   Occupational History   Not on file  Tobacco Use   Smoking status: Never   Smokeless tobacco: Never  Substance and Sexual Activity   Alcohol use: Yes    Alcohol/week: 3.0 standard drinks    Types: 3 drink(s) per week   Drug use: No   Sexual activity: Yes

## 2021-11-15 NOTE — Addendum Note (Signed)
Addended by: Georgette Dover on: 11/15/2021 12:32 PM   Modules accepted: Orders

## 2021-11-17 ENCOUNTER — Other Ambulatory Visit: Payer: Self-pay

## 2021-11-17 ENCOUNTER — Ambulatory Visit: Payer: Medicare HMO | Admitting: Physical Therapy

## 2021-11-17 ENCOUNTER — Encounter: Payer: Self-pay | Admitting: Physical Therapy

## 2021-11-17 DIAGNOSIS — M25521 Pain in right elbow: Secondary | ICD-10-CM | POA: Diagnosis not present

## 2021-11-17 DIAGNOSIS — M25621 Stiffness of right elbow, not elsewhere classified: Secondary | ICD-10-CM

## 2021-11-17 DIAGNOSIS — M6281 Muscle weakness (generalized): Secondary | ICD-10-CM | POA: Diagnosis not present

## 2021-11-17 DIAGNOSIS — M25631 Stiffness of right wrist, not elsewhere classified: Secondary | ICD-10-CM

## 2021-11-17 DIAGNOSIS — R600 Localized edema: Secondary | ICD-10-CM

## 2021-11-17 NOTE — Therapy (Signed)
[place] [facility] ?[address] ?[place], [place], [address] ?Phone: [phone]   Fax:  [phone] ? ?Physical Therapy Treatment ? ?Patient Details  ?Name: Marilyn Dixon ?MRN: [id number] ?Date of Birth: June 12, 1951 ?Referring Provider (PT): Jill Alexanders MD ? ? ?Encounter Date: 11/17/2021 ? ? PT End of Session - 11/17/21 1025   ? ? Visit Number 13   ? Number of Visits 15   ? Date for PT Re-Evaluation 12/03/21   ? Authorization Type Re-cert sent on 0/62/3762 for 1x/ week for 6 more weeks   ? Progress Note Due on Visit 19   ? PT Start Time 1020   ? PT Stop Time 1100   ? PT Time Calculation (min) 40 min   ? Activity Tolerance Patient tolerated treatment well;No increased pain   ? Behavior During Therapy [facility] for tasks assessed/performed   ? ?  ?  ? ?  ? ? ?Past Medical History:  ?Diagnosis Date  ? Allergy   ? Back pain   ? Dyslipidemia   ? Glaucoma   ? High cholesterol   ? Hx of adenomatous colonic polyps   ? Menopause   ? Seasonal affective disorder ([place])   ? ? ?Past Surgical History:  ?Procedure Laterality Date  ? COLONOSCOPY  2006/2011  ? Schooler,Santogade  ? ? ?There were no vitals filed for this visit. ? ? Subjective Assessment - 11/17/21 1027   ? ? Subjective Pt arriving reporting 2-3/10 pain in elbow.   ? Patient Stated Goals Be able to use her R arm without pain or restriction.   ? Currently in Pain? Yes   ? Pain Score 3    ? Pain Location Elbow   ? Pain Orientation Right   ? Pain Descriptors / Indicators Sore;Discomfort   ? Pain Type Acute pain   ? Pain Onset More than a month ago   ? ?  ?  ? ?  ? ? ? ? ? [facility] PT Assessment - 11/17/21 0001   ? ?  ? AROM  ? AROM Assessment Site Elbow   ? Right/Left Elbow Right   ? Right Elbow Flexion 142   ? Right Elbow Extension 10   ? ?  ?  ? ?  ? ? ? ? ? ? ? ? ? ? ? ? ? ? ? ? [place] Adult PT Treatment/Exercise - 11/17/21 0001   ? ?  ? Elbow Exercises  ? Other elbow exercises UBE L3 Push/Pull/Rest 3 minutes fwd and 3 minutes bkwd, hanging body  weight as tolerated from lat pull downs holding 5-10 seconds   ? Other elbow exercises wrist extensor stretch 3x20 sec   ?  ? Wrist Exercises  ? Wrist Flexion Strengthening;Right;Seated;20 reps   ? Bar Weights/Barbell (Wrist Flexion) 4 lbs   ? Wrist Flexion Limitations dropped off edge of mat table   ? Wrist Extension Strengthening;Right;Seated;Limitations;20 reps   ? Bar Weights/Barbell (Wrist Extension) 4 lbs   ? Wrist Extension Limitations dropped of edge of table   ?  ? Manual Therapy  ? Joint Mobilization elbow mobs, gentle distraction   ? ?  ?  ? ?  ? ? ? ? ? ? ? ? ? ? ? ? PT Short Term Goals - 11/17/21 1051   ? ?  ? PT SHORT TERM GOAL #1  ? Status On-going   ?  ? PT SHORT TERM GOAL #2  ? Title Improve R grip strength to > 30 pounds.   ?  Status Achieved   ? ?  ?  ? ?  ? ? ? ? PT Long Term Goals - 11/17/21 1052   ? ?  ? PT LONG TERM GOAL #1  ? Title Improve FOTO to 59 in 13 visits or less.   ? Baseline 58% on 10/18/2021 (48 on 09/17/2021) (was 27 at eval)   ? Status On-going   ? [place] Date 12/03/21   ?  ? PT LONG TERM GOAL #2  ? Title Improve R elbow pain to consistently 0-2/10 on the Numeric Pain Rating Scale.   ? Status Achieved   ?  ? PT LONG TERM GOAL #3  ? Title Improve R grip strength to 45 pounds or greater.   ? Baseline Met   ? Status Achieved   ?  ? PT LONG TERM GOAL #4  ? Title Baylee will be independent with her long-term HEP at [place].   ? Status On-going   ? ?  ?  ? ?  ? ? ? ? ? ? ? ? Plan - 11/17/21 1028   ? ? Clinical Impression Statement Pt arriving today reporting 2-3/10 pain in right elbow. Pt with bruising noted from DN with E-stim  at last visit and wishing to hold off on DN this visit. Pt still wearing her compression sleeve which she feels helps. Pt with good response to manual therapy. We discussed having 2 more visits before discharge.   ? Examination-Activity Limitations Reach Overhead;Self Feeding;Dressing;Hygiene/Grooming;Toileting;Sleep;Lift;Carry   ? Examination-Participation  Restrictions Interpersonal Relationship;Cleaning;Community Activity;Driving;Meal Prep   ? Stability/Clinical Decision Making Stable/Uncomplicated   ? Rehab Potential Good   ? PT Frequency 1x / week   ? PT Duration 6 weeks   ? PT Treatment/Interventions ADLs/Self Care Home Management;Electrical Stimulation;Cryotherapy;Iontophoresis 51m/ml Dexamethasone;Therapeutic activities;Neuromuscular re-education;Therapeutic exercise;Patient/family education;Manual techniques;Dry needling   ? PT Next Visit Plan Continue to progress wrist extension and supination exercises (Thera- bar), manual therapy, DN if needed again.   ? PT Home Exercise Plan Access Code: [email or web address]  ? Consulted and Agree with Plan of Care Patient   ? ?  ?  ? ?  ? ? ?Patient will benefit from skilled therapeutic intervention in order to improve the following deficits and impairments:  Decreased activity tolerance, Decreased endurance, Decreased range of motion, Decreased strength, Increased edema, Impaired perceived functional ability, Impaired UE functional use, Pain ? ?Visit Diagnosis: ?Stiffness of right elbow, not elsewhere classified ? ?Stiffness of right wrist, not elsewhere classified ? ?Pain in right elbow ? ?Muscle weakness (generalized) ? ?Localized edema ? ? ? ? ?Problem List ?Patient Active Problem List  ? Diagnosis Date Noted  ? Pain in right knee 11/15/2021  ? Osteopenia 09/21/2021  ? Hot flashes 05/10/2021  ? Status post cataract extraction 12/11/2018  ? Gastroesophageal reflux disease without esophagitis 12/07/2017  ? History of colonic polyps 02/19/2016  ? [place]al [place] ([place] 08/17/2015  ? Glaucoma 07/26/2011  ? Allergic rhinitis due to pollen 07/26/2011  ? Hyperlipidemia LDL goal <100 07/26/2011  ? ? ?JOretha Caprice PT, MPT ?11/17/2021, 10:53 AM ? ?[place] ?[facility] ?[address] [address]?Phone: [phone]  Fax:  [phone]? ?Name: LTemesha Queener?MRN:  [id number]?Date of Birth: [birth year]? ? ? ?

## 2021-11-24 ENCOUNTER — Other Ambulatory Visit: Payer: Self-pay

## 2021-11-24 ENCOUNTER — Encounter: Payer: Self-pay | Admitting: Physical Therapy

## 2021-11-24 ENCOUNTER — Encounter: Payer: Medicare HMO | Admitting: Physical Therapy

## 2021-11-24 ENCOUNTER — Ambulatory Visit: Payer: Medicare HMO | Admitting: Physical Therapy

## 2021-11-24 DIAGNOSIS — M25621 Stiffness of right elbow, not elsewhere classified: Secondary | ICD-10-CM

## 2021-11-24 DIAGNOSIS — M6281 Muscle weakness (generalized): Secondary | ICD-10-CM

## 2021-11-24 DIAGNOSIS — M25631 Stiffness of right wrist, not elsewhere classified: Secondary | ICD-10-CM

## 2021-11-24 DIAGNOSIS — M25521 Pain in right elbow: Secondary | ICD-10-CM

## 2021-11-24 DIAGNOSIS — R600 Localized edema: Secondary | ICD-10-CM

## 2021-11-24 NOTE — Therapy (Signed)
[place] ?[facility] ?[address] ?[place], [place], [address] ?Phone: [phone]   Fax:  [phone] ? ?Physical Therapy Treatment ? ?Patient Details  ?Name: Marilyn Dixon ?MRN: [id number] ?Date of Birth: [birth year] ?Referring Provider (PT): Jill Alexanders MD ? ? ?Encounter Date: 11/24/2021 ? ? PT End of Session - 11/24/21 0951   ? ? Visit Number 14   ? Number of Visits 15   ? Date for PT Re-Evaluation 12/03/21   ? Authorization Type Re-cert sent on 09/29/7354 for 1x/ week for 6 more weeks   ? Progress Note Due on Visit 19   ? PT Start Time [phone]   ? PT Stop Time 1015   ? PT Time Calculation (min) 42 min   ? Activity Tolerance Patient tolerated treatment well;No increased pain   ? Behavior During Therapy [facility] for tasks assessed/performed   ? ?  ?  ? ?  ? ? ?Past Medical History:  ?Diagnosis Date  ? Allergy   ? Back pain   ? Dyslipidemia   ? Glaucoma   ? High cholesterol   ? Hx of adenomatous colonic polyps   ? Menopause   ? Seasonal affective disorder ([place])   ? ? ?Past Surgical History:  ?Procedure Laterality Date  ? COLONOSCOPY  2006/2011  ? Schooler,Santogade  ? ? ?There were no vitals filed for this visit. ? ? Subjective Assessment - 11/24/21 1031   ? ? Subjective She denies pain in her elbow today, she feels ready to finish up with PT next week.   ? Pertinent History NA   ? Limitations Lifting;House hold activities;Writing   ? Patient Stated Goals Be able to use her R arm without pain or restriction.   ? Pain Onset More than a month ago   ? ?  ?  ? ?  ? ? ? [facility] PT Treatment/Exercise - 11/24/21 0001   ? ?  ? Elbow Exercises  ? Elbow Flexion Right;20 reps;Standing;Strengthening   ? Elbow Flexion Limitations 3#   ? Elbow Extension Strengthening;20 reps   ? Theraband Level (Elbow Extension) Level 3 (Green)   ? Other elbow exercises standing shoulder flexion and abd 2X10 with 2#   ?  ? Wrist Exercises  ? Wrist Flexion Strengthening;Right;Seated;20 reps   ? Bar Weights/Barbell (Wrist  Flexion) 3 lbs   ? Wrist Flexion Limitations s   ? Wrist Extension Strengthening;Right;Seated;Limitations;20 reps   ? Bar Weights/Barbell (Wrist Extension) 3 lbs   ? Other wrist exercises Supination and Pronation with 3# seated Rt. UE 2 x 10   ? ?  ?  ? ?  ? ? ? ? ? ? ? ? ? ? ? ? PT Short Term Goals - 11/17/21 1051   ? ?  ? PT SHORT TERM GOAL #1  ? Status On-going   ?  ? PT SHORT TERM GOAL #2  ? Title Improve R grip strength to > 30 pounds.   ? Status Achieved   ? ?  ?  ? ?  ? ? ? ? PT Long Term Goals - 11/17/21 1052   ? ?  ? PT LONG TERM GOAL #1  ? Title Improve FOTO to 59 in 13 visits or less.   ? Baseline 58% on 10/18/2021 (48 on 09/17/2021) (was 27 at eval)   ? Status On-going   ? Target Date 12/03/21   ?  ? PT LONG TERM GOAL #2  ? Title Improve R elbow pain to consistently 0-2/10 on the  Numeric Pain Rating Scale.   ? Status Achieved   ?  ? PT LONG TERM GOAL #3  ? Title Improve R grip strength to 45 pounds or greater.   ? Baseline Met   ? Status Achieved   ?  ? PT LONG TERM GOAL #4  ? Title Rayen will be independent with her long-term HEP at DC.   ? Status On-going   ? ?  ?  ? ?  ? ? ? ? ? ? ? ? Plan - 11/24/21 1032   ? ? Clinical Impression Statement I progressed and updated her final HEP and reviewed this today. She has made great overall progress and we will plan to discharge next visit to independent program.   ? Examination-Activity Limitations Reach Overhead;Self Feeding;Dressing;Hygiene/Grooming;Toileting;Sleep;Lift;Carry   ? Examination-Participation Restrictions Interpersonal Relationship;Cleaning;Community Activity;Driving;Meal Prep   ? Stability/Clinical Decision Making Stable/Uncomplicated   ? Rehab Potential Good   ? PT Frequency 1x / week   ? PT Duration 6 weeks   ? PT Treatment/Interventions ADLs/Self Care Home Management;Electrical Stimulation;Cryotherapy;Iontophoresis 50m/ml Dexamethasone;Therapeutic activities;Neuromuscular re-education;Therapeutic exercise;Patient/family education;Manual  techniques;Dry needling   ? PT Next Visit Plan How was new HEP, DC next visit   ? PT Home Exercise Plan Access Code: [email or web address]  ? Consulted and Agree with Plan of Care Patient   ? ?  ?  ? ?  ? ? ?Patient will benefit from skilled therapeutic intervention in order to improve the following deficits and impairments:  Decreased activity tolerance, Decreased endurance, Decreased range of motion, Decreased strength, Increased edema, Impaired perceived functional ability, Impaired UE functional use, Pain ? ?Visit Diagnosis: ?Stiffness of right elbow, not elsewhere classified ? ?Stiffness of right wrist, not elsewhere classified ? ?Pain in right elbow ? ?Muscle weakness (generalized) ? ?Localized edema ? ? ? ? ?Problem List ?Patient Active Problem List  ? Diagnosis Date Noted  ? Pain in right knee 11/15/2021  ? Osteopenia 09/21/2021  ? Hot flashes 05/10/2021  ? Status post cataract extraction 12/11/2018  ? Gastroesophageal reflux disease without esophagitis 12/07/2017  ? History of colonic polyps 02/19/2016  ? Seasonal affective disorder ([place] 08/17/2015  ? Glaucoma 07/26/2011  ? Allergic rhinitis due to pollen 07/26/2011  ? Hyperlipidemia LDL goal <100 07/26/2011  ? ? ?BDebbe Odea PT,DPT ?11/24/2021, 10:36 AM ? ?[place] ?[facility] ?[address] [address]?Phone: [phone]  Fax:  [phone]? ?Name: Marilyn Dixon?MRN: [id number]?Date of Birth: [birth year]? ? ? ?

## 2021-11-30 ENCOUNTER — Other Ambulatory Visit: Payer: Self-pay

## 2021-11-30 ENCOUNTER — Ambulatory Visit: Payer: Medicare HMO | Admitting: Orthopaedic Surgery

## 2021-11-30 ENCOUNTER — Encounter: Payer: Self-pay | Admitting: Orthopaedic Surgery

## 2021-11-30 DIAGNOSIS — M25561 Pain in right knee: Secondary | ICD-10-CM | POA: Diagnosis not present

## 2021-11-30 NOTE — Progress Notes (Signed)
? ?Office Visit Note ?  ?Patient: Marilyn Dixon           ?Date of Birth: 08-29-1951           ?MRN: [id number] ?Visit Date: 11/30/2021 ?             ?Requested by: Denita Lung, MD ?[address] ?[place],  [place] [address] ?PCP: Denita Lung, MD ? ? ?Assessment & Plan: ?Visit Diagnoses:  ?1. Acute pain of right knee   ? ? ?Plan: Marilyn Dixon has been followed for the problem referable to her right knee.  She simply woke up 1 day with considerable pain in her right knee and probably a small effusion.  She was seen by her primary care physician with x-rays demonstrating tricompartmental degenerative changes predominantly about the patellofemoral joint..  She has been wearing a brace which seems to help.  Marilyn Dixon injected her knee with cortisone and she notes that it made a difference on to have some recurrence of the pain.  She has had more trouble with flexion and extension of her knee and going up and down inclines.  She certainly has evidence of tricompartmental degenerative arthritis but predominately about the patellofemoral joint where she is experiencing most of her pain anteriorly.  Long discussion over 30 to 40 minutes regarding treatment options.  Just because she still having so much trouble I think it is worth obtaining an MRI scan to be sure she does not have a loose body or some other pathology in addition to the arthritis.  In the meantime she can use over-the-counter anti-inflammatory medicines and we have discussed appropriate dosages.  She could try Voltaren gel and continue with the brace.  She and her husband are traveling by car to [place] over the weekend and she should get out of the car exercise or at least move around every so many hours so she will get stiff. ? ?Follow-Up Instructions: Return if symptoms worsen or fail to improve.  ? ?Orders:  ?Orders Placed This Encounter  ?Procedures  ? MR Knee Right w/o contrast  ? ?No orders of the defined types were placed in this  encounter. ? ? ? ? Procedures: ?No procedures performed ? ? ?Clinical Data: ?No additional findings. ? ? ?Subjective: ?Chief Complaint  ?Patient presents with  ? Right Knee - Follow-up  ?Patient presents today for follow up on her right knee. She received a cortisone injection two weeks ago. She states that the injection did help some. She was driving today and states that it feels like something is in her knee. She has been icing, bracing, elevating, and taking meloxicam as needed. She has questions regarding travel to [place] this week.  ? ?HPI ? ?Review of Systems ? ? ?Objective: ?Vital Signs: There were no vitals taken for this visit. ? ?Physical Exam ?Constitutional:   ?   Appearance: She is well-developed.  ?Eyes:  ?   Pupils: Pupils are equal, round, and reactive to light.  ?Pulmonary:  ?   Effort: Pulmonary effort is normal.  ?Skin: ?   General: Skin is warm and dry.  ?Neurological:  ?   Mental Status: She is alert and oriented to person, place, and time.  ?Psychiatric:     ?   Behavior: Behavior normal.  ? ? ?Ortho Exam right knee was not hot warm or red.  Might have a very minimal effusion.  Full extension flexed over 100 degrees without instability.  Certainly has considerable patella crepitation and  some pain with patella compression.  Very minimal medial and lateral joint pain.  No popliteal pain or mass.  No calf pain.  No distal edema ? ?Specialty Comments:  ?No specialty comments available. ? ?Imaging: ?No results found. ? ? ?PMFS History: ?Patient Active Problem List  ? Diagnosis Date Noted  ? Pain in right knee 11/15/2021  ? Osteopenia 09/21/2021  ? Hot flashes 05/10/2021  ? Status post cataract extraction 12/11/2018  ? Gastroesophageal reflux disease without esophagitis 12/07/2017  ? History of colonic polyps 02/19/2016  ? Seasonal affective disorder ([place]) 08/17/2015  ? Glaucoma 07/26/2011  ? Allergic rhinitis due to pollen 07/26/2011  ? Hyperlipidemia LDL goal <100 07/26/2011  ? ?Past Medical  History:  ?Diagnosis Date  ? Allergy   ? Back pain   ? Dyslipidemia   ? Glaucoma   ? High cholesterol   ? Hx of adenomatous colonic polyps   ? Menopause   ? Seasonal affective disorder ([place])   ?  ?History reviewed. No pertinent family history.  ?Past Surgical History:  ?Procedure Laterality Date  ? COLONOSCOPY  2006/2011  ? Schooler,Santogade  ? ?Social History  ? ?Occupational History  ? Not on file  ?Tobacco Use  ? Smoking status: Never  ? Smokeless tobacco: Never  ?Substance and Sexual Activity  ? Alcohol use: Yes  ?  Alcohol/week: 3.0 standard drinks  ?  Types: 3 drink(s) per week  ? Drug use: No  ? Sexual activity: Yes  ? ? ? ? ? ? ?

## 2021-12-01 ENCOUNTER — Encounter: Payer: Self-pay | Admitting: Physical Therapy

## 2021-12-01 ENCOUNTER — Ambulatory Visit: Payer: Medicare HMO | Admitting: Physical Therapy

## 2021-12-01 DIAGNOSIS — M25621 Stiffness of right elbow, not elsewhere classified: Secondary | ICD-10-CM | POA: Diagnosis not present

## 2021-12-01 DIAGNOSIS — M25521 Pain in right elbow: Secondary | ICD-10-CM

## 2021-12-01 DIAGNOSIS — R600 Localized edema: Secondary | ICD-10-CM | POA: Diagnosis not present

## 2021-12-01 DIAGNOSIS — M6281 Muscle weakness (generalized): Secondary | ICD-10-CM

## 2021-12-01 DIAGNOSIS — M25631 Stiffness of right wrist, not elsewhere classified: Secondary | ICD-10-CM

## 2021-12-01 NOTE — Therapy (Signed)
[place] ?[facility] ?[address] ?[place], [place], [address] ?Phone: [phone]   Fax:  [phone] ? ?Physical Therapy Treatment/Discharge ?PHYSICAL THERAPY DISCHARGE SUMMARY ? ?Visits from Start of Care: 15 ? ?Current functional level related to goals / functional outcomes: ?See below ?  ?Remaining deficits: ?See below ?  ?Education / Equipment: ?HEP ?Plan: ?Patient agrees to discharge.  Patient goals were met. Patient is being discharged due to meeting the stated rehab goals.    ? ? ? ? ?Patient Details  ?Name: Marilyn Dixon ?MRN: [id number] ?Date of Birth: [birth year] ?Referring Provider (PT): Jill Alexanders MD ? ? ?Encounter Date: 12/01/2021 ? ? PT End of Session - 12/01/21 0904   ? ? Visit Number 15   ? Number of Visits 15   ? Date for PT Re-Evaluation 12/03/21   ? Authorization Type Re-cert sent on 9/62/8366 for 1x/ week for 6 more weeks   ? Progress Note Due on Visit 19   ? PT Start Time [phone]   ? PT Stop Time 0930   ? PT Time Calculation (min) 43 min   ? Activity Tolerance Patient tolerated treatment well;No increased pain   ? Behavior During Therapy [facility] for tasks assessed/performed   ? ?  ?  ? ?  ? ? ?Past Medical History:  ?Diagnosis Date  ? Allergy   ? Back pain   ? Dyslipidemia   ? Glaucoma   ? High cholesterol   ? Hx of adenomatous colonic polyps   ? Menopause   ? Seasonal affective disorder ([place])   ? ? ?Past Surgical History:  ?Procedure Laterality Date  ? COLONOSCOPY  2006/2011  ? Schooler,Santogade  ? ? ?There were no vitals filed for this visit. ? ? Subjective Assessment - 12/01/21 0905   ? ? Subjective No pain in elbow feels ready to DC from PT for this. She was found to have Rt knee OA and will have MRI for this due to continued knee pain.   ? Pertinent History NA   ? Limitations Lifting;House hold activities;Writing   ? Patient Stated Goals Be able to use her R arm without pain or restriction.   ? Pain Onset More than a month ago   ? ?  ?  ? ?  ? ? ? ? ? [facility] PT  Assessment - 12/01/21 0001   ? ?  ? Assessment  ? Medical Diagnosis Rt lateral epicondylitis   ? Referring Provider (PT) Jill Alexanders MD   ? Onset Date/Surgical Date 08/07/21   ?  ? Observation/Other Assessments  ? Focus on Therapeutic Outcomes (FOTO)  now 71% (Goal was 59 in 13 visits)   ?  ? AROM  ? Right Elbow Flexion --   WNL  ? Right Elbow Extension 5   ?  ? Strength  ? Overall Strength Comments Rt elbow 5/5 MMT. Grip strength R in pounds: 40.7 pounds and equal to left   ? ?  ?  ? ?  ? ? ? ? ? ? ? ? ? ? ? ? ? ? ? ? [facility] Adult PT Treatment/Exercise - 12/01/21 0001   ? ?  ? Elbow Exercises  ? Elbow Flexion Right;20 reps;Standing;Strengthening   ? Elbow Flexion Limitations 4#   ? Elbow Extension Strengthening;20 reps   ? Theraband Level (Elbow Extension) Level 3 (Green)   ? Other elbow exercises green therabar pronation X 2 min, supination X 2 min, wrist extension X 1 min and wrist flexion X 1 min  all with green bar. Blue digigrip resistance for gripping 2X20 bilat   ? Other elbow exercises standing shoulder flexion and abd 2X10 with 2#   ?  ? Wrist Exercises  ? Other wrist exercises finger web 1 min flexion, 1 min extension   ? ?  ?  ? ?  ? ? ? ? ? ? ? ? ? ? ? ? PT Short Term Goals - 12/01/21 0924   ? ?  ? PT SHORT TERM GOAL #1  ? Title Improve R wrist and elbow AROM to 98% or greater as compared to the uninvolved L.   ? Baseline now met   ? Status Achieved   ?  ? PT SHORT TERM GOAL #2  ? Title Improve R grip strength to > 30 pounds.   ? Status Achieved   ? ?  ?  ? ?  ? ? ? ? PT Long Term Goals - 12/01/21 0924   ? ?  ? PT LONG TERM GOAL #1  ? Title Improve FOTO to 59 in 13 visits or less.   ? Baseline now 71   ? Status Achieved   ? Target Date 12/03/21   ?  ? PT LONG TERM GOAL #2  ? Title Improve R elbow pain to consistently 0-2/10 on the Numeric Pain Rating Scale.   ? Status Achieved   ?  ? PT LONG TERM GOAL #3  ? Title Improve R grip strength to 45 pounds or greater.   ? Baseline 41 today at one point had  scored 46   ? Status Partially Met   ?  ? PT LONG TERM GOAL #4  ? Title [place] will be independent with her long-term HEP at DC.   ? Status Achieved   ? ?  ?  ? ?  ? ? ? ? ? ? ? ? Plan - 12/01/21 0927   ? ? Clinical Impression Statement She has met her PT goals and feels ready to discharge for her Rt elbow to her independent program. She had no further questions or concerns about her Rt elbow. She will continue to follow up with MD about her Rt knee pain and will have MRI for this.   ? Examination-Activity Limitations Reach Overhead;Self Feeding;Dressing;Hygiene/Grooming;Toileting;Sleep;Lift;Carry   ? Examination-Participation Restrictions Interpersonal Relationship;Cleaning;Community Activity;Driving;Meal Prep   ? Stability/Clinical Decision Making Stable/Uncomplicated   ? Rehab Potential Good   ? PT Frequency 1x / week   ? PT Duration 6 weeks   ? PT Treatment/Interventions ADLs/Self Care Home Management;Electrical Stimulation;Cryotherapy;Iontophoresis 71m/ml Dexamethasone;Therapeutic activities;Neuromuscular re-education;Therapeutic exercise;Patient/family education;Manual techniques;Dry needling   ? PT Next Visit Plan DC today   ? PT Home Exercise Plan Access Code: [email or web address] [phone]  ? Consulted and Agree with Plan of Care Patient   ? ?  ?  ? ?  ? ? ?Patient will benefit from skilled therapeutic intervention in order to improve the following deficits and impairments:  Decreased activity tolerance, Decreased endurance, Decreased range of motion, Decreased strength, Increased edema, Impaired perceived functional ability, Impaired UE functional use, Pain ? ?Visit Diagnosis: ?Stiffness of right elbow, not elsewhere classified ? ?Stiffness of right wrist, not elsewhere classified ? ?Pain in right elbow ? ?Muscle weakness (generalized) ? ?Localized edema ? ? ? ? ?Problem List ?Patient Active Problem List  ? Diagnosis Date Noted  ? Pain in right knee 11/15/2021  ? Osteopenia 09/21/2021  ? Hot flashes 05/10/2021  ? Status post  cataract extraction 12/11/2018  ? Gastroesophageal reflux disease without esophagitis  12/07/2017  ? History of colonic polyps 02/19/2016  ? Seasonal affective disorder ([place]) 08/17/2015  ? Glaucoma 07/26/2011  ? Allergic rhinitis due to pollen 07/26/2011  ? Hyperlipidemia LDL goal <100 07/26/2011  ? ? ?Debbe Odea, PT,DPT ?12/01/2021, 9:28 AM ? ?[place] ?[facility] ?[address] ?[place], [place], [address] ?Phone: [phone]   Fax:  [phone] ? ?Name: Marilyn Dixon ?MRN: [id number] ?Date of Birth: 04-17-1951 ? ? ? ?

## 2021-12-03 ENCOUNTER — Other Ambulatory Visit: Payer: Self-pay | Admitting: Family Medicine

## 2021-12-03 DIAGNOSIS — E785 Hyperlipidemia, unspecified: Secondary | ICD-10-CM

## 2021-12-12 ENCOUNTER — Other Ambulatory Visit: Payer: Self-pay | Admitting: Physician Assistant

## 2021-12-15 ENCOUNTER — Other Ambulatory Visit: Payer: Self-pay

## 2021-12-15 ENCOUNTER — Ambulatory Visit
Admission: RE | Admit: 2021-12-15 | Discharge: 2021-12-15 | Disposition: A | Discharge status: Home / Self Care | Payer: Medicare HMO | Source: Ambulatory Visit | Attending: Orthopaedic Surgery | Admitting: Orthopaedic Surgery

## 2021-12-15 DIAGNOSIS — M25461 Effusion, right knee: Secondary | ICD-10-CM | POA: Diagnosis not present

## 2021-12-15 DIAGNOSIS — M1711 Unilateral primary osteoarthritis, right knee: Secondary | ICD-10-CM | POA: Diagnosis not present

## 2021-12-15 DIAGNOSIS — M25561 Pain in right knee: Secondary | ICD-10-CM

## 2021-12-15 DIAGNOSIS — S83271A Complex tear of lateral meniscus, current injury, right knee, initial encounter: Secondary | ICD-10-CM | POA: Diagnosis not present

## 2021-12-16 ENCOUNTER — Telehealth: Payer: Self-pay | Admitting: Orthopedic Surgery

## 2021-12-16 NOTE — Telephone Encounter (Signed)
Marilyn Dixon had the MRI of her knee yesterday.  She called in to make an appt for follow up to discuss the results.  I put her on the scheduled for Dr. Rudene Anda first available appointment which is 12/28/21.  She would like to know if we could possibly call to discuss her results since the follow up appt is so far out.  Please call [phone]. ?

## 2021-12-16 NOTE — Telephone Encounter (Signed)
Called with long discussion re MRI results and treatment options

## 2021-12-16 NOTE — Telephone Encounter (Signed)
Patient came into the office and would like to know if she can get the results of her mri I stated that dr whitfield was in clinic and that he would contact her with the results. She stated that if she missed the call would he leave a call back number I stated that he would likely leave a VM and she can call back with questions.  ? ?Please advise  ?

## 2021-12-17 NOTE — Telephone Encounter (Signed)
noted 

## 2021-12-18 ENCOUNTER — Other Ambulatory Visit: Payer: Medicare HMO

## 2021-12-21 ENCOUNTER — Other Ambulatory Visit: Payer: Self-pay | Admitting: Physician Assistant

## 2021-12-22 ENCOUNTER — Ambulatory Visit (INDEPENDENT_AMBULATORY_CARE_PROVIDER_SITE_OTHER): Payer: Medicare HMO | Admitting: Family Medicine

## 2021-12-22 ENCOUNTER — Encounter: Payer: Self-pay | Admitting: Family Medicine

## 2021-12-22 VITALS — BP 130/84 | HR 90 | Temp 98.2°F | Wt 148.6 lb

## 2021-12-22 DIAGNOSIS — M1711 Unilateral primary osteoarthritis, right knee: Secondary | ICD-10-CM

## 2021-12-22 NOTE — Progress Notes (Signed)
? ?  Subjective:  ? ? Patient ID: Marilyn Dixon, female    DOB: 01-Jun-1951, 71 y.o.   MRN: [id number] ? ?HPI she is here for consult concerning knee surgery.  She has had continued difficulty with her knee requiring x-rays, injections and ultimately an MRI.  The MRI was reviewed by me.  It did show extensive degenerative changes.  They do have a couple of trips planned and they want my thoughts concerning when to potentially have the surgery. ? ? ? ?Review of Systems ? ?   ?Objective:  ? Physical Exam ?Alert and in no distress otherwise not examined.  The MRI was reviewed with her and her husband.  It did show arthritic changes as well as meniscal damage. ? ? ? ?   ?Assessment & Plan:  ?Arthritis of right knee ?I discussed the arthritis with her and the need for total knee replacement and timing of this in regard to planned trips.  Suggested staying on meloxicam as well as using Tylenol for pain relief and occasionally getting an injection especially before some least shows but did encourage her to have the procedure done after she completes her last trip which will be later this summer.  She and her husband were comfortable with that. ? ?

## 2021-12-23 ENCOUNTER — Telehealth: Payer: Self-pay | Admitting: Orthopaedic Surgery

## 2021-12-23 NOTE — Telephone Encounter (Signed)
Refill was called in I believe yesterday ?

## 2021-12-23 NOTE — Telephone Encounter (Signed)
Patient called. She would like a refill on her meloxicam. Please call her once called in. Her call  back number is [phone] ?

## 2021-12-23 NOTE — Telephone Encounter (Signed)
Pt found her medication --no need for a call back  ?

## 2021-12-28 ENCOUNTER — Encounter: Payer: Self-pay | Admitting: Orthopaedic Surgery

## 2021-12-28 ENCOUNTER — Ambulatory Visit: Payer: Medicare HMO | Admitting: Orthopaedic Surgery

## 2021-12-28 DIAGNOSIS — G8929 Other chronic pain: Secondary | ICD-10-CM

## 2021-12-28 DIAGNOSIS — M25561 Pain in right knee: Secondary | ICD-10-CM

## 2021-12-28 DIAGNOSIS — M1711 Unilateral primary osteoarthritis, right knee: Secondary | ICD-10-CM | POA: Insufficient documentation

## 2021-12-28 NOTE — Progress Notes (Signed)
? ?Office Visit Note ?  ?Patient: Marilyn Dixon           ?Date of Birth: 03/01/1951           ?MRN: [id number] ?Visit Date: 12/28/2021 ?             ?Requested by: Denita Lung, MD ?[address] ?[place],  [place] [address] ?PCP: Denita Lung, MD ? ? ?Assessment & Plan: ?Visit Diagnoses:  ?1. Chronic pain of right knee   ?2. Unilateral primary osteoarthritis, right knee   ? ? ?Plan: Marilyn Dixon is accompanied by her husband and here for follow-up evaluation of the chronic problem she is experiencing with her right knee.  X-rays demonstrate osteoarthritis but she has had a minimal response to cortisone injection.  Accordingly, an MRI scan was ordered demonstrating advanced osteoarthritis in all 3 compartments.  At the patellofemoral joint there was complete loss of the articular cartilage with bone-on-bone articulation and subchondral cystic changes.  Medially there was deep fissuring and focal full-thickness articular cartilage defect in the weightbearing area with subchondral edema of the medial femoral condyle and focal full-thickness cartilage defects of the lateral weightbearing area associated with subchondral edema.  There was moderate joint effusion with intra-articular loose bodies within normal Hoffa's fat pad.  No aggressive lesions.  There were advanced degenerative changes of the anterior horn of the lateral meniscus with some tearing.  Most of her pain is along the anterior aspect of her knee related to the patellofemoral joint.  Long discussion with with Marilyn Dixon and her husband regarding treatment options.  They have 2 trips planned over the next several months and I think thereafter she may want to consider knee replacement.  We will see her back a week before her trip in May for intra-articular cortisone injection in between her May and July trips we will try viscosupplementation.  She does have a brace.  She will also be working on exercises ? ?Follow-Up Instructions: Return in  about 1 month (around 01/27/2022).  ? ?Orders:  ?No orders of the defined types were placed in this encounter. ? ?No orders of the defined types were placed in this encounter. ? ? ? ? Procedures: ?No procedures performed ? ? ?Clinical Data: ?No additional findings. ? ? ?Subjective: ?Chief Complaint  ?Patient presents with  ? Right Knee - Follow-up  ?  MRI review  ?Patient presents today for follow up on her right knee. She had an MRI and is here today for those results. ? ?HPI ? ?Review of Systems ? ? ?Objective: ?Vital Signs: There were no vitals taken for this visit. ? ?Physical Exam ?Constitutional:   ?   Appearance: She is well-developed.  ?Eyes:  ?   Pupils: Pupils are equal, round, and reactive to light.  ?Pulmonary:  ?   Effort: Pulmonary effort is normal.  ?Skin: ?   General: Skin is warm and dry.  ?Neurological:  ?   Mental Status: She is alert and oriented to person, place, and time.  ?Psychiatric:     ?   Behavior: Behavior normal.  ? ? ?Ortho Exam right knee with a pullover knee support.  This was not removed.  She does have pain along the anterior aspect of her knee with some patella crepitation and some pain with compression consistent with the advanced osteoarthritis.  No instability.  No popliteal pain or calf discomfort.  Neurologically intact ? ?Specialty Comments:  ?No specialty comments available. ? ?Imaging: ?No results found. ? ? ?  PMFS History: ?Patient Active Problem List  ? Diagnosis Date Noted  ? Unilateral primary osteoarthritis, right knee 12/28/2021  ? Pain in right knee 11/15/2021  ? Osteopenia 09/21/2021  ? Hot flashes 05/10/2021  ? Status post cataract extraction 12/11/2018  ? Gastroesophageal reflux disease without esophagitis 12/07/2017  ? History of colonic polyps 02/19/2016  ? Seasonal affective disorder ([place]) 08/17/2015  ? Glaucoma 07/26/2011  ? Allergic rhinitis due to pollen 07/26/2011  ? Hyperlipidemia LDL goal <100 07/26/2011  ? ?Past Medical History:  ?Diagnosis Date  ?  Allergy   ? Back pain   ? Dyslipidemia   ? Glaucoma   ? High cholesterol   ? Hx of adenomatous colonic polyps   ? Menopause   ? Seasonal affective disorder ([place])   ?  ?History reviewed. No pertinent family history.  ?Past Surgical History:  ?Procedure Laterality Date  ? COLONOSCOPY  2006/2011  ? Schooler,Santogade  ? ?Social History  ? ?Occupational History  ? Not on file  ?Tobacco Use  ? Smoking status: Never  ? Smokeless tobacco: Never  ?Substance and Sexual Activity  ? Alcohol use: Yes  ?  Alcohol/week: 3.0 standard drinks  ?  Types: 3 drink(s) per week  ? Drug use: No  ? Sexual activity: Yes  ? ? ? ? ? ? ?

## 2022-01-07 DIAGNOSIS — H40013 Open angle with borderline findings, low risk, bilateral: Secondary | ICD-10-CM | POA: Diagnosis not present

## 2022-01-07 DIAGNOSIS — Z961 Presence of intraocular lens: Secondary | ICD-10-CM | POA: Diagnosis not present

## 2022-01-07 DIAGNOSIS — H26493 Other secondary cataract, bilateral: Secondary | ICD-10-CM | POA: Diagnosis not present

## 2022-01-07 DIAGNOSIS — H04123 Dry eye syndrome of bilateral lacrimal glands: Secondary | ICD-10-CM | POA: Diagnosis not present

## 2022-01-07 DIAGNOSIS — H35361 Drusen (degenerative) of macula, right eye: Secondary | ICD-10-CM | POA: Diagnosis not present

## 2022-01-07 DIAGNOSIS — H524 Presbyopia: Secondary | ICD-10-CM | POA: Diagnosis not present

## 2022-01-20 ENCOUNTER — Encounter: Payer: Self-pay | Admitting: Orthopaedic Surgery

## 2022-01-20 ENCOUNTER — Ambulatory Visit: Payer: Medicare HMO | Admitting: Orthopaedic Surgery

## 2022-01-20 ENCOUNTER — Telehealth: Payer: Self-pay

## 2022-01-20 DIAGNOSIS — M1711 Unilateral primary osteoarthritis, right knee: Secondary | ICD-10-CM | POA: Diagnosis not present

## 2022-01-20 MED ORDER — LIDOCAINE HCL 1 % IJ SOLN
2.0000 mL | INTRAMUSCULAR | Status: AC | PRN
  Administered 2022-01-20: 2 mL

## 2022-01-20 MED ORDER — METHYLPREDNISOLONE ACETATE 40 MG/ML IJ SUSP
80.0000 mg | INTRAMUSCULAR | Status: AC | PRN
  Administered 2022-01-20: 80 mg via INTRA_ARTICULAR

## 2022-01-20 MED ORDER — BUPIVACAINE HCL 0.25 % IJ SOLN
2.0000 mL | INTRAMUSCULAR | Status: AC | PRN
  Administered 2022-01-20: 2 mL via INTRA_ARTICULAR

## 2022-01-20 NOTE — Progress Notes (Signed)
? ?Office Visit Note ?  ?Patient: Marilyn Dixon           ?Date of Birth: 08-20-1951           ?MRN: [id number] ?Visit Date: 01/20/2022 ?             ?Requested by: Denita Lung, MD ?[address] ?[place],  [place] [address] ?PCP: Denita Lung, MD ? ? ?Assessment & Plan: ?Visit Diagnoses:  ?1. Unilateral primary osteoarthritis, right knee   ? ? ?Plan: Marilyn Dixon and her husband are planning a trip to [place] we had discussed a cortisone injection in her right knee prior to the trip.  She is here for that procedure.  We have also discussed viscosupplementation once she returns.  She has made an appointment to see Dr.Alusio for consideration of a knee replacement and depending upon that office visit and evaluation we may or may not proceed with the viscosupplementation but we will certainly asked to have it approved in case. ? ?Follow-Up Instructions: Return Preop viscosupplementation.  ? ?Orders:  ?No orders of the defined types were placed in this encounter. ? ?No orders of the defined types were placed in this encounter. ? ? ? ? Procedures: ?Large Joint Inj: R knee on 01/20/2022 1:43 PM ?Indications: pain and diagnostic evaluation ?Details: 25 G 1.5 in needle, anteromedial approach ? ?Arthrogram: No ? ?Medications: 2 mL lidocaine 1 %; 80 mg methylPREDNISolone acetate 40 MG/ML; 2 mL bupivacaine 0.25 % ?Procedure, treatment alternatives, risks and benefits explained, specific risks discussed. Consent was given by the patient. Immediately prior to procedure a time out was called to verify the correct patient, procedure, equipment, support staff and site/side marked as required. Patient was prepped and draped in the usual sterile fashion.  ? ? ? ? ?Clinical Data: ?No additional findings. ? ? ?Subjective: ?Chief Complaint  ?Patient presents with  ? Right Knee - Follow-up  ?Patient presents today for follow up her right knee. She is going on vacation and wants to have her right knee injected with cortisone  first.  ? ?HPI ? ?Review of Systems ? ? ?Objective: ?Vital Signs: There were no vitals taken for this visit. ? ?Physical Exam ?Constitutional:   ?   Appearance: She is well-developed.  ?Eyes:  ?   Pupils: Pupils are equal, round, and reactive to light.  ?Pulmonary:  ?   Effort: Pulmonary effort is normal.  ?Skin: ?   General: Skin is warm and dry.  ?Neurological:  ?   Mental Status: She is alert and oriented to person, place, and time.  ?Psychiatric:     ?   Behavior: Behavior normal.  ? ? ?Ortho Exam awake alert and oriented x3.  Comfortable sitting.  Does use a cane to aid with ambulation referable to her arthritic right knee.  The knee was not hot warm or red.  Some medial joint tenderness.  Full extension of flexion at least 100 degrees without instability.  No popliteal pain or mass ? ?Specialty Comments:  ?No specialty comments available. ? ?Imaging: ?No results found. ? ? ?PMFS History: ?Patient Active Problem List  ? Diagnosis Date Noted  ? Unilateral primary osteoarthritis, right knee 12/28/2021  ? Pain in right knee 11/15/2021  ? Osteopenia 09/21/2021  ? Hot flashes 05/10/2021  ? Status post cataract extraction 12/11/2018  ? Gastroesophageal reflux disease without esophagitis 12/07/2017  ? History of colonic polyps 02/19/2016  ? Seasonal affective disorder ([place]) 08/17/2015  ? Glaucoma 07/26/2011  ? Allergic  rhinitis due to pollen 07/26/2011  ? Hyperlipidemia LDL goal <100 07/26/2011  ? ?Past Medical History:  ?Diagnosis Date  ? Allergy   ? Back pain   ? Dyslipidemia   ? Glaucoma   ? High cholesterol   ? Hx of adenomatous colonic polyps   ? Menopause   ? Seasonal affective disorder ([place])   ?  ?History reviewed. No pertinent family history.  ?Past Surgical History:  ?Procedure Laterality Date  ? COLONOSCOPY  2006/2011  ? [employer]er,[place]  ? ?Social History  ? ?Occupational History  ? Not on file  ?Tobacco Use  ? Smoking status: Never  ? Smokeless tobacco: Never  ?Substance and Sexual Activity  ? Alcohol  use: Yes  ?  Alcohol/week: 3.0 standard drinks  ?  Types: 3 drink(s) per week  ? Drug use: No  ? Sexual activity: Yes  ? ? ? ? ? ? ?

## 2022-01-20 NOTE — Telephone Encounter (Signed)
Please precert for right knee visco. This is Dr.Whitfield's patient. ?Thanks! ?

## 2022-01-22 ENCOUNTER — Other Ambulatory Visit: Payer: Self-pay | Admitting: Family Medicine

## 2022-01-22 DIAGNOSIS — F419 Anxiety disorder, unspecified: Secondary | ICD-10-CM

## 2022-01-24 NOTE — Telephone Encounter (Signed)
[facility] requesting to fil pt klon[facility]. Please advise [place] ?

## 2022-01-25 ENCOUNTER — Encounter: Payer: Self-pay | Admitting: Family Medicine

## 2022-01-26 NOTE — Telephone Encounter (Signed)
Noted  

## 2022-01-28 ENCOUNTER — Telehealth: Payer: Self-pay

## 2022-01-28 NOTE — Telephone Encounter (Signed)
VOB submitted for Orthovisc, right knee BV pending 

## 2022-02-04 ENCOUNTER — Telehealth: Payer: Self-pay

## 2022-02-04 NOTE — Telephone Encounter (Signed)
Faxed completed PA form to [facility] at [phone] for Orthovisc, right knee. PA Pending

## 2022-02-07 ENCOUNTER — Other Ambulatory Visit: Payer: Self-pay | Admitting: Physician Assistant

## 2022-02-07 ENCOUNTER — Other Ambulatory Visit: Payer: Self-pay

## 2022-02-07 DIAGNOSIS — M1711 Unilateral primary osteoarthritis, right knee: Secondary | ICD-10-CM

## 2022-02-08 ENCOUNTER — Ambulatory Visit: Payer: Medicare HMO | Admitting: Orthopaedic Surgery

## 2022-02-08 ENCOUNTER — Encounter: Payer: Self-pay | Admitting: Orthopaedic Surgery

## 2022-02-08 DIAGNOSIS — M1711 Unilateral primary osteoarthritis, right knee: Secondary | ICD-10-CM | POA: Diagnosis not present

## 2022-02-08 MED ORDER — HYALURONAN 30 MG/2ML IX SOSY
30.0000 mg | PREFILLED_SYRINGE | INTRA_ARTICULAR | Status: AC | PRN
  Administered 2022-02-08: 30 mg via INTRA_ARTICULAR

## 2022-02-08 NOTE — Progress Notes (Signed)
   Office Visit Note   Patient: Aldean Suddeth           Date of Birth: 1951-03-05           MRN: [id number] Visit Date: 02/08/2022              Requested by: Denita Lung, MD [place],  [place] [address] PCP: Denita Lung, MD   Assessment & Plan: Visit Diagnoses:  1. Unilateral primary osteoarthritis, right knee     Plan: First Orthovisc injection right knee.  Return weekly for the next 2 weeks to complete the series of 3.  Follow-Up Instructions: Return in about 1 week (around 02/15/2022).   Orders:  No orders of the defined types were placed in this encounter.  No orders of the defined types were placed in this encounter.     Procedures: Large Joint Inj: R knee on 02/08/2022 1:38 PM Indications: pain and joint swelling Details: 25 G 1.5 in needle  Arthrogram: No  Medications: 30 mg Hyaluronan 30 MG/2ML Outcome: tolerated well, no immediate complications Procedure, treatment alternatives, risks and benefits explained, specific risks discussed. Consent was given by the patient. Immediately prior to procedure a time out was called to verify the correct patient, procedure, equipment, support staff and site/side marked as required. Patient was prepped and draped in the usual sterile fashion.      Clinical Data: No additional findings.   Subjective: Chief Complaint  Patient presents with   Right Knee - Follow-up    Orthovisc #1  Patient presents today for the first orthovisc injection into her right knee.   HPI  Review of Systems   Objective: Vital Signs: There were no vitals taken for this visit.  Physical Exam  Ortho Exam right knee was not hot red warm or swollen.  No effusion.  Minimal medial joint pain.  Full extension flex to at least 100 degrees without instability  Specialty Comments:  No specialty comments available.  Imaging: No results found.   PMFS History: Patient Active Problem List   Diagnosis Date Noted    Unilateral primary osteoarthritis, right knee 12/28/2021   Pain in right knee 11/15/2021   Osteopenia 09/21/2021   Hot flashes 05/10/2021   Status post cataract extraction 12/11/2018   Gastroesophageal reflux disease without esophagitis 12/07/2017   History of colonic polyps 02/19/2016   Seasonal affective disorder ([place]) 08/17/2015   Glaucoma 07/26/2011   Allergic rhinitis due to pollen 07/26/2011   Hyperlipidemia LDL goal <100 07/26/2011   Past Medical History:  Diagnosis Date   Allergy    Back pain    Dyslipidemia    Glaucoma    High cholesterol    Hx of adenomatous colonic polyps    Menopause    Seasonal affective disorder ([place])     History reviewed. No pertinent family history.  Past Surgical History:  Procedure Laterality Date   COLONOSCOPY  2006/2011   Schooler,[place]   Social History   Occupational History   Not on file  Tobacco Use   Smoking status: Never   Smokeless tobacco: Never  Substance and Sexual Activity   Alcohol use: Yes    Alcohol/week: 3.0 standard drinks    Types: 3 drink(s) per week   Drug use: No   Sexual activity: Yes

## 2022-02-09 DIAGNOSIS — M25561 Pain in right knee: Secondary | ICD-10-CM | POA: Diagnosis not present

## 2022-02-17 ENCOUNTER — Ambulatory Visit: Payer: Medicare HMO | Admitting: Orthopaedic Surgery

## 2022-02-17 ENCOUNTER — Encounter: Payer: Self-pay | Admitting: Orthopaedic Surgery

## 2022-02-17 DIAGNOSIS — M1711 Unilateral primary osteoarthritis, right knee: Secondary | ICD-10-CM

## 2022-02-17 MED ORDER — HYALURONAN 30 MG/2ML IX SOSY
30.0000 mg | PREFILLED_SYRINGE | INTRA_ARTICULAR | Status: AC | PRN
  Administered 2022-02-17: 30 mg via INTRA_ARTICULAR

## 2022-02-17 NOTE — Progress Notes (Signed)
   Office Visit Note   Patient: Marilyn Dixon           Date of Birth: 12/29/1950           MRN: [id number] Visit Date: 02/17/2022              Requested by: Denita Lung, MD [place],  [place] [address] PCP: Denita Lung, MD   Assessment & Plan: Visit Diagnoses:  1. Unilateral primary osteoarthritis, right knee     Plan: Patient presents today for her second Orthovisc injection into her right knee.  Tolerated the previous injection quite well no difficulties  Follow-Up Instructions: Return in about 1 week (around 02/24/2022).   Orders:  No orders of the defined types were placed in this encounter.  No orders of the defined types were placed in this encounter.     Procedures: Large Joint Inj: R knee on 02/17/2022 2:34 PM Indications: pain and diagnostic evaluation Details: 25 G 1.5 in needle, anteromedial approach  Arthrogram: No  Medications: 30 mg Hyaluronan 30 MG/2ML Outcome: tolerated well, no immediate complications Procedure, treatment alternatives, risks and benefits explained, specific risks discussed. Consent was given by the patient.     Clinical Data: No additional findings.   Subjective: Chief Complaint  Patient presents with   Right Knee - Follow-up    Orthovisc #2  Patient presents today for the second Orthovisc injection into her right knee.  HPI  Review of Systems  All other systems reviewed and are negative.   Objective: Vital Signs: There were no vitals taken for this visit.  Physical Exam  Ortho Exam Examination of her right knee no redness no effusion no swelling Specialty Comments:  No specialty comments available.  Imaging: No results found.   PMFS History: Patient Active Problem List   Diagnosis Date Noted   Unilateral primary osteoarthritis, right knee 12/28/2021   Pain in right knee 11/15/2021   Osteopenia 09/21/2021   Hot flashes 05/10/2021   Status post cataract extraction 12/11/2018    Gastroesophageal reflux disease without esophagitis 12/07/2017   History of colonic polyps 02/19/2016   Seasonal affective disorder ([place]) 08/17/2015   Glaucoma 07/26/2011   Allergic rhinitis due to pollen 07/26/2011   Hyperlipidemia LDL goal <100 07/26/2011   Past Medical History:  Diagnosis Date   Allergy    Back pain    Dyslipidemia    Glaucoma    High cholesterol    Hx of adenomatous colonic polyps    Menopause    Seasonal affective disorder ([place])     History reviewed. No pertinent family history.  Past Surgical History:  Procedure Laterality Date   COLONOSCOPY  2006/2011   Schooler,[place]   Social History   Occupational History   Not on file  Tobacco Use   Smoking status: Never   Smokeless tobacco: Never  Substance and Sexual Activity   Alcohol use: Yes    Alcohol/week: 3.0 standard drinks    Types: 3 drink(s) per week   Drug use: No   Sexual activity: Yes

## 2022-02-22 DIAGNOSIS — F411 Generalized anxiety disorder: Secondary | ICD-10-CM | POA: Diagnosis not present

## 2022-02-22 DIAGNOSIS — R030 Elevated blood-pressure reading, without diagnosis of hypertension: Secondary | ICD-10-CM | POA: Diagnosis not present

## 2022-02-22 DIAGNOSIS — Z882 Allergy status to sulfonamides status: Secondary | ICD-10-CM | POA: Diagnosis not present

## 2022-02-22 DIAGNOSIS — R69 Illness, unspecified: Secondary | ICD-10-CM | POA: Diagnosis not present

## 2022-02-22 DIAGNOSIS — Z823 Family history of stroke: Secondary | ICD-10-CM | POA: Diagnosis not present

## 2022-02-22 DIAGNOSIS — E785 Hyperlipidemia, unspecified: Secondary | ICD-10-CM | POA: Diagnosis not present

## 2022-02-22 DIAGNOSIS — Z8249 Family history of ischemic heart disease and other diseases of the circulatory system: Secondary | ICD-10-CM | POA: Diagnosis not present

## 2022-02-22 DIAGNOSIS — M1990 Unspecified osteoarthritis, unspecified site: Secondary | ICD-10-CM | POA: Diagnosis not present

## 2022-02-22 DIAGNOSIS — Z791 Long term (current) use of non-steroidal anti-inflammatories (NSAID): Secondary | ICD-10-CM | POA: Diagnosis not present

## 2022-02-24 ENCOUNTER — Ambulatory Visit: Payer: Medicare HMO | Admitting: Physician Assistant

## 2022-02-24 ENCOUNTER — Encounter: Payer: Self-pay | Admitting: Physician Assistant

## 2022-02-24 DIAGNOSIS — M1711 Unilateral primary osteoarthritis, right knee: Secondary | ICD-10-CM

## 2022-02-24 MED ORDER — HYALURONAN 30 MG/2ML IX SOSY
30.0000 mg | PREFILLED_SYRINGE | INTRA_ARTICULAR | Status: AC | PRN
  Administered 2022-02-24: 30 mg via INTRA_ARTICULAR

## 2022-02-24 NOTE — Progress Notes (Addendum)
   Procedure Note  Patient: Marilyn Dixon             Date of Birth: 08-24-1951           MRN: [id number]             Visit Date: 02/24/2022  Procedures: Visit Diagnoses:  1. Unilateral primary osteoarthritis, right knee    Patient presents today for her third Orthovisc injection into her right knee.  She has had no difficulty with previous injections. Large Joint Inj: R knee on 02/24/2022 10:55 AM Indications: pain and diagnostic evaluation Details: 25 G 1.5 in needle  Arthrogram: No  Medications: 30 mg Hyaluronan 30 MG/2ML Outcome: tolerated well, no immediate complications Procedure, treatment alternatives, risks and benefits explained, specific risks discussed. Consent was given by the patient.    Examination of her knee she has no warmth no redness no swelling.  Plan she will follow-up as needed.  She may consider a cortisone injection into the knee a week prior to her departure for [place] which is a planned trip.  She will see how she feels

## 2022-03-03 ENCOUNTER — Encounter: Payer: Self-pay | Admitting: Family Medicine

## 2022-03-10 ENCOUNTER — Other Ambulatory Visit: Payer: Self-pay | Admitting: Family Medicine

## 2022-03-10 DIAGNOSIS — E785 Hyperlipidemia, unspecified: Secondary | ICD-10-CM

## 2022-03-14 ENCOUNTER — Other Ambulatory Visit: Payer: Self-pay | Admitting: Physician Assistant

## 2022-03-18 ENCOUNTER — Other Ambulatory Visit: Payer: Self-pay | Admitting: Family Medicine

## 2022-03-18 DIAGNOSIS — F419 Anxiety disorder, unspecified: Secondary | ICD-10-CM

## 2022-03-18 DIAGNOSIS — F338 Other recurrent depressive disorders: Secondary | ICD-10-CM

## 2022-03-18 NOTE — Telephone Encounter (Signed)
[facility] to fill pt [facility]. Please advise [facility]

## 2022-04-14 ENCOUNTER — Telehealth: Payer: Self-pay | Admitting: Family Medicine

## 2022-04-14 ENCOUNTER — Other Ambulatory Visit: Payer: Self-pay | Admitting: Physician Assistant

## 2022-04-14 NOTE — Telephone Encounter (Signed)
Left message for patient to call back and schedule Medicare Annual Wellness Visit (AWV) either virtually or in office. I left my number for patient to call [phone].  Last AWV  05/10/21 ; please schedule at anytime with health coach  I wanted to see if patient could do awv with [facility] prior to appt with Provider

## 2022-04-21 DIAGNOSIS — M1711 Unilateral primary osteoarthritis, right knee: Secondary | ICD-10-CM | POA: Diagnosis not present

## 2022-05-10 ENCOUNTER — Telehealth: Payer: Self-pay | Admitting: Family Medicine

## 2022-05-10 DIAGNOSIS — Z0000 Encounter for general adult medical examination without abnormal findings: Secondary | ICD-10-CM

## 2022-05-10 DIAGNOSIS — E785 Hyperlipidemia, unspecified: Secondary | ICD-10-CM

## 2022-05-10 DIAGNOSIS — M8580 Other specified disorders of bone density and structure, unspecified site: Secondary | ICD-10-CM

## 2022-05-10 NOTE — Telephone Encounter (Signed)
Pt called in and would like to get her blood work done this Friday 05/13/22 so results can be ready for the day she has her physical.

## 2022-05-13 ENCOUNTER — Ambulatory Visit (INDEPENDENT_AMBULATORY_CARE_PROVIDER_SITE_OTHER): Payer: Medicare HMO

## 2022-05-13 ENCOUNTER — Other Ambulatory Visit: Payer: Self-pay

## 2022-05-13 VITALS — BP 160/90 | HR 93 | Temp 97.9°F | Ht 62.5 in | Wt 152.0 lb

## 2022-05-13 DIAGNOSIS — E785 Hyperlipidemia, unspecified: Secondary | ICD-10-CM

## 2022-05-13 DIAGNOSIS — M8580 Other specified disorders of bone density and structure, unspecified site: Secondary | ICD-10-CM

## 2022-05-13 DIAGNOSIS — Z0000 Encounter for general adult medical examination without abnormal findings: Secondary | ICD-10-CM

## 2022-05-13 NOTE — Progress Notes (Signed)
Subjective:   Marilyn Dixon is a 71 y.o. female who presents for Medicare Annual (Subsequent) preventive examination.  Review of Systems     Cardiac Risk Factors include: advanced age (>50mn, >>51women);dyslipidemia     Objective:    Today's Vitals   05/13/22 1320 05/13/22 1326  BP: (!) 160/90   Pulse: 93   Temp: 97.9 F (36.6 C)   TempSrc: Oral   SpO2: 98%   Weight: 152 lb (68.9 kg)   Height: 5' 2.5" (1.588 m)   PainSc:  3    Body mass index is 27.36 kg/m.     05/13/2022    1:35 PM 08/26/2021   11:59 AM 05/10/2021    1:52 PM 12/17/2019   10:06 AM 12/11/2018    9:03 AM 08/25/2016   12:13 PM 08/17/2015   10:54 AM  Advanced Directives  Does Patient Have a Medical Advance Directive? Yes Yes Yes Yes Yes Yes Yes  Type of AParamedicof [place]Living will HPueblito del RioLiving will HCatherineLiving will HElbow LakeLiving will HTiffinLiving will  HWestmorelandLiving will  Does patient want to make changes to medical advance directive?  No - Patient declined Yes (ED - Information included in AVS) No - Patient declined No - Patient declined Yes (MAU/Ambulatory/Procedural Areas - Information given)   Copy of HWareham Centerin Chart? No - copy requested No - copy requested No - copy requested No - copy requested Yes - validated most recent copy scanned in chart (See row information)  Yes    Current Medications (verified) Outpatient Encounter Medications as of 05/13/2022  Medication Sig   atorvastatin (LIPITOR) 10 MG tablet TAKE 1 TABLET BY MOUTH EVERY DAY   Black Cohosh 540 MG CAPS Take 1 capsule by mouth.   Calcium Carb-Cholecalciferol 600-800 MG-UNIT CHEW Chew 1 each by mouth daily.   cetirizine (ZYRTEC) 10 MG tablet Take 10 mg by mouth daily.   cholecalciferol (VITAMIN D) 1000 UNITS tablet Take 1,000 Units by mouth 2 (two) times daily.     clonazePAM (KLONOPIN) 0.5 MG tablet TAKE 1 TABLET BY MOUTH TWICE A DAY AS NEEDED FOR ANXIETY   meloxicam (MOBIC) 7.5 MG tablet TAKE 1 TABLET BY MOUTH EVERY DAY   Multiple Vitamin (MULTIVITAMIN WITH MINERALS) TABS Take 1 tablet by mouth daily.   sertraline (ZOLOFT) 100 MG tablet TAKE 1 TABLET BY MOUTH EVERY DAY   No facility-administered encounter medications on file as of 05/13/2022.    Allergies (verified) Sulfa antibiotics   History: Past Medical History:  Diagnosis Date   Allergy    Back pain    Dyslipidemia    Glaucoma    High cholesterol    Hx of adenomatous colonic polyps    Menopause    Seasonal affective disorder ([facility]    Past Surgical History:  Procedure Laterality Date   COLONOSCOPY  2006/2011   Schooler,[place]   History reviewed. No pertinent family history. Social History   Socioeconomic History   Marital status: Married    Spouse name: Not on file   Number of children: Not on file   Years of education: Not on file   Highest education level: Bachelor's degree (e.g., BA, AB, BS)  Occupational History   Not on file  Tobacco Use   Smoking status: Never   Smokeless tobacco: Never  Vaping Use   Vaping Use: Never used  Substance and Sexual Activity  Alcohol use: Yes    Alcohol/week: 3.0 standard drinks of alcohol    Types: 3 drink(s) per week   Drug use: No   Sexual activity: Yes  Other Topics Concern   Not on file  Social History Narrative   Not on file   Social Determinants of Health   Financial Resource Strain: Low Risk  (05/13/2022)   Overall Financial Resource Strain (CARDIA)    Difficulty of Paying Living Expenses: Not hard at all  Food Insecurity: No Food Insecurity (05/13/2022)   Hunger Vital Sign    Worried About Running Out of Food in the Last Year: Never true    Ran Out of Food in the Last Year: Never true  Transportation Needs: No Transportation Needs (05/13/2022)   PRAPARE - Hydrologist (Medical): No     Lack of Transportation (Non-Medical): No  Physical Activity: Sufficiently Active (05/13/2022)   Exercise Vital Sign    Days of Exercise per Week: 3 days    Minutes of Exercise per Session: 50 min  Stress: No Stress Concern Present (05/13/2022)   [place]    Feeling of Stress : Only a little  Social Connections: Socially Integrated (12/21/2021)   Social Connection and Isolation Panel [NHANES]    Frequency of Communication with Friends and Family: More than three times a week    Frequency of Social Gatherings with Friends and Family: More than three times a week    Attends Religious Services: More than 4 times per year    Active Member of [employer] or Organizations: Yes    Attends Music therapist: More than 4 times per year    Marital Status: Married    Tobacco Counseling Counseling given: Not Answered   Clinical Intake:  Pre-visit preparation completed: Yes  Pain : 0-10 Pain Score: 3  Pain Type: Chronic pain Pain Location: Knee Pain Orientation: Right Pain Descriptors / Indicators: Aching, Throbbing Pain Onset: More than a month ago Pain Frequency: Constant     Nutritional Status: BMI 25 -29 Overweight Nutritional Risks: None Diabetes: No  How often do you need to have someone help you when you read instructions, pamphlets, or other written materials from your doctor or pharmacy?: 1 - Never What is the last grade level you completed in school?: college  Diabetic? no  Interpreter Needed?: No  Information entered by :: NAllen LPN   Activities of Daily Living    05/13/2022    1:38 PM 05/12/2022    2:53 PM  In your present state of health, do you have any difficulty performing the following activities:  Hearing? 0 0  Vision? 0 0  Difficulty concentrating or making decisions? 0 0  Walking or climbing stairs? 1 1  Dressing or bathing? 0 0  Doing errands, shopping? 0 0  Preparing Food and  eating ? N N  Using the Toilet? N N  In the past six months, have you accidently leaked urine? N N  Do you have problems with loss of bowel control? N N  Managing your Medications? N N  Managing your Finances? N N  Housekeeping or managing your Housekeeping? N N    Patient Care Team: Denita Lung, MD as PCP - General (Family Medicine)  Indicate any recent Medical Services you may have received from other than [facility] providers in the past year (date may be approximate).     Assessment:   This is a  routine wellness examination for Marilyn Dixon.  Hearing/Vision screen Vision Screening - Comments:: Regular eye exams, [facility]  Dietary issues and exercise activities discussed: Current Exercise Habits: Home exercise routine, Type of exercise: Other - see comments (pool), Time (Minutes): 45, Frequency (Times/Week): 3, Weekly Exercise (Minutes/Week): 135   Goals Addressed             This Visit's Progress    Patient Stated       05/13/2022, wants to get knee surgery and maintain current health       Depression Screen    05/13/2022    1:37 PM 05/10/2021    1:50 PM 02/26/2021   10:25 AM 12/17/2019    9:37 AM 12/11/2018    8:40 AM 12/07/2017   11:04 AM 08/25/2016   10:10 AM  PHQ 2/9 Scores  PHQ - 2 Score 0 0 0 1 0 0 2  PHQ- 9 Score 0          Fall Risk    05/13/2022    1:36 PM 05/12/2022    2:53 PM 12/22/2021   10:56 AM 05/10/2021    1:49 PM 02/17/2021    8:57 AM  Fall Risk   Falls in the past year? '1 1 1 1 1  '$ Comment tripped      Number falls in past yr: 0 0 0 0 0  Injury with Fall? 0 0 0 1 1  Comment    arthritis flare up   Risk for fall due to : Medication side effect;Impaired mobility  No Fall Risks Other (Comment) Other (Comment)  Follow up Falls evaluation completed;Education provided;Falls prevention discussed  Falls evaluation completed Falls evaluation completed Falls evaluation completed    FALL RISK PREVENTION PERTAINING TO THE HOME:  Any stairs in or around  the home? Yes  If so, are there any without handrails? Yes  Home free of loose throw rugs in walkways, pet beds, electrical cords, etc? Yes  Adequate lighting in your home to reduce risk of falls? Yes   ASSISTIVE DEVICES UTILIZED TO PREVENT FALLS:  Life alert? No  Use of a cane, walker or w/c? Yes  Grab bars in the bathroom? Yes  Shower chair or bench in shower? Yes  Elevated toilet seat or a handicapped toilet? No   TIMED UP AND GO:  Was the test performed? No .    Gait slow and steady without use of assistive device  Cognitive Function:        05/13/2022    1:39 PM  6CIT Screen  What Year? 0 points  What month? 0 points  What time? 0 points  Count back from 20 0 points  Months in reverse 0 points  Repeat phrase 0 points  Total Score 0 points    Immunizations Immunization History  Administered Date(s) Administered   DT (Pediatric) 06/27/1997   Fluad Quad(high Dose 65+) 06/14/2019, 07/17/2020, 07/14/2021   Hepatitis A 06/19/2007, 07/22/2010   Influenza Split 09/23/1999, 07/20/2012, 06/20/2013   Influenza Whole 09/24/2002, 07/17/2009, 05/09/2010   Influenza, High Dose Seasonal PF 06/22/2015, 07/04/2016, 06/07/2017, 08/03/2018   Influenza,inj,Quad PF,6+ Mos 05/20/2014   Influenza-Unspecified 08/03/2018   PFIZER(Purple Top)SARS-COV-2 Vaccination 10/26/2019, 11/20/2019, 06/19/2020, 03/23/2021   Pfizer Covid-19 Vaccine Bivalent Booster 73yr & up 08/09/2021   Pneumococcal Conjugate-13 07/29/2013   Pneumococcal Polysaccharide-23 05/11/2005   Tdap 06/19/2007, 01/14/2011, 10/29/2021   Zoster Recombinat (Shingrix) 07/20/2017, 12/19/2017   Zoster, Live 07/22/2010    TDAP status: Up to date  Flu Vaccine status:  Due, Education has been provided regarding the importance of this vaccine. Advised may receive this vaccine at local pharmacy or Health Dept. Aware to provide a copy of the vaccination record if obtained from local pharmacy or Health Dept. Verbalized acceptance  and understanding.  Pneumococcal vaccine status: Up to date  Covid-19 vaccine status: Completed vaccines  Qualifies for Shingles Vaccine? Yes   Zostavax completed Yes   Shingrix Completed?: Yes  Screening Tests Health Maintenance  Topic Date Due   COVID-19 Vaccine (6 - Pfizer series) 12/07/2021   INFLUENZA VACCINE  04/19/2022   MAMMOGRAM  08/21/2023   COLONOSCOPY (Pts 45-1yr Insurance coverage will need to be confirmed)  01/09/2031   TETANUS/TDAP  10/30/2031   DEXA SCAN  Completed   Hepatitis C Screening  Completed   Zoster Vaccines- Shingrix  Completed   HPV VACCINES  Aged Out   Pneumonia Vaccine 71 Years old  DBrooksMaintenance Due  Topic Date Due   COVID-19 Vaccine (6 - Pfizer series) 12/07/2021   INFLUENZA VACCINE  04/19/2022    Colorectal cancer screening: Type of screening: Colonoscopy. Completed 01/08/2021. Repeat every 5 years  Mammogram status: Completed 08/20/2021. Repeat every year  Bone Density status: Completed 09/21/2021.   Lung Cancer Screening: (Low Dose CT Chest recommended if Age 72-80years, 30 pack-year currently smoking OR have quit w/in 15years.) does not qualify.   Lung Cancer Screening Referral: no  Additional Screening:  Hepatitis C Screening: does not qualify; Completed 08/17/2015  Vision Screening: Recommended annual ophthalmology exams for early detection of glaucoma and other disorders of the eye. Is the patient up to date with their annual eye exam?  Yes  Who is the provider or what is the name of the office in which the patient attends annual eye exams? [facility] pt is not established with a provider, would they like to be referred to a provider to establish care? No .   Dental Screening: Recommended annual dental exams for proper oral hygiene  Community Resource Referral / Chronic Care Management: CRR required this visit?  No   CCM required this visit?  No      Plan:     I have  personally reviewed and noted the following in the patient's chart:   Medical and social history Use of alcohol, tobacco or illicit drugs  Current medications and supplements including opioid prescriptions. Patient is not currently taking opioid prescriptions. Functional ability and status Nutritional status Physical activity Advanced directives List of other physicians Hospitalizations, surgeries, and ER visits in previous 12 months Vitals Screenings to include cognitive, depression, and falls Referrals and appointments  In addition, I have reviewed and discussed with patient certain preventive protocols, quality metrics, and best practice recommendations. A written personalized care plan for preventive services as well as general preventive health recommendations were provided to patient.     NKellie Simmering LPN   89/21/1942  Nurse Notes: none

## 2022-05-13 NOTE — Patient Instructions (Signed)
Marilyn Dixon , Thank you for taking time to come for your Medicare Wellness Visit. I appreciate your ongoing commitment to your health goals. Please review the following plan we discussed and let me know if I can assist you in the future.   Screening recommendations/referrals: Colonoscopy: completed 01/08/2021, 01/08/2026 Mammogram: completed 08/20/2021, due 08/21/2022 Bone Density: completed 09/21/2021 Recommended yearly ophthalmology/optometry visit for glaucoma screening and checkup Recommended yearly dental visit for hygiene and checkup  Vaccinations: Influenza vaccine: due Pneumococcal vaccine: completed 07/29/2013 Tdap vaccine: completed 10/29/2021, due 10/30/2031 Shingles vaccine: completed   Covid-19: 08/09/2021, 03/23/2021, 06/19/2020, 11/20/2019, 10/26/2019  Advanced directives: Please bring a copy of your POA (Power of Attorney) and/or Living Will to your next appointment.    Conditions/risks identified: none  Next appointment: Follow up in one year for your annual wellness visit    Preventive Care 65 Years and Older, Female Preventive care refers to lifestyle choices and visits with your health care provider that can promote health and wellness. What does preventive care include? A yearly physical exam. This is also called an annual well check. Dental exams once or twice a year. Routine eye exams. Ask your health care provider how often you should have your eyes checked. Personal lifestyle choices, including: Daily care of your teeth and gums. Regular physical activity. Eating a healthy diet. Avoiding tobacco and drug use. Limiting alcohol use. Practicing safe sex. Taking low-dose aspirin every day. Taking vitamin and mineral supplements as recommended by your health care provider. What happens during an annual well check? The services and screenings done by your health care provider during your annual well check will depend on your age, overall health, lifestyle risk factors, and  family history of disease. Counseling  Your health care provider may ask you questions about your: Alcohol use. Tobacco use. Drug use. Emotional well-being. Home and relationship well-being. Sexual activity. Eating habits. History of falls. Memory and ability to understand (cognition). Work and work Statistician. Reproductive health. Screening  You may have the following tests or measurements: Height, weight, and BMI. Blood pressure. Lipid and cholesterol levels. These may be checked every 5 years, or more frequently if you are over 54 years old. Skin check. Lung cancer screening. You may have this screening every year starting at age 38 if you have a 30-pack-year history of smoking and currently smoke or have quit within the past 15 years. Fecal occult blood test (FOBT) of the stool. You may have this test every year starting at age 26. Flexible sigmoidoscopy or colonoscopy. You may have a sigmoidoscopy every 5 years or a colonoscopy every 10 years starting at age 49. Hepatitis C blood test. Hepatitis B blood test. Sexually transmitted disease (STD) testing. Diabetes screening. This is done by checking your blood sugar (glucose) after you have not eaten for a while (fasting). You may have this done every 1-3 years. Bone density scan. This is done to screen for osteoporosis. You may have this done starting at age 39. Mammogram. This may be done every 1-2 years. Talk to your health care provider about how often you should have regular mammograms. Talk with your health care provider about your test results, treatment options, and if necessary, the need for more tests. Vaccines  Your health care provider may recommend certain vaccines, such as: Influenza vaccine. This is recommended every year. Tetanus, diphtheria, and acellular pertussis (Tdap, Td) vaccine. You may need a Td booster every 10 years. Zoster vaccine. You may need this after age 76. Pneumococcal 13-valent  conjugate  (PCV13) vaccine. One dose is recommended after age 44. Pneumococcal polysaccharide (PPSV23) vaccine. One dose is recommended after age 81. Talk to your health care provider about which screenings and vaccines you need and how often you need them. This information is not intended to replace advice given to you by your health care provider. Make sure you discuss any questions you have with your health care provider. Document Released: 10/02/2015 Document Revised: 05/25/2016 Document Reviewed: 07/07/2015 Elsevier Interactive Patient Education  2017 [place] Prevention in the Home Falls can cause injuries. They can happen to people of all ages. There are many things you can do to make your home safe and to help prevent falls. What can I do on the outside of my home? Regularly fix the edges of walkways and driveways and fix any cracks. Remove anything that might make you trip as you walk through a door, such as a raised step or threshold. Trim any bushes or trees on the path to your home. Use bright outdoor lighting. Clear any walking paths of anything that might make someone trip, such as rocks or tools. Regularly check to see if handrails are loose or broken. Make sure that both sides of any steps have handrails. Any raised decks and porches should have guardrails on the edges. Have any leaves, snow, or ice cleared regularly. Use sand or salt on walking paths during winter. Clean up any spills in your garage right away. This includes oil or grease spills. What can I do in the bathroom? Use night lights. Install grab bars by the toilet and in the tub and shower. Do not use towel bars as grab bars. Use non-skid mats or decals in the tub or shower. If you need to sit down in the shower, use a plastic, non-slip stool. Keep the floor dry. Clean up any water that spills on the floor as soon as it happens. Remove soap buildup in the tub or shower regularly. Attach bath mats securely with  double-sided non-slip rug tape. Do not have throw rugs and other things on the floor that can make you trip. What can I do in the bedroom? Use night lights. Make sure that you have a light by your bed that is easy to reach. Do not use any sheets or blankets that are too big for your bed. They should not hang down onto the floor. Have a firm chair that has side arms. You can use this for support while you get dressed. Do not have throw rugs and other things on the floor that can make you trip. What can I do in the kitchen? Clean up any spills right away. Avoid walking on wet floors. Keep items that you use a lot in easy-to-reach places. If you need to reach something above you, use a strong step stool that has a grab bar. Keep electrical cords out of the way. Do not use floor polish or wax that makes floors slippery. If you must use wax, use non-skid floor wax. Do not have throw rugs and other things on the floor that can make you trip. What can I do with my stairs? Do not leave any items on the stairs. Make sure that there are handrails on both sides of the stairs and use them. Fix handrails that are broken or loose. Make sure that handrails are as long as the stairways. Check any carpeting to make sure that it is firmly attached to the stairs. Fix any carpet that  is loose or worn. Avoid having throw rugs at the top or bottom of the stairs. If you do have throw rugs, attach them to the floor with carpet tape. Make sure that you have a light switch at the top of the stairs and the bottom of the stairs. If you do not have them, ask someone to add them for you. What else can I do to help prevent falls? Wear shoes that: Do not have high heels. Have rubber bottoms. Are comfortable and fit you well. Are closed at the toe. Do not wear sandals. If you use a stepladder: Make sure that it is fully opened. Do not climb a closed stepladder. Make sure that both sides of the stepladder are locked  into place. Ask someone to hold it for you, if possible. Clearly mark and make sure that you can see: Any grab bars or handrails. First and last steps. Where the edge of each step is. Use tools that help you move around (mobility aids) if they are needed. These include: Canes. Walkers. Scooters. Crutches. Turn on the lights when you go into a dark area. Replace any light bulbs as soon as they burn out. Set up your furniture so you have a clear path. Avoid moving your furniture around. If any of your floors are uneven, fix them. If there are any pets around you, be aware of where they are. Review your medicines with your doctor. Some medicines can make you feel dizzy. This can increase your chance of falling. Ask your doctor what other things that you can do to help prevent falls. This information is not intended to replace advice given to you by your health care provider. Make sure you discuss any questions you have with your health care provider. Document Released: 07/02/2009 Document Revised: 02/11/2016 Document Reviewed: 10/10/2014 Elsevier Interactive Patient Education  2017 [employer].

## 2022-05-14 LAB — CBC WITH DIFFERENTIAL/PLATELET
Basophils Absolute: 0 10*3/uL (ref 0.0–0.2)
Basos: 0 %
EOS (ABSOLUTE): 0.1 10*3/uL (ref 0.0–0.4)
Eos: 1 %
Hematocrit: 46.2 % (ref 34.0–46.6)
Hemoglobin: 15 g/dL (ref 11.1–15.9)
Immature Grans (Abs): 0 10*3/uL (ref 0.0–0.1)
Immature Granulocytes: 0 %
Lymphocytes Absolute: 1.6 10*3/uL (ref 0.7–3.1)
Lymphs: 24 %
MCH: 29.5 pg (ref 26.6–33.0)
MCHC: 32.5 g/dL (ref 31.5–35.7)
MCV: 91 fL (ref 79–97)
Monocytes Absolute: 0.4 10*3/uL (ref 0.1–0.9)
Monocytes: 6 %
Neutrophils Absolute: 4.6 10*3/uL (ref 1.4–7.0)
Neutrophils: 69 %
Platelets: 262 10*3/uL (ref 150–450)
RBC: 5.08 x10E6/uL (ref 3.77–5.28)
RDW: 12.1 % (ref 11.7–15.4)
WBC: 6.7 10*3/uL (ref 3.4–10.8)

## 2022-05-14 LAB — COMPREHENSIVE METABOLIC PANEL
ALT: 18 IU/L (ref 0–32)
AST: 21 IU/L (ref 0–40)
Albumin/Globulin Ratio: 1.9 (ref 1.2–2.2)
Albumin: 4.4 g/dL (ref 3.8–4.8)
Alkaline Phosphatase: 146 IU/L — ABNORMAL HIGH (ref 44–121)
BUN/Creatinine Ratio: 14 (ref 12–28)
BUN: 12 mg/dL (ref 8–27)
Bilirubin Total: 0.4 mg/dL (ref 0.0–1.2)
CO2: 22 mmol/L (ref 20–29)
Calcium: 9.8 mg/dL (ref 8.7–10.3)
Chloride: 106 mmol/L (ref 96–106)
Creatinine, Ser: 0.85 mg/dL (ref 0.57–1.00)
Globulin, Total: 2.3 g/dL (ref 1.5–4.5)
Glucose: 85 mg/dL (ref 70–99)
Potassium: 4.7 mmol/L (ref 3.5–5.2)
Sodium: 143 mmol/L (ref 134–144)
Total Protein: 6.7 g/dL (ref 6.0–8.5)
eGFR: 73 mL/min/{1.73_m2} (ref >59)

## 2022-05-14 LAB — LIPID PANEL
Chol/HDL Ratio: 2.9 ratio (ref 0.0–4.4)
Cholesterol, Total: 209 mg/dL — ABNORMAL HIGH (ref 100–199)
HDL: 73 mg/dL (ref >39)
LDL Chol Calc (NIH): 119 mg/dL — ABNORMAL HIGH (ref 0–99)
Triglycerides: 94 mg/dL (ref 0–149)
VLDL Cholesterol Cal: 17 mg/dL (ref 5–40)

## 2022-05-17 ENCOUNTER — Ambulatory Visit (INDEPENDENT_AMBULATORY_CARE_PROVIDER_SITE_OTHER): Payer: Medicare HMO | Admitting: Family Medicine

## 2022-05-17 ENCOUNTER — Encounter: Payer: Self-pay | Admitting: Family Medicine

## 2022-05-17 VITALS — BP 132/80 | HR 88 | Temp 98.6°F | Ht 63.0 in | Wt 153.4 lb

## 2022-05-17 DIAGNOSIS — R232 Flushing: Secondary | ICD-10-CM | POA: Diagnosis not present

## 2022-05-17 DIAGNOSIS — Z86010 Personal history of colonic polyps: Secondary | ICD-10-CM | POA: Diagnosis not present

## 2022-05-17 DIAGNOSIS — K219 Gastro-esophageal reflux disease without esophagitis: Secondary | ICD-10-CM

## 2022-05-17 DIAGNOSIS — R69 Illness, unspecified: Secondary | ICD-10-CM | POA: Diagnosis not present

## 2022-05-17 DIAGNOSIS — Z9849 Cataract extraction status, unspecified eye: Secondary | ICD-10-CM

## 2022-05-17 DIAGNOSIS — F338 Other recurrent depressive disorders: Secondary | ICD-10-CM

## 2022-05-17 DIAGNOSIS — Z0000 Encounter for general adult medical examination without abnormal findings: Secondary | ICD-10-CM

## 2022-05-17 DIAGNOSIS — F419 Anxiety disorder, unspecified: Secondary | ICD-10-CM

## 2022-05-17 DIAGNOSIS — M8580 Other specified disorders of bone density and structure, unspecified site: Secondary | ICD-10-CM | POA: Diagnosis not present

## 2022-05-17 DIAGNOSIS — H409 Unspecified glaucoma: Secondary | ICD-10-CM

## 2022-05-17 DIAGNOSIS — E785 Hyperlipidemia, unspecified: Secondary | ICD-10-CM | POA: Diagnosis not present

## 2022-05-17 DIAGNOSIS — J301 Allergic rhinitis due to pollen: Secondary | ICD-10-CM

## 2022-05-17 MED ORDER — SERTRALINE HCL 100 MG PO TABS: 100.0000 mg | ORAL_TABLET | Freq: Every day | ORAL | 3 refills | Status: DC

## 2022-05-17 MED ORDER — CLONAZEPAM 0.5 MG PO TABS: ORAL_TABLET | ORAL | 0 refills | Status: DC

## 2022-05-17 MED ORDER — ATORVASTATIN CALCIUM 10 MG PO TABS: 10.0000 mg | ORAL_TABLET | Freq: Every day | ORAL | 3 refills | Status: DC

## 2022-05-17 NOTE — Progress Notes (Signed)
Complete physical exam  Patient: Marilyn Dixon   DOB: [birth year]   70 y.o. Female  MRN: [id number]  Subjective:    Marilyn Dixon is a 71 y.o. female who presents today for a complete physical exam. She reports consuming a general, low fat, and low sodium diet. Gym/ health club routine includes cardio, light weights, and low impact aerobics. She generally feels well. She reports sleeping well.  She does have a history of colonic polyps and is scheduled for follow-up in 2027.  Her allergies seem to be under good control.  Recent blood work did show elevated alkaline phosphatase however review of the record indicates that this has been so for several years.  She continues on Zoloft and occasional use of Klonopin.  Continues on atorvastatin.  She sees her ophthalmologist regularly.  Reflux seems to be under good control.  She does occasionally have difficulty with hot but not of major concern.  Does have recent x-ray evidence of osteopenia and is taking vitamin C and calcium.  She has had cataract surgery.  Most recent fall risk assessment:    05/13/2022    1:36 PM  [place] in the past year? 1  Comment tripped  Number falls in past yr: 0  Injury with Fall? 0  Risk for fall due to : Medication side effect;Impaired mobility  Follow up Falls evaluation completed;Education provided;Falls prevention discussed     Most recent depression screenings:    05/13/2022    1:37 PM 05/10/2021    1:50 PM  PHQ 2/9 Scores  PHQ - 2 Score 0 0  PHQ- 9 Score 0       Patient Active Problem List   Diagnosis Date Noted   Unilateral primary osteoarthritis, right knee 12/28/2021   Pain in right knee 11/15/2021   Osteopenia 09/21/2021   Hot flashes 05/10/2021   Status post cataract extraction 12/11/2018   Gastroesophageal reflux disease without esophagitis 12/07/2017   History of colonic polyps 02/19/2016   Seasonal affective disorder ([place]) 08/17/2015   Glaucoma 07/26/2011   Allergic  rhinitis due to pollen 07/26/2011   Hyperlipidemia LDL goal <100 07/26/2011   Past Medical History:  Diagnosis Date   Allergy    Back pain    Dyslipidemia    Glaucoma    High cholesterol    Hx of adenomatous colonic polyps    Menopause    Seasonal affective disorder [facility])    Past Surgical History:  Procedure Laterality Date   COLONOSCOPY  2006/2011   Schooler,[place]   Social History   Tobacco Use   Smoking status: Never   Smokeless tobacco: Never  Vaping Use   Vaping Use: Never used  Substance Use Topics   Alcohol use: Yes    Alcohol/week: 3.0 standard drinks of alcohol    Types: 3 drink(s) per week   Drug use: No   No family history on file. Allergies  Allergen Reactions   Sulfa Antibiotics     rash      Patient Care Team: Denita Lung, MD as PCP - General (Family Medicine)   Outpatient Medications Prior to Visit  Medication Sig   atorvastatin (LIPITOR) 10 MG tablet TAKE 1 TABLET BY MOUTH EVERY DAY   Black Cohosh 540 MG CAPS Take 1 capsule by mouth.   Calcium Carb-Cholecalciferol 600-800 MG-UNIT CHEW Chew 1 each by mouth daily.   cetirizine (ZYRTEC) 10 MG tablet Take 10 mg by mouth daily.   cholecalciferol (VITAMIN D)  1000 UNITS tablet Take 1,000 Units by mouth 2 (two) times daily.    clonazePAM (KLONOPIN) 0.5 MG tablet TAKE 1 TABLET BY MOUTH TWICE A DAY AS NEEDED FOR ANXIETY   meloxicam (MOBIC) 7.5 MG tablet TAKE 1 TABLET BY MOUTH EVERY DAY   Multiple Vitamin (MULTIVITAMIN WITH MINERALS) TABS Take 1 tablet by mouth daily.   sertraline (ZOLOFT) 100 MG tablet TAKE 1 TABLET BY MOUTH EVERY DAY   No facility-administered medications prior to visit.    ROS        Objective:     There were no vitals taken for this visit. BP Readings from Last 3 Encounters:  05/13/22 (!) 160/90  12/22/21 130/84  11/08/21 120/70   Wt Readings from Last 3 Encounters:  05/13/22 152 lb (68.9 kg)  12/22/21 148 lb 9.6 oz (67.4 kg)  11/15/21 148 lb (67.1 kg)       Physical Exam   No results found for any visits on 05/17/22. Last CBC Lab Results  Component Value Date   WBC 6.7 05/13/2022   HGB 15.0 05/13/2022   HCT 46.2 05/13/2022   MCV 91 05/13/2022   MCH 29.5 05/13/2022   RDW 12.1 05/13/2022   PLT 262 11/91/4782   Last metabolic panel Lab Results  Component Value Date   GLUCOSE 85 05/13/2022   NA 143 05/13/2022   K 4.7 05/13/2022   CL 106 05/13/2022   CO2 22 05/13/2022   BUN 12 05/13/2022   CREATININE 0.85 05/13/2022   EGFR 73 05/13/2022   CALCIUM 9.8 05/13/2022   PROT 6.7 05/13/2022   ALBUMIN 4.4 05/13/2022   LABGLOB 2.3 05/13/2022   AGRATIO 1.9 05/13/2022   BILITOT 0.4 05/13/2022   ALKPHOS 146 (H) 05/13/2022   AST 21 05/13/2022   ALT 18 05/13/2022   Last lipids Lab Results  Component Value Date   CHOL 209 (H) 05/13/2022   HDL 73 05/13/2022   LDLCALC 119 (H) 05/13/2022   TRIG 94 05/13/2022   CHOLHDL 2.9 05/13/2022   Last vitamin D Lab Results  Component Value Date   VD25OH 47 08/25/2016        Assessment & Plan:    Routine Health Maintenance and Physical Exam  Immunization History  Administered Date(s) Administered   DT (Pediatric) 06/27/1997   Fluad Quad(high Dose 65+) 06/14/2019, 07/17/2020, 07/14/2021   Hepatitis A 06/19/2007, 07/22/2010   Influenza Split 09/23/1999, 07/20/2012, 06/20/2013   Influenza Whole 09/24/2002, 07/17/2009, 05/09/2010   Influenza, High Dose Seasonal PF 06/22/2015, 07/04/2016, 06/07/2017, 08/03/2018   Influenza,inj,Quad PF,6+ Mos 05/20/2014   Influenza-Unspecified 08/03/2018   PFIZER(Purple Top)SARS-COV-2 Vaccination 10/26/2019, 11/20/2019, 06/19/2020, 03/23/2021   Pfizer Covid-19 Vaccine Bivalent Booster 44yr & up 08/09/2021   Pneumococcal Conjugate-13 07/29/2013   Pneumococcal Polysaccharide-23 05/11/2005   Tdap 06/19/2007, 01/14/2011, 10/29/2021   Zoster Recombinat (Shingrix) 07/20/2017, 12/19/2017   Zoster, Live 07/22/2010    Health Maintenance  Topic Date Due    COVID-19 Vaccine (6 - Pfizer series) 12/07/2021   INFLUENZA VACCINE  04/19/2022   MAMMOGRAM  08/21/2023   COLONOSCOPY (Pts 45-421yrInsurance coverage will need to be confirmed)  01/09/2031   TETANUS/TDAP  10/30/2031   DEXA SCAN  Completed   Hepatitis C Screening  Completed   Zoster Vaccines- Shingrix  Completed   HPV VACCINES  Aged Out   Pneumonia Vaccine 6527Years old  Discontinued    Discussed health benefits of physical activity, and encouraged her to engage in regular exercise appropriate for her age and condition.  Problem  List Items Addressed This Visit   None  No follow-ups on file.     Elyse Jarvis, RMA

## 2022-05-17 NOTE — Patient Instructions (Signed)

## 2022-05-25 ENCOUNTER — Encounter: Payer: Self-pay | Admitting: Internal Medicine

## 2022-06-08 ENCOUNTER — Other Ambulatory Visit (INDEPENDENT_AMBULATORY_CARE_PROVIDER_SITE_OTHER): Payer: Medicare HMO

## 2022-06-08 DIAGNOSIS — Z23 Encounter for immunization: Secondary | ICD-10-CM | POA: Diagnosis not present

## 2022-06-13 ENCOUNTER — Ambulatory Visit (INDEPENDENT_AMBULATORY_CARE_PROVIDER_SITE_OTHER): Payer: Medicare HMO | Admitting: Family Medicine

## 2022-06-13 ENCOUNTER — Encounter: Payer: Self-pay | Admitting: Family Medicine

## 2022-06-13 VITALS — BP 130/82 | HR 82 | Temp 98.7°F | Wt 150.8 lb

## 2022-06-13 DIAGNOSIS — M7711 Lateral epicondylitis, right elbow: Secondary | ICD-10-CM

## 2022-06-13 DIAGNOSIS — R300 Dysuria: Secondary | ICD-10-CM

## 2022-06-13 DIAGNOSIS — N3001 Acute cystitis with hematuria: Secondary | ICD-10-CM

## 2022-06-13 LAB — POCT URINALYSIS DIP (CLINITEK)
Bilirubin, UA: NEGATIVE
Glucose, UA: NEGATIVE mg/dL
Nitrite, UA: POSITIVE — AB
POC PROTEIN,UA: 100 — AB
Spec Grav, UA: 1.015 (ref 1.010–1.025)
Urobilinogen, UA: 0.2 E.U./dL
pH, UA: 6.5 (ref 5.0–8.0)

## 2022-06-13 MED ORDER — DOXYCYCLINE HYCLATE 100 MG PO TABS: 100.0000 mg | ORAL_TABLET | Freq: Two times a day (BID) | ORAL | 0 refills | Status: DC

## 2022-06-13 NOTE — Patient Instructions (Signed)
Azo Standard to help with the bladder symptoms

## 2022-06-13 NOTE — Progress Notes (Signed)
   Subjective:    Patient ID: Marilyn Dixon, female    DOB: February 17, 1951, 71 y.o.   MRN: [id number]  HPI She states that she was awakened last night with urinary frequency and dysuria.  She also complains of difficulty with right forearm pain similar to what she had in the past.  She was given some physical therapy maneuvers to use on it which she has started to do.   Review of Systems     Objective:   Physical Exam Alert and in no distress.  Tender to palpation over the lateral epicondyle.  With good motion of the elbow.       Assessment & Plan:  Burning with urination - Plan: POCT URINALYSIS DIP (CLINITEK)  Acute cystitis with hematuria - Plan: doxycycline (VIBRA-TABS) 100 MG tablet  Lateral epicondylitis of right elbow She will need to return here in 2 weeks to recheck her urine. I then explained that I thought she should go ahead and continue with the physical therapy modalities as if she has further difficulty she will call me concerning this.

## 2022-06-14 ENCOUNTER — Ambulatory Visit: Payer: Medicare HMO | Admitting: Family Medicine

## 2022-06-27 ENCOUNTER — Ambulatory Visit: Payer: Medicare HMO | Admitting: Family Medicine

## 2022-06-27 ENCOUNTER — Encounter: Payer: Self-pay | Admitting: Family Medicine

## 2022-06-27 ENCOUNTER — Ambulatory Visit (INDEPENDENT_AMBULATORY_CARE_PROVIDER_SITE_OTHER): Payer: Medicare HMO | Admitting: Family Medicine

## 2022-06-27 VITALS — BP 154/92 | HR 85 | Temp 98.1°F | Wt 151.4 lb

## 2022-06-27 DIAGNOSIS — N3001 Acute cystitis with hematuria: Secondary | ICD-10-CM

## 2022-06-27 LAB — POCT URINALYSIS DIP (PROADVANTAGE DEVICE)
Bilirubin, UA: NEGATIVE
Blood, UA: NEGATIVE
Glucose, UA: NEGATIVE mg/dL
Ketones, POC UA: NEGATIVE mg/dL
Nitrite, UA: NEGATIVE
Protein Ur, POC: NEGATIVE mg/dL
Specific Gravity, Urine: 1.01
Urobilinogen, Ur: 0.2
pH, UA: 7 (ref 5.0–8.0)

## 2022-06-27 NOTE — Progress Notes (Signed)
   Subjective:    Patient ID: Marilyn Dixon, female    DOB: November 23, 1950, 71 y.o.   MRN: [id number]  HPI She is here for recheck on recent UTI.  Presently she is having no difficulty.   Review of Systems     Objective:   Physical Exam Alert and in no distress.  Urine dipstick was normal.       Assessment & Plan:  Acute cystitis with hematuria - Plan: POCT Urinalysis DIP (Proadvantage Device) She did have questions concerning possibly foods causing UTIs and I explained that I have not ever seen a credible evidence of that occurring.

## 2022-06-28 ENCOUNTER — Encounter: Payer: Self-pay | Admitting: Internal Medicine

## 2022-07-11 ENCOUNTER — Encounter: Payer: Self-pay | Admitting: Internal Medicine

## 2022-07-12 DIAGNOSIS — Z872 Personal history of diseases of the skin and subcutaneous tissue: Secondary | ICD-10-CM | POA: Diagnosis not present

## 2022-07-12 DIAGNOSIS — L821 Other seborrheic keratosis: Secondary | ICD-10-CM | POA: Diagnosis not present

## 2022-07-12 DIAGNOSIS — D225 Melanocytic nevi of trunk: Secondary | ICD-10-CM | POA: Diagnosis not present

## 2022-07-12 DIAGNOSIS — L814 Other melanin hyperpigmentation: Secondary | ICD-10-CM | POA: Diagnosis not present

## 2022-07-12 DIAGNOSIS — L7211 Pilar cyst: Secondary | ICD-10-CM | POA: Diagnosis not present

## 2022-07-21 ENCOUNTER — Other Ambulatory Visit (INDEPENDENT_AMBULATORY_CARE_PROVIDER_SITE_OTHER): Payer: Medicare HMO

## 2022-07-21 DIAGNOSIS — Z23 Encounter for immunization: Secondary | ICD-10-CM | POA: Diagnosis not present

## 2022-08-10 ENCOUNTER — Other Ambulatory Visit: Payer: Self-pay | Admitting: Family Medicine

## 2022-08-10 DIAGNOSIS — Z1231 Encounter for screening mammogram for malignant neoplasm of breast: Secondary | ICD-10-CM

## 2022-09-01 DIAGNOSIS — M1711 Unilateral primary osteoarthritis, right knee: Secondary | ICD-10-CM | POA: Diagnosis not present

## 2022-10-07 ENCOUNTER — Ambulatory Visit
Admission: RE | Admit: 2022-10-07 | Discharge: 2022-10-07 | Disposition: A | Discharge status: Home / Self Care | Payer: Medicare HMO | Source: Ambulatory Visit | Attending: Family Medicine | Admitting: Family Medicine

## 2022-10-07 DIAGNOSIS — Z1231 Encounter for screening mammogram for malignant neoplasm of breast: Secondary | ICD-10-CM | POA: Diagnosis not present

## 2022-10-07 DIAGNOSIS — H109 Unspecified conjunctivitis: Secondary | ICD-10-CM | POA: Diagnosis not present

## 2022-10-14 DIAGNOSIS — H109 Unspecified conjunctivitis: Secondary | ICD-10-CM | POA: Diagnosis not present

## 2022-12-29 DIAGNOSIS — M25561 Pain in right knee: Secondary | ICD-10-CM | POA: Diagnosis not present

## 2023-01-04 ENCOUNTER — Telehealth: Payer: Self-pay | Admitting: Family Medicine

## 2023-01-04 NOTE — Telephone Encounter (Signed)
Called and left VM to schedule a appt for a surgical clearance form per dr Susann Givens, form in brown folder

## 2023-01-11 ENCOUNTER — Encounter: Payer: Self-pay | Admitting: Family Medicine

## 2023-01-11 ENCOUNTER — Ambulatory Visit (INDEPENDENT_AMBULATORY_CARE_PROVIDER_SITE_OTHER): Payer: Medicare HMO | Admitting: Family Medicine

## 2023-01-11 VITALS — BP 136/88 | HR 79 | Temp 97.5°F | Resp 18 | Wt 155.4 lb

## 2023-01-11 DIAGNOSIS — J301 Allergic rhinitis due to pollen: Secondary | ICD-10-CM

## 2023-01-11 DIAGNOSIS — Z01818 Encounter for other preprocedural examination: Secondary | ICD-10-CM

## 2023-01-11 NOTE — Progress Notes (Signed)
   Subjective:    Patient ID: Marilyn Dixon, female    DOB: Jan 19, 1951, 72 y.o.   MRN: [id number]  HPI She is here for preoperative exam prior to TKR which is scheduled for July.  She has no underlying heart or pulmonary problems.  She does have underlying allergies and has these under good control.   Review of Systems     Objective:   Physical Exam Alert and in no distress. Tympanic membranes and canals are normal. Pharyngeal area is normal. Neck is supple without adenopathy or thyromegaly. Cardiac exam shows a regular sinus rhythm without murmurs or gallops. Lungs are clear to auscultation.        Assessment & Plan:  Preoperative examination  Non-seasonal allergic rhinitis due to pollen She is cleared for surgery.  She has no other major mitigating problems that would interfere with surgery.  Discussed postoperative care in terms of range of motion and strengthening exercises.

## 2023-01-19 DIAGNOSIS — F411 Generalized anxiety disorder: Secondary | ICD-10-CM | POA: Diagnosis not present

## 2023-01-19 DIAGNOSIS — K219 Gastro-esophageal reflux disease without esophagitis: Secondary | ICD-10-CM | POA: Diagnosis not present

## 2023-01-19 DIAGNOSIS — R32 Unspecified urinary incontinence: Secondary | ICD-10-CM | POA: Diagnosis not present

## 2023-01-19 DIAGNOSIS — H409 Unspecified glaucoma: Secondary | ICD-10-CM | POA: Diagnosis not present

## 2023-01-19 DIAGNOSIS — M5440 Lumbago with sciatica, unspecified side: Secondary | ICD-10-CM | POA: Diagnosis not present

## 2023-01-19 DIAGNOSIS — M8580 Other specified disorders of bone density and structure, unspecified site: Secondary | ICD-10-CM | POA: Diagnosis not present

## 2023-01-19 DIAGNOSIS — M1990 Unspecified osteoarthritis, unspecified site: Secondary | ICD-10-CM | POA: Diagnosis not present

## 2023-01-19 DIAGNOSIS — J301 Allergic rhinitis due to pollen: Secondary | ICD-10-CM | POA: Diagnosis not present

## 2023-01-19 DIAGNOSIS — I129 Hypertensive chronic kidney disease with stage 1 through stage 4 chronic kidney disease, or unspecified chronic kidney disease: Secondary | ICD-10-CM | POA: Diagnosis not present

## 2023-01-19 DIAGNOSIS — N182 Chronic kidney disease, stage 2 (mild): Secondary | ICD-10-CM | POA: Diagnosis not present

## 2023-01-19 DIAGNOSIS — F324 Major depressive disorder, single episode, in partial remission: Secondary | ICD-10-CM | POA: Diagnosis not present

## 2023-01-19 DIAGNOSIS — E785 Hyperlipidemia, unspecified: Secondary | ICD-10-CM | POA: Diagnosis not present

## 2023-02-08 DIAGNOSIS — M25661 Stiffness of right knee, not elsewhere classified: Secondary | ICD-10-CM | POA: Diagnosis not present

## 2023-02-08 DIAGNOSIS — M25561 Pain in right knee: Secondary | ICD-10-CM | POA: Diagnosis not present

## 2023-02-10 ENCOUNTER — Telehealth: Payer: Self-pay | Admitting: Family Medicine

## 2023-02-10 NOTE — Telephone Encounter (Signed)
Marilyn Dixon came in the office, she has surgery in July and needs blood work done to check her CBC and CMP. She wants to have it done next week. I just need an ok from an provider to be able to schedule her?

## 2023-02-14 ENCOUNTER — Other Ambulatory Visit: Payer: Self-pay

## 2023-02-14 DIAGNOSIS — Z01818 Encounter for other preprocedural examination: Secondary | ICD-10-CM

## 2023-02-15 ENCOUNTER — Other Ambulatory Visit: Payer: Medicare HMO

## 2023-02-15 DIAGNOSIS — Z01818 Encounter for other preprocedural examination: Secondary | ICD-10-CM | POA: Diagnosis not present

## 2023-02-15 LAB — COMPREHENSIVE METABOLIC PANEL
ALT: 18 IU/L (ref 0–32)
AST: 23 IU/L (ref 0–40)
Albumin/Globulin Ratio: 2.1 (ref 1.2–2.2)
Albumin: 4.5 g/dL (ref 3.8–4.8)
Alkaline Phosphatase: 171 IU/L — ABNORMAL HIGH (ref 44–121)
BUN/Creatinine Ratio: 17 (ref 12–28)
BUN: 15 mg/dL (ref 8–27)
Bilirubin Total: 0.5 mg/dL (ref 0.0–1.2)
CO2: 23 mmol/L (ref 20–29)
Calcium: 9.9 mg/dL (ref 8.7–10.3)
Chloride: 104 mmol/L (ref 96–106)
Creatinine, Ser: 0.89 mg/dL (ref 0.57–1.00)
Globulin, Total: 2.1 g/dL (ref 1.5–4.5)
Glucose: 100 mg/dL — ABNORMAL HIGH (ref 70–99)
Potassium: 5 mmol/L (ref 3.5–5.2)
Sodium: 142 mmol/L (ref 134–144)
Total Protein: 6.6 g/dL (ref 6.0–8.5)
eGFR: 69 mL/min/{1.73_m2} (ref >59)

## 2023-02-15 LAB — CBC WITH DIFFERENTIAL/PLATELET
Basophils Absolute: 0 10*3/uL (ref 0.0–0.2)
Basos: 1 %
EOS (ABSOLUTE): 0.2 10*3/uL (ref 0.0–0.4)
Eos: 3 %
Hematocrit: 46.1 % (ref 34.0–46.6)
Hemoglobin: 15.6 g/dL (ref 11.1–15.9)
Immature Grans (Abs): 0 10*3/uL (ref 0.0–0.1)
Immature Granulocytes: 0 %
Lymphocytes Absolute: 1.7 10*3/uL (ref 0.7–3.1)
Lymphs: 24 %
MCH: 30.2 pg (ref 26.6–33.0)
MCHC: 33.8 g/dL (ref 31.5–35.7)
MCV: 89 fL (ref 79–97)
Monocytes Absolute: 0.5 10*3/uL (ref 0.1–0.9)
Monocytes: 7 %
Neutrophils Absolute: 4.6 10*3/uL (ref 1.4–7.0)
Neutrophils: 65 %
Platelets: 248 10*3/uL (ref 150–450)
RBC: 5.17 x10E6/uL (ref 3.77–5.28)
RDW: 12.1 % (ref 11.7–15.4)
WBC: 7 10*3/uL (ref 3.4–10.8)

## 2023-03-13 ENCOUNTER — Encounter: Payer: Self-pay | Admitting: Family Medicine

## 2023-03-13 ENCOUNTER — Ambulatory Visit (INDEPENDENT_AMBULATORY_CARE_PROVIDER_SITE_OTHER): Payer: Medicare HMO | Admitting: Family Medicine

## 2023-03-13 VITALS — BP 150/88 | HR 80 | Ht 64.0 in | Wt 154.0 lb

## 2023-03-13 DIAGNOSIS — S0083XA Contusion of other part of head, initial encounter: Secondary | ICD-10-CM

## 2023-03-13 DIAGNOSIS — R269 Unspecified abnormalities of gait and mobility: Secondary | ICD-10-CM

## 2023-03-13 DIAGNOSIS — W19XXXA Unspecified fall, initial encounter: Secondary | ICD-10-CM

## 2023-03-13 DIAGNOSIS — S0990XA Unspecified injury of head, initial encounter: Secondary | ICD-10-CM | POA: Diagnosis not present

## 2023-03-13 DIAGNOSIS — R454 Irritability and anger: Secondary | ICD-10-CM | POA: Diagnosis not present

## 2023-03-13 IMAGING — MG MM DIGITAL SCREENING BILAT W/ TOMO AND CAD
6 of 10 series · 6 of 30 positions shown · non-contrast
Comparison: Previous exam(s).

CLINICAL DATA: Screening.

EXAM:
DIGITAL SCREENING BILATERAL MAMMOGRAM WITH TOMOSYNTHESIS AND CAD
TECHNIQUE: Bilateral screening digital craniocaudal and mediolateral oblique
mammograms were obtained. Bilateral screening digital breast
tomosynthesis was performed. The images were evaluated with
computer-aided detection.

[L MLO synth-2D]
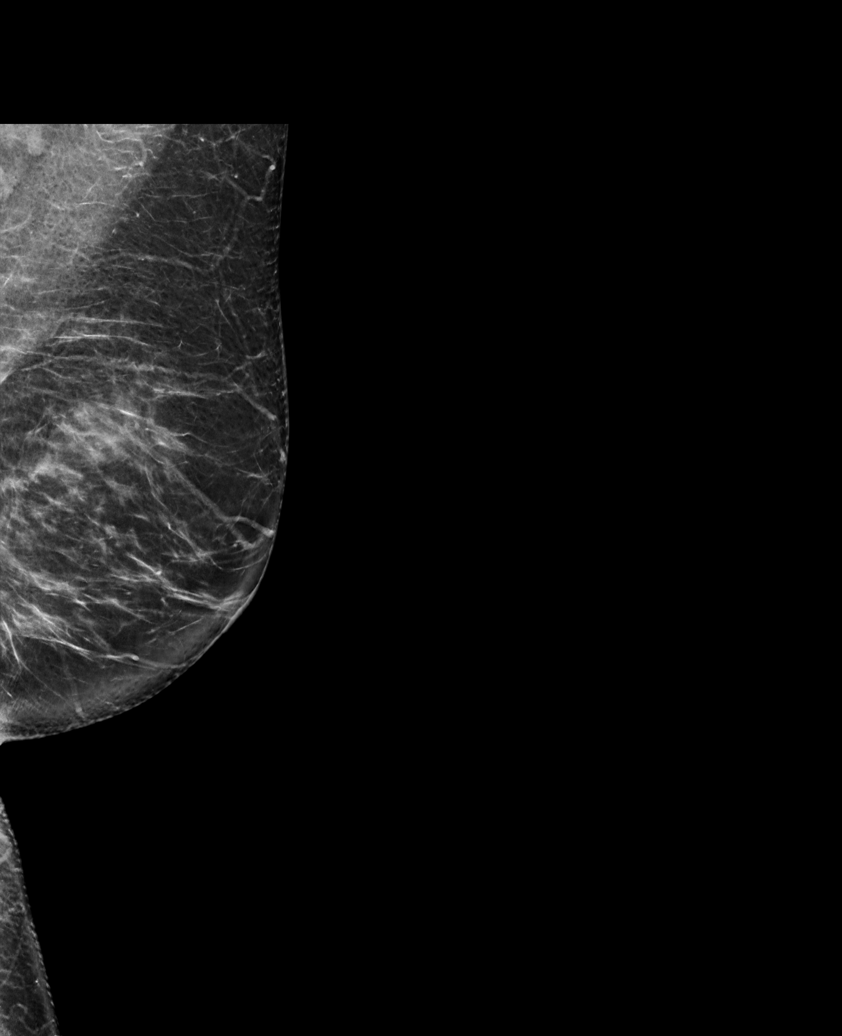

[L CC synth-2D]
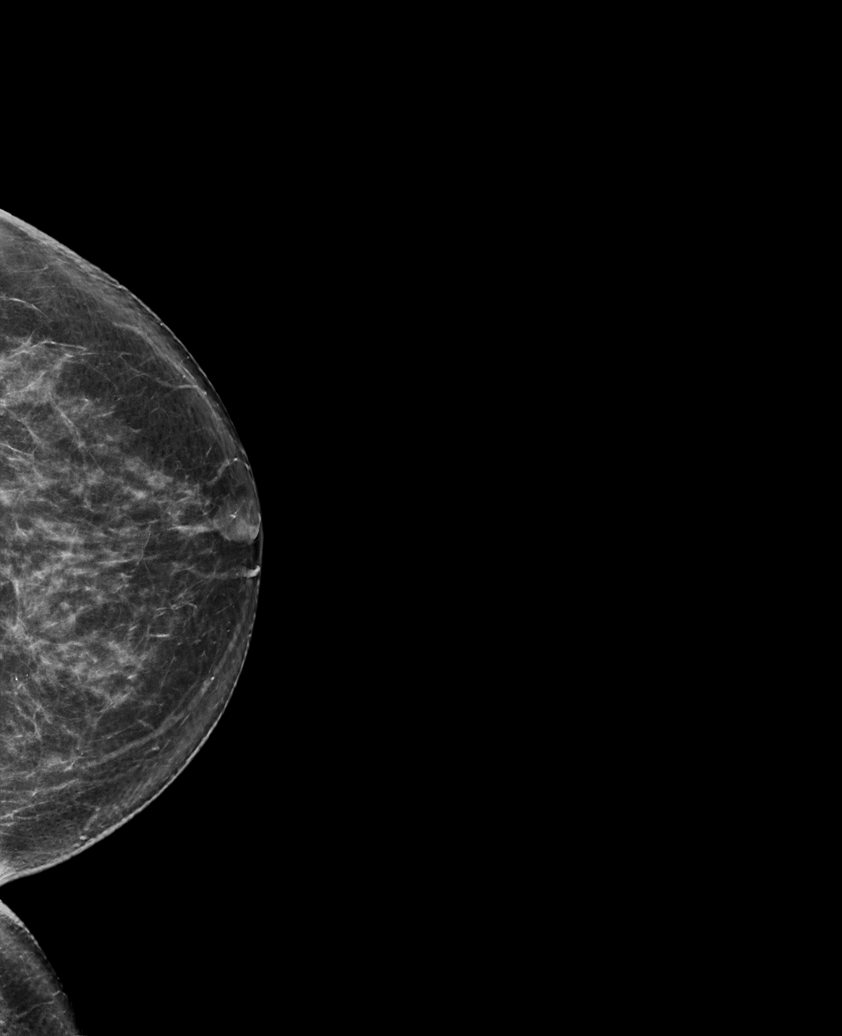

[L XCCL synth-2D]
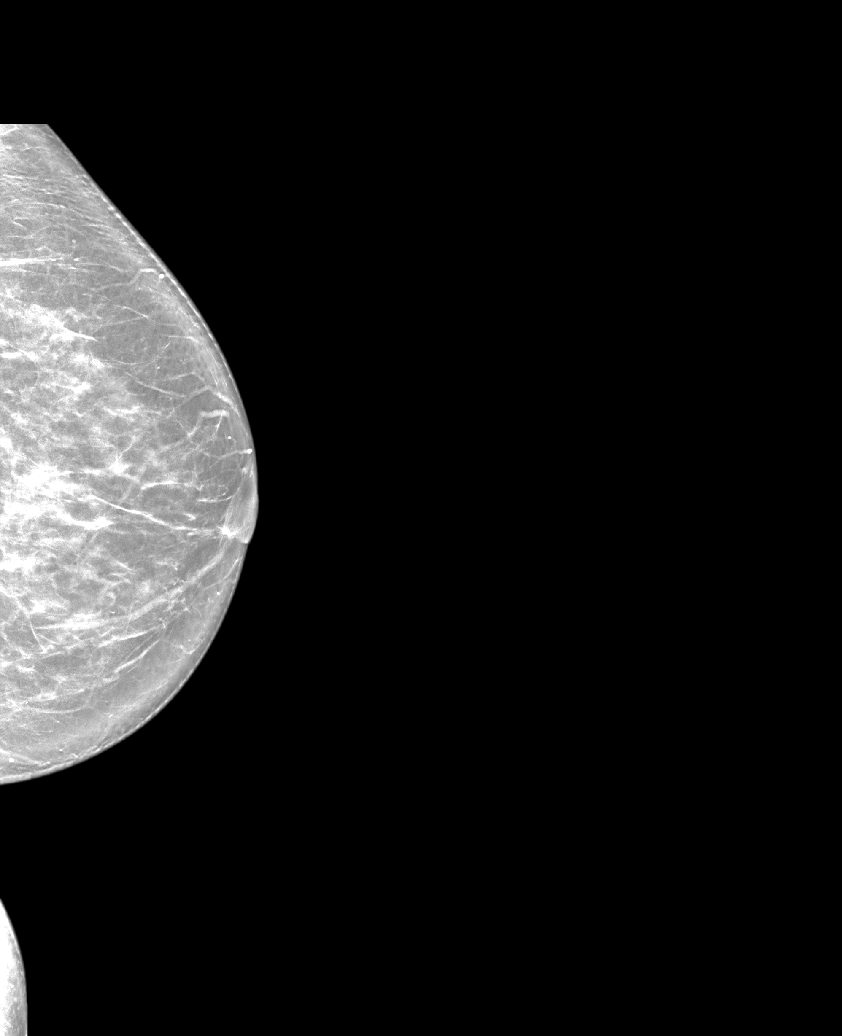

[R CC synth-2D]
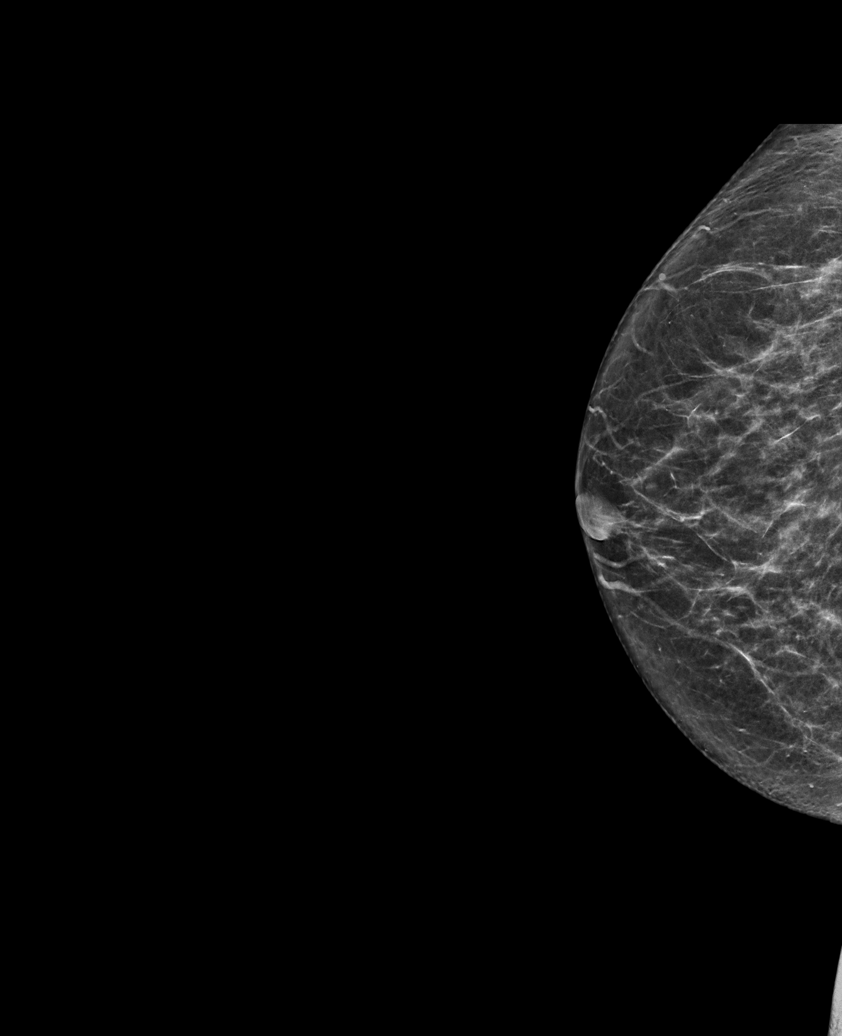

[R MLO synth-2D]
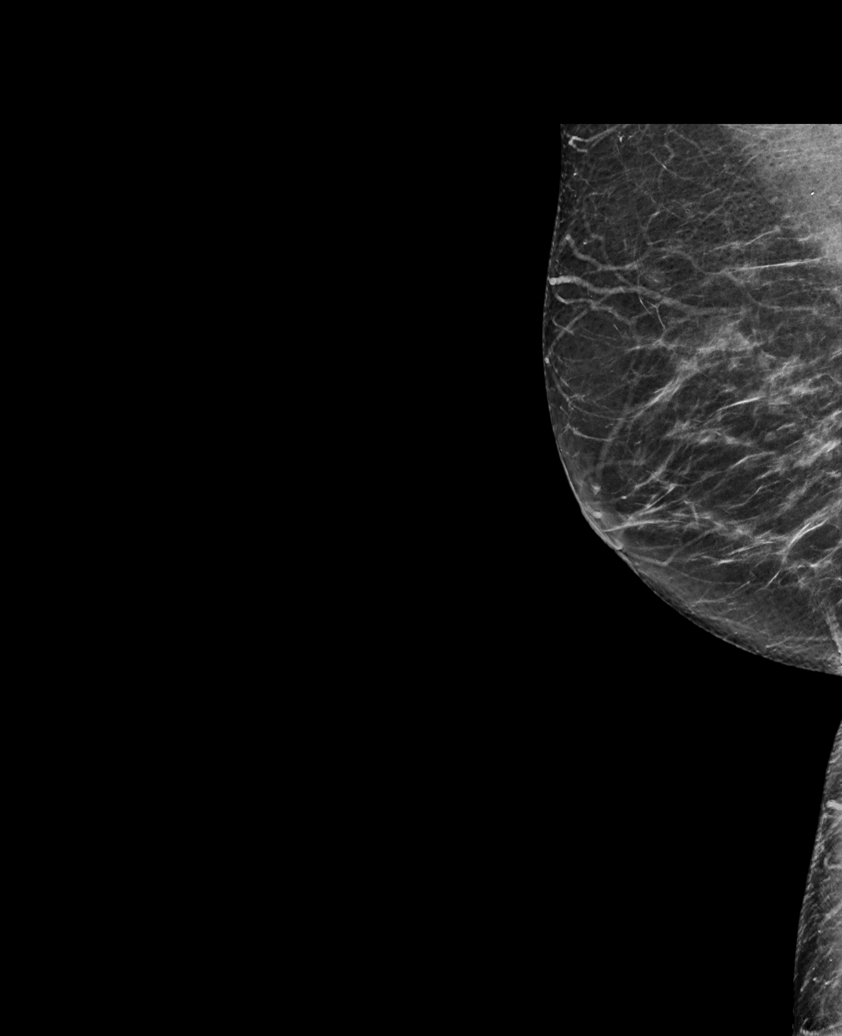

[L XCCL tomo · tomo slice 41/81.0]
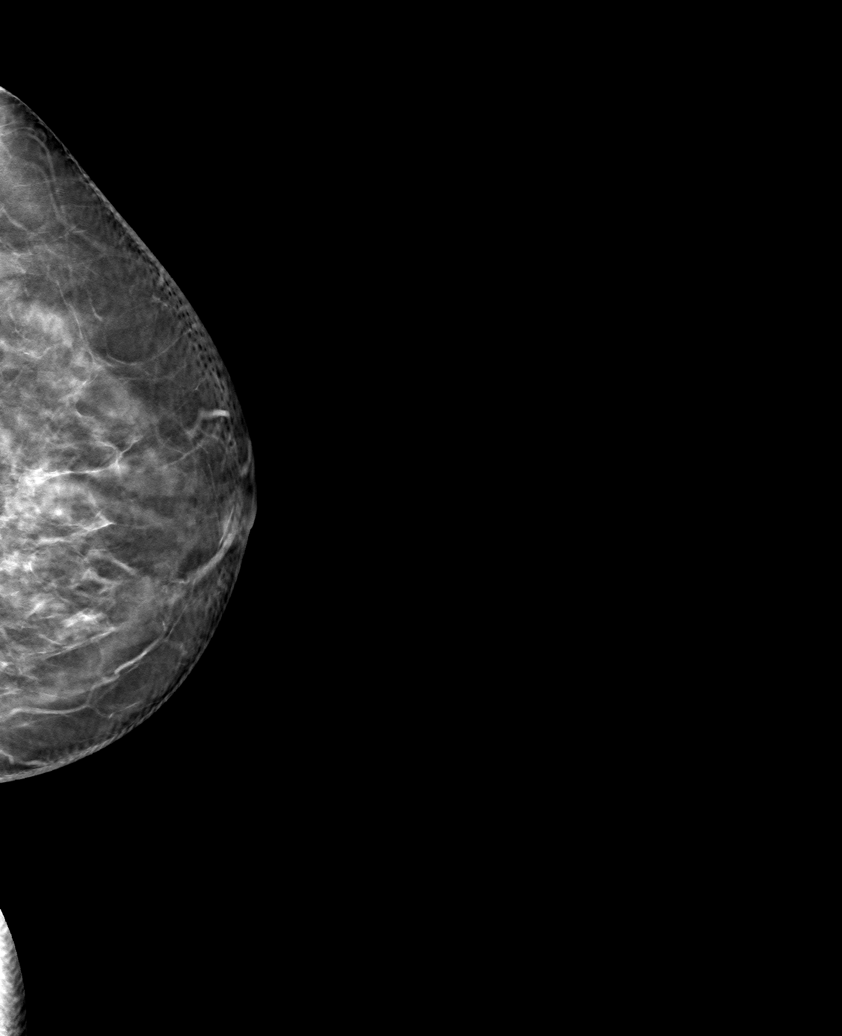

[6 of 30 positions shown; findings below may reference images not displayed]

ACR Breast Density Category c: The breast tissue is heterogeneously
dense, which may obscure small masses.
FINDINGS: There are no findings suspicious for malignancy.
IMPRESSION: No mammographic evidence of malignancy. A result letter of this
screening mammogram will be mailed directly to the patient.

RECOMMENDATION:
Screening mammogram in one year. (Code:Q3-W-BC3)

BI-RADS CATEGORY  1: Negative.

## 2023-03-13 NOTE — Progress Notes (Addendum)
Chief Complaint  Patient presents with   Marilyn Dixon in [place], grate in pavement was loose. Not sure if walking stick made grate come loose and she fell. Date of fall was 03/08/23 around 3pm. Eyes are blackened. Hit her forehead, had healed. Had nosebleed when she fell. Right elbow is scabbed up. Scheduled for right knee surgery next Tues @ 9:45am-Dr. Alusio.   03/08/23--while walking on a somewhat uneven surface in [place], using walking sticks, somehow a grate interfered with her walking (?if one of her sticks hit it).  She fell, hit the front of her head (forehead, scraped her nose which was bleeding, also hit R elbow).  She denies LOC.  After falling, they noted that the grate was dislodged. Denies any dizziness or feeling badly prior to the fall--was more mechanical, not due to feeling badly, denies syncope.  Went to ER in [place] on 6/19.  No x-rays or scans were done. Did neuro exam, reportedly normal. They thought they would have CT scan done, but didn't.  She states that the swelling in the forehead has been improving.  The bruising on the face has gotten worse.  Denies HA, numbness, tingling, weakness or tremor. Denies balance problems or falls since then. She is due to have knee surgery, walks with cane and wears a brace. No change in vision or hearing.  She feels at her baseline, denies any problems--other than some residual swelling on her forehead and nose and discoloration around and below her eyes.  Stopped all medications on 6/17 in anticipation of  R TKR scheduled for next week.  PMH, PSH, SH reviewed  Outpatient Encounter Medications as of 03/13/2023  Medication Sig Note   acetaminophen (TYLENOL) 500 MG tablet Take 1,000 mg by mouth every 6 (six) hours as needed. 03/13/2023: Last dose 8am 1000mg    atorvastatin (LIPITOR) 10 MG tablet Take 1 tablet (10 mg total) by mouth daily. (Patient not taking: Reported on 03/13/2023) 03/13/2023: On hold due to surgery   Black Cohosh 540 MG  CAPS Take 1 capsule by mouth. (Patient not taking: Reported on 03/13/2023) 03/13/2023: On hold due to surgery   cetirizine (ZYRTEC) 10 MG tablet Take 10 mg by mouth daily. (Patient not taking: Reported on 03/13/2023) 03/13/2023: On hold due to surgery   clonazePAM (KLONOPIN) 0.5 MG tablet TAKE 1 TABLET BY MOUTH TWICE A DAY AS NEEDED FOR ANXIETY (Patient not taking: Reported on 03/13/2023) 03/13/2023: On hold due to surgery   meloxicam (MOBIC) 7.5 MG tablet TAKE 1 TABLET BY MOUTH EVERY DAY (Patient not taking: Reported on 03/13/2023) 06/27/2022: Prn last dose yesterday   sertraline (ZOLOFT) 100 MG tablet Take 1 tablet (100 mg total) by mouth daily. (Patient not taking: Reported on 03/13/2023) 03/13/2023: On hold due to surgery   No facility-administered encounter medications on file as of 03/13/2023.   Allergies  Allergen Reactions   Sulfa Antibiotics     rash   ROS: no f/c, n/v/d, headaches, dizziness, chest pain, shortness of breath. Bruising per HPI. No URI symptoms. Some fatigue. No numbness, tingling.   PHYSICAL EXAM:  BP (!) 150/88   Pulse 80   Ht 5\' 4"  (1.626 m)   Wt 154 lb (69.9 kg)   BMI 26.43 kg/m   Well-appearing female with obvious bruising below both eyes and on both eyelids. She is in good spirits, and in no distress.  She had some trouble with recall and was slightly irritable during visit (to her husband, pleasant to me). HEENT:  conjunctiva and sclera are clear, EOMI. PERRL, fundi benign. Small area of soft tissue swelling above the medial portion of the left eyebrow, and also midline. No bruising in this area. There is bruising (purple) on both upper eyelids and below both eyes, with some yellow-green discoloration as well, and across the bridge of the nose. No bony stepoffs or focal tenderness on the skull or around orbits, just some swelling and tenderness at the bridge of the nose. Healing abrasion there also. She has a 3.5 cm large, protuberant mass (fluctuant, soft,  nontender)  at the right posterior scalp (chronic and unchanged per pt) TM's and EAC's normal, OP clear Neck: no c-spine tenderness. No lymphadenopathy, thyromegaly or bruit Heart: regular rate and rhythm Lungs: clear bilaterally Neuro exam--initally finger to nose exam was slower/awkward on the right, better on the left.  Repeated on the right, and it was improved.  Normal RAM bilaterally. Tandem gait very difficult. Balance issues, trouble getting one foot in front of the other. Wider based stance. DTR's 2 and symmetric. Normal strength throughout.  Cranial nerves intact. Extremities: wearing brace on R knee. No edema Skin: normal turgor. Bruising on face, scattered elsewhere. No rashes Psych: somewhat irritable intermittently.  Some trouble with recall of names intermittently.  Husband thinks she is tired from the travel back from [place] (notes a little difference also).   ASSESSMENT/PLAN:  Fall, initial encounter - Plan: CT HEAD W & WO CONTRAST ( )  Injury of head, initial encounter - Plan: CT HEAD W & WO CONTRAST ( )  Abnormal gait - Plan: CT HEAD W & WO CONTRAST ( )  Contusion of face, initial encounter - Plan: CT HEAD W & WO CONTRAST ( )  Irritability - Plan: CT HEAD W & WO CONTRAST ( )  Due to some mild neuro changes and significant bruising on face, want eval with CT to r/o fracture, or any internal injury or contre-coup injury.   Head CT

## 2023-03-14 ENCOUNTER — Ambulatory Visit
Admission: RE | Admit: 2023-03-14 | Discharge: 2023-03-14 | Disposition: A | Discharge status: Home / Self Care | Payer: Medicare HMO | Source: Ambulatory Visit | Attending: Family Medicine | Admitting: Family Medicine

## 2023-03-14 ENCOUNTER — Other Ambulatory Visit: Payer: Self-pay | Admitting: Family Medicine

## 2023-03-14 DIAGNOSIS — R454 Irritability and anger: Secondary | ICD-10-CM

## 2023-03-14 DIAGNOSIS — W19XXXA Unspecified fall, initial encounter: Secondary | ICD-10-CM

## 2023-03-14 DIAGNOSIS — R269 Unspecified abnormalities of gait and mobility: Secondary | ICD-10-CM

## 2023-03-14 DIAGNOSIS — S0083XA Contusion of other part of head, initial encounter: Secondary | ICD-10-CM

## 2023-03-14 DIAGNOSIS — S0990XA Unspecified injury of head, initial encounter: Secondary | ICD-10-CM

## 2023-03-14 DIAGNOSIS — S0003XA Contusion of scalp, initial encounter: Secondary | ICD-10-CM | POA: Diagnosis not present

## 2023-03-16 DIAGNOSIS — H5201 Hypermetropia, right eye: Secondary | ICD-10-CM | POA: Diagnosis not present

## 2023-03-16 DIAGNOSIS — H40013 Open angle with borderline findings, low risk, bilateral: Secondary | ICD-10-CM | POA: Diagnosis not present

## 2023-03-21 DIAGNOSIS — M1712 Unilateral primary osteoarthritis, left knee: Secondary | ICD-10-CM | POA: Diagnosis not present

## 2023-03-21 DIAGNOSIS — G8918 Other acute postprocedural pain: Secondary | ICD-10-CM | POA: Diagnosis not present

## 2023-03-21 HISTORY — PX: TOTAL KNEE ARTHROPLASTY: SHX125

## 2023-03-24 DIAGNOSIS — M25561 Pain in right knee: Secondary | ICD-10-CM | POA: Diagnosis not present

## 2023-03-27 DIAGNOSIS — M25561 Pain in right knee: Secondary | ICD-10-CM | POA: Diagnosis not present

## 2023-03-29 DIAGNOSIS — M25561 Pain in right knee: Secondary | ICD-10-CM | POA: Diagnosis not present

## 2023-03-31 DIAGNOSIS — M25561 Pain in right knee: Secondary | ICD-10-CM | POA: Diagnosis not present

## 2023-04-03 DIAGNOSIS — M25561 Pain in right knee: Secondary | ICD-10-CM | POA: Diagnosis not present

## 2023-04-05 DIAGNOSIS — M25561 Pain in right knee: Secondary | ICD-10-CM | POA: Diagnosis not present

## 2023-04-07 DIAGNOSIS — M25561 Pain in right knee: Secondary | ICD-10-CM | POA: Diagnosis not present

## 2023-04-11 DIAGNOSIS — M25561 Pain in right knee: Secondary | ICD-10-CM | POA: Diagnosis not present

## 2023-04-13 DIAGNOSIS — M25561 Pain in right knee: Secondary | ICD-10-CM | POA: Diagnosis not present

## 2023-04-17 DIAGNOSIS — M25561 Pain in right knee: Secondary | ICD-10-CM | POA: Diagnosis not present

## 2023-04-19 DIAGNOSIS — M25561 Pain in right knee: Secondary | ICD-10-CM | POA: Diagnosis not present

## 2023-04-24 DIAGNOSIS — M25561 Pain in right knee: Secondary | ICD-10-CM | POA: Diagnosis not present

## 2023-04-25 DIAGNOSIS — Z5189 Encounter for other specified aftercare: Secondary | ICD-10-CM | POA: Diagnosis not present

## 2023-04-26 DIAGNOSIS — M25561 Pain in right knee: Secondary | ICD-10-CM | POA: Diagnosis not present

## 2023-05-01 DIAGNOSIS — M25561 Pain in right knee: Secondary | ICD-10-CM | POA: Diagnosis not present

## 2023-05-03 DIAGNOSIS — H40013 Open angle with borderline findings, low risk, bilateral: Secondary | ICD-10-CM | POA: Diagnosis not present

## 2023-05-03 DIAGNOSIS — M25561 Pain in right knee: Secondary | ICD-10-CM | POA: Diagnosis not present

## 2023-05-11 ENCOUNTER — Other Ambulatory Visit: Payer: Self-pay | Admitting: Family Medicine

## 2023-05-11 DIAGNOSIS — F419 Anxiety disorder, unspecified: Secondary | ICD-10-CM

## 2023-05-11 MED ORDER — CLONAZEPAM 0.5 MG PO TABS: ORAL_TABLET | ORAL | 0 refills | Status: DC

## 2023-05-11 NOTE — Telephone Encounter (Signed)
Last refill: 05/17/2022 qty 60 with 0 tablets

## 2023-05-16 ENCOUNTER — Ambulatory Visit (INDEPENDENT_AMBULATORY_CARE_PROVIDER_SITE_OTHER): Payer: Medicare HMO

## 2023-05-16 VITALS — BP 102/64 | HR 100 | Temp 98.3°F | Ht 62.5 in | Wt 151.6 lb

## 2023-05-16 DIAGNOSIS — Z0000 Encounter for general adult medical examination without abnormal findings: Secondary | ICD-10-CM

## 2023-05-16 NOTE — Patient Instructions (Signed)
Marilyn Dixon , Thank you for taking time to come for your Medicare Wellness Visit. I appreciate your ongoing commitment to your health goals. Please review the following plan we discussed and let me know if I can assist you in the future.   Referrals/Orders/Follow-Ups/Clinician Recommendations: none  This is a list of the screening recommended for you and due dates:  Health Maintenance  Topic Date Due   COVID-19 Vaccine (7 - 2023-24 season) 11/19/2022   Flu Shot  04/20/2023   Medicare Annual Wellness Visit  05/15/2024   Mammogram  10/07/2024   Colon Cancer Screening  01/09/2031   DTaP/Tdap/Td vaccine (5 - Td or Tdap) 10/30/2031   DEXA scan (bone density measurement)  Completed   Hepatitis C Screening  Completed   Zoster (Shingles) Vaccine  Completed   HPV Vaccine  Aged Out   Pneumonia Vaccine  Discontinued    Advanced directives: (Copy Requested) Please bring a copy of your health care power of attorney and living will to the office to be added to your chart at your convenience.  Next Medicare Annual Wellness Visit scheduled for next year: Yes  Insert Preventive Care attachment Insert FALL PREVENTION attachment if needed

## 2023-05-16 NOTE — Progress Notes (Signed)
Subjective:   Marilyn Dixon is a 72 y.o. female who presents for Medicare Annual (Subsequent) preventive examination.  Visit Complete: In person    Review of Systems     Cardiac Risk Factors include: advanced age (>13men, >22 women);dyslipidemia     Objective:    Today's Vitals   05/16/23 1448  BP: 102/64  Pulse: 100  Temp: 98.3 F (36.8 C)  TempSrc: Oral  SpO2: 98%  Weight: 151 lb 9.6 oz (68.8 kg)  Height: 5' 2.5" (1.588 m)  PainSc: 7    Body mass index is 27.29 kg/m.     05/16/2023    3:03 PM 05/13/2022    1:35 PM 08/26/2021   11:59 AM 05/10/2021    1:52 PM 12/17/2019   10:06 AM 12/11/2018    9:03 AM 08/25/2016   12:13 PM  Advanced Directives  Does Patient Have a Medical Advance Directive? Yes Yes Yes Yes Yes Yes Yes  Type of Estate agent of [place];Living will Healthcare Power of [place];Living will Healthcare Power of [place];Living will Healthcare Power of [place];Living will Healthcare Power of [place];Living will Healthcare Power of [place];Living will   Does patient want to make changes to medical advance directive?   No - Patient declined Yes (ED - Information included in AVS) No - Patient declined No - Patient declined Yes (MAU/Ambulatory/Procedural Areas - Information given)  Copy of Healthcare Power of Attorney in Chart? No - copy requested No - copy requested No - copy requested No - copy requested No - copy requested Yes - validated most recent copy scanned in chart (See row information)     Current Medications (verified) Outpatient Encounter Medications as of 05/16/2023  Medication Sig   acetaminophen (TYLENOL) 500 MG tablet Take 1,000 mg by mouth every 6 (six) hours as needed.   atorvastatin (LIPITOR) 10 MG tablet Take 1 tablet (10 mg total) by mouth daily.   Black Cohosh 540 MG CAPS Take 1 capsule by mouth.   cetirizine (ZYRTEC) 10 MG tablet Take 10 mg by mouth daily.   clonazePAM (KLONOPIN) 0.5 MG tablet TAKE 1 TABLET  BY MOUTH TWICE A DAY AS NEEDED FOR ANXIETY   Melatonin 5 MG CAPS Take 1 capsule by mouth at bedtime.   meloxicam (MOBIC) 7.5 MG tablet TAKE 1 TABLET BY MOUTH EVERY DAY   methocarbamol (ROBAXIN) 500 MG tablet TAKE 1 TABLET BY MOUTH EVERY 6 HOURS AS NEEDED MUSCLE SPASMS   Multiple Vitamins-Minerals (HAIR SKIN AND NAILS FORMULA PO) Take 3 tablets by mouth daily.   sertraline (ZOLOFT) 100 MG tablet Take 1 tablet (100 mg total) by mouth daily.   No facility-administered encounter medications on file as of 05/16/2023.    Allergies (verified) Sulfa antibiotics   History: Past Medical History:  Diagnosis Date   Allergy    Back pain    Dyslipidemia    Glaucoma    High cholesterol    Hx of adenomatous colonic polyps    Menopause    Seasonal affective disorder [facility])    Past Surgical History:  Procedure Laterality Date   COLONOSCOPY  2006/2011   Schooler,[place]   TOTAL KNEE ARTHROPLASTY Right 03/21/2023   History reviewed. No pertinent family history. Social History   Socioeconomic History   Marital status: Married    Spouse name: Not on file   Number of children: Not on file   Years of education: Not on file   Highest education level: Bachelor's degree (e.g., BA, AB, BS)  Occupational History  Not on file  Tobacco Use   Smoking status: Never   Smokeless tobacco: Never  Vaping Use   Vaping status: Never Used  Substance and Sexual Activity   Alcohol use: Yes    Alcohol/week: 3.0 standard drinks of alcohol    Types: 3 drink(s) per week   Drug use: No   Sexual activity: Yes  Other Topics Concern   Not on file  Social History Narrative   Not on file   Social Determinants of Health   Financial Resource Strain: Low Risk  (05/16/2023)   Overall Financial Resource Strain (CARDIA)    Difficulty of Paying Living Expenses: Not hard at all  Food Insecurity: No Food Insecurity (05/16/2023)   Hunger Vital Sign    Worried About Running Out of Food in the Last Year: Never true     Ran Out of Food in the Last Year: Never true  Transportation Needs: No Transportation Needs (05/16/2023)   PRAPARE - Administrator, Civil Service (Medical): No    Lack of Transportation (Non-Medical): No  Physical Activity: Sufficiently Active (05/16/2023)   Exercise Vital Sign    Days of Exercise per Week: 3 days    Minutes of Exercise per Session: 50 min  Stress: Stress Concern Present (05/16/2023)   [employer] of Occupational Health - Occupational Stress Questionnaire    Feeling of Stress : To some extent  Social Connections: Socially Integrated (05/16/2023)   Social Connection and Isolation Panel [NHANES]    Frequency of Communication with Friends and Family: More than three times a week    Frequency of Social Gatherings with Friends and Family: Once a week    Attends Religious Services: More than 4 times per year    Active Member of [employer] or Organizations: Yes    Attends Engineer, structural: More than 4 times per year    Marital Status: Married    Tobacco Counseling Counseling given: Not Answered   Clinical Intake:  Pre-visit preparation completed: Yes  Pain : 0-10 Pain Score: 7  Pain Type: Acute pain Pain Orientation: Right Pain Descriptors / Indicators: Discomfort Pain Onset: More than a month ago Pain Frequency: Constant     Nutritional Status: BMI 25 -29 Overweight Nutritional Risks: None Diabetes: No  How often do you need to have someone help you when you read instructions, pamphlets, or other written materials from your doctor or pharmacy?: 1 - Never  Interpreter Needed?: No  Information entered by :: NAllen LPN   Activities of Daily Living    05/16/2023    2:52 PM  In your present state of health, do you have any difficulty performing the following activities:  Hearing? 0  Vision? 0  Difficulty concentrating or making decisions? 0  Walking or climbing stairs? 1  Comment very slow  Dressing or bathing? 0  Doing  errands, shopping? 0  Preparing Food and eating ? N  Using the Toilet? N  In the past six months, have you accidently leaked urine? N  Do you have problems with loss of bowel control? N  Managing your Medications? N  Managing your Finances? N  Housekeeping or managing your Housekeeping? N    Patient Care Team: Ronnald Nian, MD as PCP - General (Family Medicine) Pa, [facility] Ollen Gross, MD as Consulting Physician (Orthopedic Surgery) Wynona Canes, MD as Referring Physician (Specialist)  Indicate any recent Medical Services you may have received from other than [facility] providers in the past  year (date may be approximate).     Assessment:   This is a routine wellness examination for Marilyn Dixon.  Hearing/Vision screen Hearing Screening - Comments:: Denies hearing issues Vision Screening - Comments:: Regular eye exams, [facility]  Dietary issues and exercise activities discussed:     Goals Addressed             This Visit's Progress    Patient Stated       05/16/2023, start working with a gym trainer       Depression Screen    05/16/2023    3:10 PM 05/13/2022    1:37 PM 05/10/2021    1:50 PM 02/26/2021   10:25 AM 12/17/2019    9:37 AM 12/11/2018    8:40 AM 12/07/2017   11:04 AM  PHQ 2/9 Scores  PHQ - 2 Score 0 0 0 0 1 0 0  PHQ- 9 Score  0         Fall Risk    05/16/2023    3:06 PM 03/13/2023    1:30 PM 01/11/2023   10:45 AM 05/13/2022    1:36 PM 05/12/2022    2:53 PM  Fall Risk   Falls in the past year? 1 1 0 1 1  Comment due to the knee   tripped   Number falls in past yr: 1 0 0 0 0  Comment  03/08/23     Injury with Fall? 0 0 0 0 0  Comment  bruising to foreheas, black eyes and scraped up right elbow     Risk for fall due to : History of fall(s);Impaired mobility;Impaired balance/gait;Medication side effect History of fall(s) No Fall Risks Medication side effect;Impaired mobility   Follow up Falls prevention discussed;Falls  evaluation completed Falls evaluation completed Falls evaluation completed Falls evaluation completed;Education provided;Falls prevention discussed     MEDICARE RISK AT HOME: Medicare Risk at Home Any stairs in or around the home?: Yes If so, are there any without handrails?: No Home free of loose throw rugs in walkways, pet beds, electrical cords, etc?: Yes Adequate lighting in your home to reduce risk of falls?: Yes Life alert?: No Use of a cane, walker or w/c?: Yes Grab bars in the bathroom?: Yes Shower chair or bench in shower?: Yes Elevated toilet seat or a handicapped toilet?: Yes  TIMED UP AND GO:  Was the test performed?  Yes  Length of time to ambulate 10 feet: 7 sec Gait slow and steady with assistive device    Cognitive Function:        05/16/2023    3:11 PM 05/13/2022    1:39 PM  6CIT Screen  What Year? 0 points 0 points  What month? 0 points 0 points  What time? 0 points 0 points  Count back from 20 0 points 0 points  Months in reverse 0 points 0 points  Repeat phrase 4 points 0 points  Total Score 4 points 0 points    Immunizations Immunization History  Administered Date(s) Administered   COVID-19, mRNA, vaccine(Comirnaty)12 years and older 07/21/2022   DT (Pediatric) 06/27/1997   Fluad Quad(high Dose 65+) 06/14/2019, 07/17/2020, 07/14/2021, 06/08/2022   Hepatitis A 06/19/2007, 07/22/2010   Influenza Split 09/23/1999, 07/20/2012, 06/20/2013   Influenza Whole 09/24/2002, 07/17/2009, 05/09/2010   Influenza, High Dose Seasonal PF 06/22/2015, 07/04/2016, 06/07/2017, 08/03/2018   Influenza,inj,Quad PF,6+ Mos 05/20/2014   Influenza-Unspecified 08/03/2018   PFIZER(Purple Top)SARS-COV-2 Vaccination 10/26/2019, 11/20/2019, 06/19/2020, 03/23/2021   Pfizer Covid-19 Vaccine Bivalent Booster 58yrs &  up 08/09/2021   Pneumococcal Conjugate-13 07/29/2013   Pneumococcal Polysaccharide-23 05/11/2005   Respiratory Syncytial Virus Vaccine,Recomb Aduvanted(Arexvy)  09/26/2022   Tdap 06/19/2007, 01/14/2011, 10/29/2021   Zoster Recombinant(Shingrix) 07/20/2017, 12/19/2017   Zoster, Live 07/22/2010    TDAP status: Up to date  Flu Vaccine status: Due, Education has been provided regarding the importance of this vaccine. Advised may receive this vaccine at local pharmacy or Health Dept. Aware to provide a copy of the vaccination record if obtained from local pharmacy or Health Dept. Verbalized acceptance and understanding.  Pneumococcal vaccine status: Up to date  Covid-19 vaccine status: Completed vaccines  Qualifies for Shingles Vaccine? Yes   Zostavax completed Yes   Shingrix Completed?: Yes  Screening Tests Health Maintenance  Topic Date Due   COVID-19 Vaccine (7 - 2023-24 season) 11/19/2022   INFLUENZA VACCINE  04/20/2023   Medicare Annual Wellness (AWV)  05/15/2024   MAMMOGRAM  10/07/2024   Colonoscopy  01/09/2031   DTaP/Tdap/Td (5 - Td or Tdap) 10/30/2031   DEXA SCAN  Completed   Hepatitis C Screening  Completed   Zoster Vaccines- Shingrix  Completed   HPV VACCINES  Aged Out   Pneumonia Vaccine 27+ Years old  Discontinued    Health Maintenance  Health Maintenance Due  Topic Date Due   COVID-19 Vaccine (7 - 2023-24 season) 11/19/2022   INFLUENZA VACCINE  04/20/2023    Colorectal cancer screening: Type of screening: Colonoscopy. Completed 01/08/2021. Repeat every 3 years  Mammogram status: Completed 10/07/2022. Repeat every year  Bone Density status: Completed 09/21/2021.   Lung Cancer Screening: (Low Dose CT Chest recommended if Age 29-80 years, 20 pack-year currently smoking OR have quit w/in 15years.) does not qualify.   Lung Cancer Screening Referral: no  Additional Screening:  Hepatitis C Screening: does qualify; Completed 08/17/2015  Vision Screening: Recommended annual ophthalmology exams for early detection of glaucoma and other disorders of the eye. Is the patient up to date with their annual eye exam?  Yes  Who is  the provider or what is the name of the office in which the patient attends annual eye exams? [facility] If pt is not established with a provider, would they like to be referred to a provider to establish care? No .   Dental Screening: Recommended annual dental exams for proper oral hygiene  Diabetic Foot Exam: n/a  Community Resource Referral / Chronic Care Management: CRR required this visit?  No   CCM required this visit?  No     Plan:     I have personally reviewed and noted the following in the patient's chart:   Medical and social history Use of alcohol, tobacco or illicit drugs  Current medications and supplements including opioid prescriptions. Patient is not currently taking opioid prescriptions. Functional ability and status Nutritional status Physical activity Advanced directives List of other physicians Hospitalizations, surgeries, and ER visits in previous 12 months Vitals Screenings to include cognitive, depression, and falls Referrals and appointments  In addition, I have reviewed and discussed with patient certain preventive protocols, quality metrics, and best practice recommendations. A written personalized care plan for preventive services as well as general preventive health recommendations were provided to patient.     Barb Merino, LPN   10/19/8655   After Visit Summary: in person  Nurse Notes: none

## 2023-05-21 DIAGNOSIS — Z96651 Presence of right artificial knee joint: Secondary | ICD-10-CM | POA: Insufficient documentation

## 2023-05-23 ENCOUNTER — Ambulatory Visit (INDEPENDENT_AMBULATORY_CARE_PROVIDER_SITE_OTHER): Payer: Medicare HMO | Admitting: Family Medicine

## 2023-05-23 VITALS — BP 128/82 | HR 88 | Ht 62.5 in | Wt 151.4 lb

## 2023-05-23 DIAGNOSIS — M8580 Other specified disorders of bone density and structure, unspecified site: Secondary | ICD-10-CM | POA: Diagnosis not present

## 2023-05-23 DIAGNOSIS — K219 Gastro-esophageal reflux disease without esophagitis: Secondary | ICD-10-CM

## 2023-05-23 DIAGNOSIS — Z0000 Encounter for general adult medical examination without abnormal findings: Secondary | ICD-10-CM | POA: Diagnosis not present

## 2023-05-23 DIAGNOSIS — E785 Hyperlipidemia, unspecified: Secondary | ICD-10-CM | POA: Diagnosis not present

## 2023-05-23 DIAGNOSIS — Z86010 Personal history of colonic polyps: Secondary | ICD-10-CM | POA: Diagnosis not present

## 2023-05-23 DIAGNOSIS — Z96651 Presence of right artificial knee joint: Secondary | ICD-10-CM

## 2023-05-23 DIAGNOSIS — F338 Other recurrent depressive disorders: Secondary | ICD-10-CM | POA: Diagnosis not present

## 2023-05-23 DIAGNOSIS — Z9849 Cataract extraction status, unspecified eye: Secondary | ICD-10-CM

## 2023-05-23 DIAGNOSIS — R232 Flushing: Secondary | ICD-10-CM

## 2023-05-23 DIAGNOSIS — Z23 Encounter for immunization: Secondary | ICD-10-CM | POA: Diagnosis not present

## 2023-05-23 DIAGNOSIS — F419 Anxiety disorder, unspecified: Secondary | ICD-10-CM

## 2023-05-23 DIAGNOSIS — J301 Allergic rhinitis due to pollen: Secondary | ICD-10-CM

## 2023-05-23 DIAGNOSIS — H4010X0 Unspecified open-angle glaucoma, stage unspecified: Secondary | ICD-10-CM

## 2023-05-23 LAB — LIPID PANEL
Chol/HDL Ratio: 3.3 ratio (ref 0.0–4.4)
Cholesterol, Total: 177 mg/dL (ref 100–199)
HDL: 54 mg/dL
LDL Chol Calc (NIH): 93 mg/dL (ref 0–99)
Triglycerides: 173 mg/dL — ABNORMAL HIGH (ref 0–149)
VLDL Cholesterol Cal: 30 mg/dL (ref 5–40)

## 2023-05-23 MED ORDER — SERTRALINE HCL 100 MG PO TABS
100.0000 mg | ORAL_TABLET | Freq: Every day | ORAL | 3 refills | Status: DC
Start: 2023-05-23 — End: 2024-01-31

## 2023-05-23 MED ORDER — ATORVASTATIN CALCIUM 10 MG PO TABS
10.0000 mg | ORAL_TABLET | Freq: Every day | ORAL | 3 refills | Status: DC
Start: 2023-05-23 — End: 2024-06-27

## 2023-05-23 NOTE — Progress Notes (Signed)
Complete physical exam  Patient: Marilyn Dixon   DOB: 1951/05/12   72 y.o. Female  MRN: [id number]  Subjective:    Chief Complaint  Patient presents with   Annual Exam    Fasting annual exam, had AWV 05/16/23. Would like to get off sertraline and only take clonazepam. No other concerns.    Marilyn Dixon is a 72 y.o. female who presents today for a complete physical exam. She reports consuming a general diet. Gym/ health club routine includes cardio, weights, exercises for knee. She generally feels well. She reports sleeping fairly well. She does not have additional problems to discuss today.  She had a right TKR in July and continues in rehab and is making some progress.  She does have occasional difficulty with hot flashes and does use black cohosh.  She is scheduled for repeat colonoscopy in 2025.  She continues on sertraline as well as Klonopin.  She does use the Klonopin when she gets anxious admits to having difficulty with Soma on a daily basis.  She was involved in counseling in the past but has not had any since her therapist retired.  She continues on Lipitor and is having no difficulty with that.  She has greatly diminished the need for Mobic and plans to talk to orthopedics about that.  Continues on sertraline.   Most recent fall risk assessment:    05/23/2023    9:37 AM  Fall Risk   Falls in the past year? 1  Number falls in past yr: 0  Injury with Fall? 0  Risk for fall due to : No Fall Risks  Follow up Falls evaluation completed     Most recent depression screenings:    05/16/2023    3:10 PM 05/13/2022    1:37 PM  PHQ 2/9 Scores  PHQ - 2 Score 0 0  PHQ- 9 Score  0    Vision:Within last year    Patient Care Team: Ronnald Nian, MD as PCP - General (Family Medicine) Pa, [facility] Ollen Gross, MD as Consulting Physician (Orthopedic Surgery) Wynona Canes, MD as Referring Physician (Specialist)   Outpatient Medications Prior  to Visit  Medication Sig Note   Black Cohosh 540 MG CAPS Take 1 capsule by mouth.    cetirizine (ZYRTEC) 10 MG tablet Take 10 mg by mouth daily.    clonazePAM (KLONOPIN) 0.5 MG tablet TAKE 1 TABLET BY MOUTH TWICE A DAY AS NEEDED FOR ANXIETY 05/23/2023: Takes one daily   Melatonin 5 MG CAPS Take 1 capsule by mouth at bedtime.    meloxicam (MOBIC) 7.5 MG tablet TAKE 1 TABLET BY MOUTH EVERY DAY 05/23/2023: Takes one daily   Multiple Vitamins-Minerals (HAIR SKIN AND NAILS FORMULA PO) Take 3 tablets by mouth daily.    [DISCONTINUED] acetaminophen (TYLENOL) 500 MG tablet Take 1,000 mg by mouth every 6 (six) hours as needed.    [DISCONTINUED] atorvastatin (LIPITOR) 10 MG tablet Take 1 tablet (10 mg total) by mouth daily.    [DISCONTINUED] methocarbamol (ROBAXIN) 500 MG tablet TAKE 1 TABLET BY MOUTH EVERY 6 HOURS AS NEEDED MUSCLE SPASMS    [DISCONTINUED] sertraline (ZOLOFT) 100 MG tablet Take 1 tablet (100 mg total) by mouth daily.    No facility-administered medications prior to visit.    Review of Systems  All other systems reviewed and are negative.         Objective:     BP 128/82   Pulse 88   Ht  5' 2.5" (1.588 m)   Wt 151 lb 6.4 oz (68.7 kg)   SpO2 96%   BMI 27.25 kg/m    Physical Exam     Alert and in no distress. Tympanic membranes and canals are normal. Pharyngeal area is normal. Neck is supple without adenopathy or thyromegaly. Cardiac exam shows a regular sinus rhythm without murmurs or gallops. Lungs are clear to auscultation.  Abdominal exam shows no masses or tenderness.  Exam of the right knee does show this scar to be healing nicely.  Good mobility is noted of the scar.  She is not at full extension and flexion is around 120 degrees.  Assessment & Plan:   Routine general medical examination at a health care facility  Status post cataract extraction, unspecified laterality  Osteopenia, unspecified location  Hot flashes  Hyperlipidemia LDL goal <100 - Plan:  atorvastatin (LIPITOR) 10 MG tablet, Lipid panel  Seasonal affective disorder (HCC) - Plan: sertraline (ZOLOFT) 100 MG tablet  History of colonic polyps  Open-angle glaucoma, unspecified glaucoma stage, unspecified laterality, unspecified open-angle glaucoma type  Gastroesophageal reflux disease without esophagitis  Seasonal allergic rhinitis due to pollen  S/P TKR (total knee replacement), right  Anxiety - Plan: sertraline (ZOLOFT) 100 MG tablet  Need for influenza vaccination - Plan: Flu Vaccine Trivalent High Dose (Fluad)    Immunization History  Administered Date(s) Administered   COVID-19, mRNA, vaccine(Comirnaty)12 years and older 07/21/2022   DT (Pediatric) 06/27/1997   Fluad Quad(high Dose 65+) 06/14/2019, 07/17/2020, 07/14/2021, 06/08/2022   Fluad Trivalent(High Dose 65+) 05/23/2023   Hepatitis A 06/19/2007, 07/22/2010   Influenza Split 09/23/1999, 07/20/2012, 06/20/2013   Influenza Whole 09/24/2002, 07/17/2009, 05/09/2010   Influenza, High Dose Seasonal PF 06/22/2015, 07/04/2016, 06/07/2017, 08/03/2018   Influenza,inj,Quad PF,6+ Mos 05/20/2014   Influenza-Unspecified 08/03/2018   PFIZER(Purple Top)SARS-COV-2 Vaccination 10/26/2019, 11/20/2019, 06/19/2020, 03/23/2021   Pfizer Covid-19 Vaccine Bivalent Booster 65yrs & up 08/09/2021   Pneumococcal Conjugate-13 07/29/2013   Pneumococcal Polysaccharide-23 05/11/2005   Respiratory Syncytial Virus Vaccine,Recomb Aduvanted(Arexvy) 09/26/2022   Tdap 06/19/2007, 01/14/2011, 10/29/2021   Zoster Recombinant(Shingrix) 07/20/2017, 12/19/2017   Zoster, Live 07/22/2010    Health Maintenance  Topic Date Due   COVID-19 Vaccine (7 - 2023-24 season) 06/08/2023 (Originally 05/21/2023)   Medicare Annual Wellness (AWV)  05/15/2024   MAMMOGRAM  10/07/2024   Colonoscopy  01/09/2031   DTaP/Tdap/Td (5 - Td or Tdap) 10/30/2031   INFLUENZA VACCINE  Completed   DEXA SCAN  Completed   Hepatitis C Screening  Completed   Zoster Vaccines-  Shingrix  Completed   HPV VACCINES  Aged Out   Pneumonia Vaccine 52+ Years old  Discontinued    Discussed health benefits of physical activity, and encouraged her to engage in regular exercise appropriate for her age and condition.  Also encouraged her to continue with her physical therapy as well as her medication regimen. I then discussed the use of Klonopin with her and her encouraged her to get involved in further counseling to help deal with her anxiety and to decrease the need for Klonopin.  Will continue her on her sertraline.  Continue to treat the hot flashes with black cohosh.  Problem List Items Addressed This Visit     Allergic rhinitis due to pollen   Gastroesophageal reflux disease without esophagitis   Glaucoma   History of colonic polyps   Hot flashes   Relevant Medications   atorvastatin (LIPITOR) 10 MG tablet   Hyperlipidemia LDL goal <100   Relevant Medications  atorvastatin (LIPITOR) 10 MG tablet   Other Relevant Orders   Lipid panel   Osteopenia   S/P TKR (total knee replacement), right   Seasonal affective disorder (HCC)   Relevant Medications   sertraline (ZOLOFT) 100 MG tablet   Status post cataract extraction   Other Visit Diagnoses     Routine general medical examination at a health care facility    -  Primary   Anxiety       Relevant Medications   sertraline (ZOLOFT) 100 MG tablet   Need for influenza vaccination       Relevant Orders   Flu Vaccine Trivalent High Dose (Fluad) (Completed)      No follow-ups on file.     Sharlot Gowda, MD

## 2023-06-03 IMAGING — CR DG KNEE COMPLETE 4+V*R*
4 series · 4 of 4 positions shown · non-contrast
Comparison: None.

CLINICAL DATA: Pain

EXAM:
RIGHT KNEE - COMPLETE 4+ VIEW

[w knee ap right]
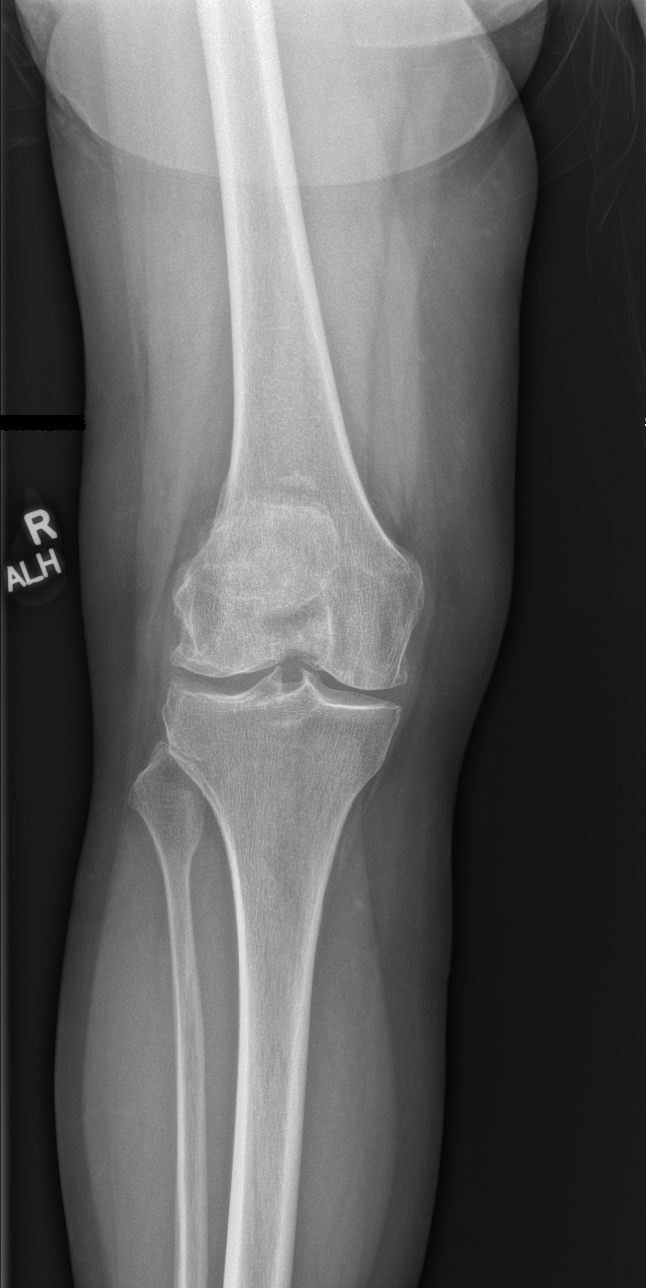

[w knee lat right]
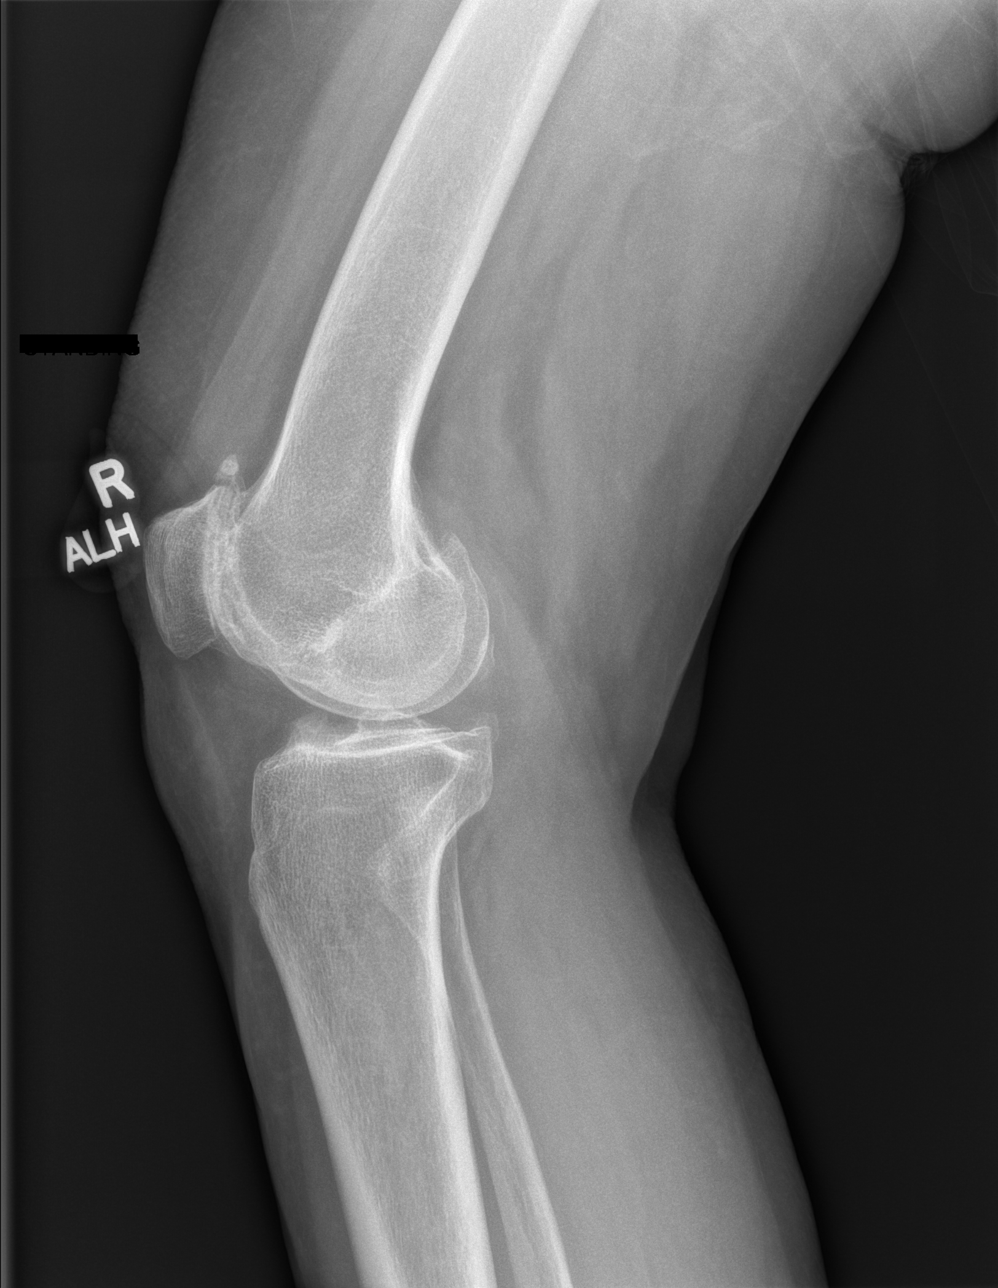

[w knee tunnel pa right]
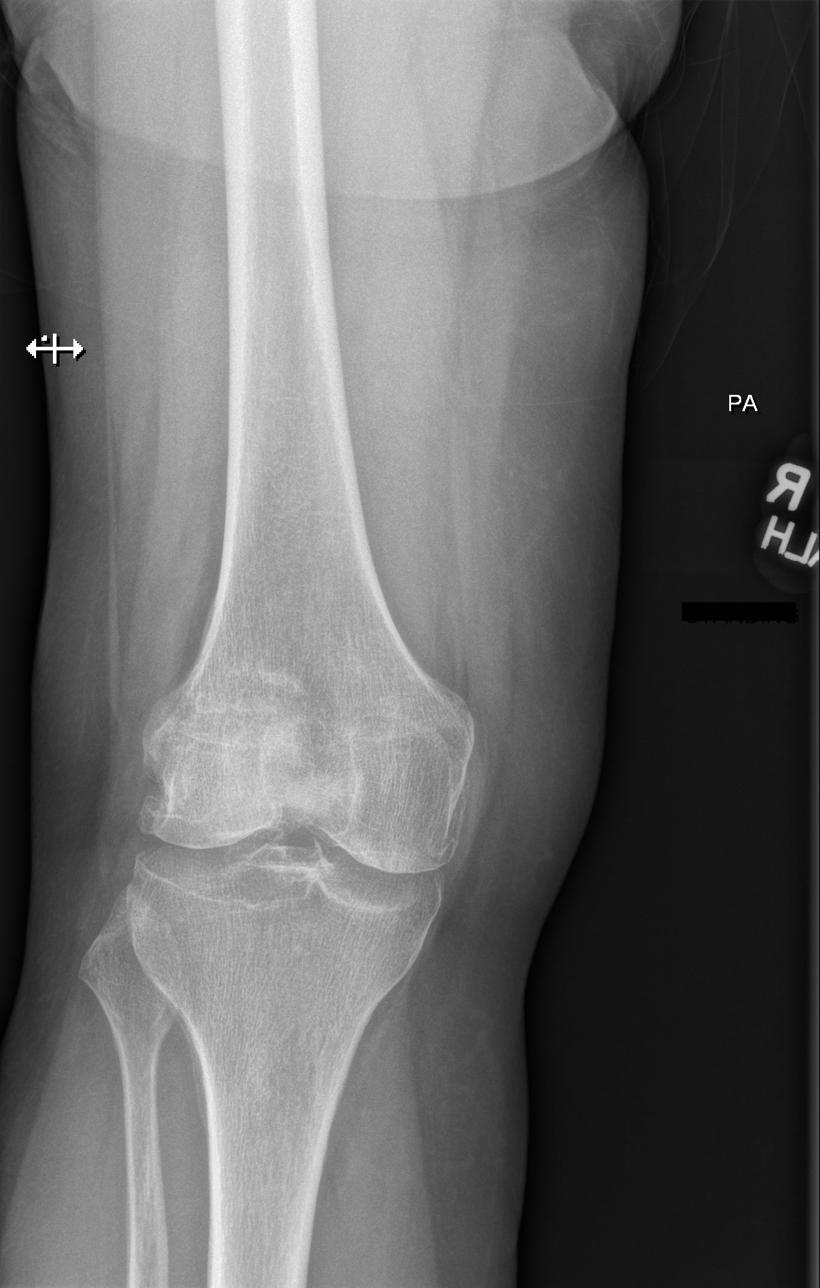

[x knee sunrise right]
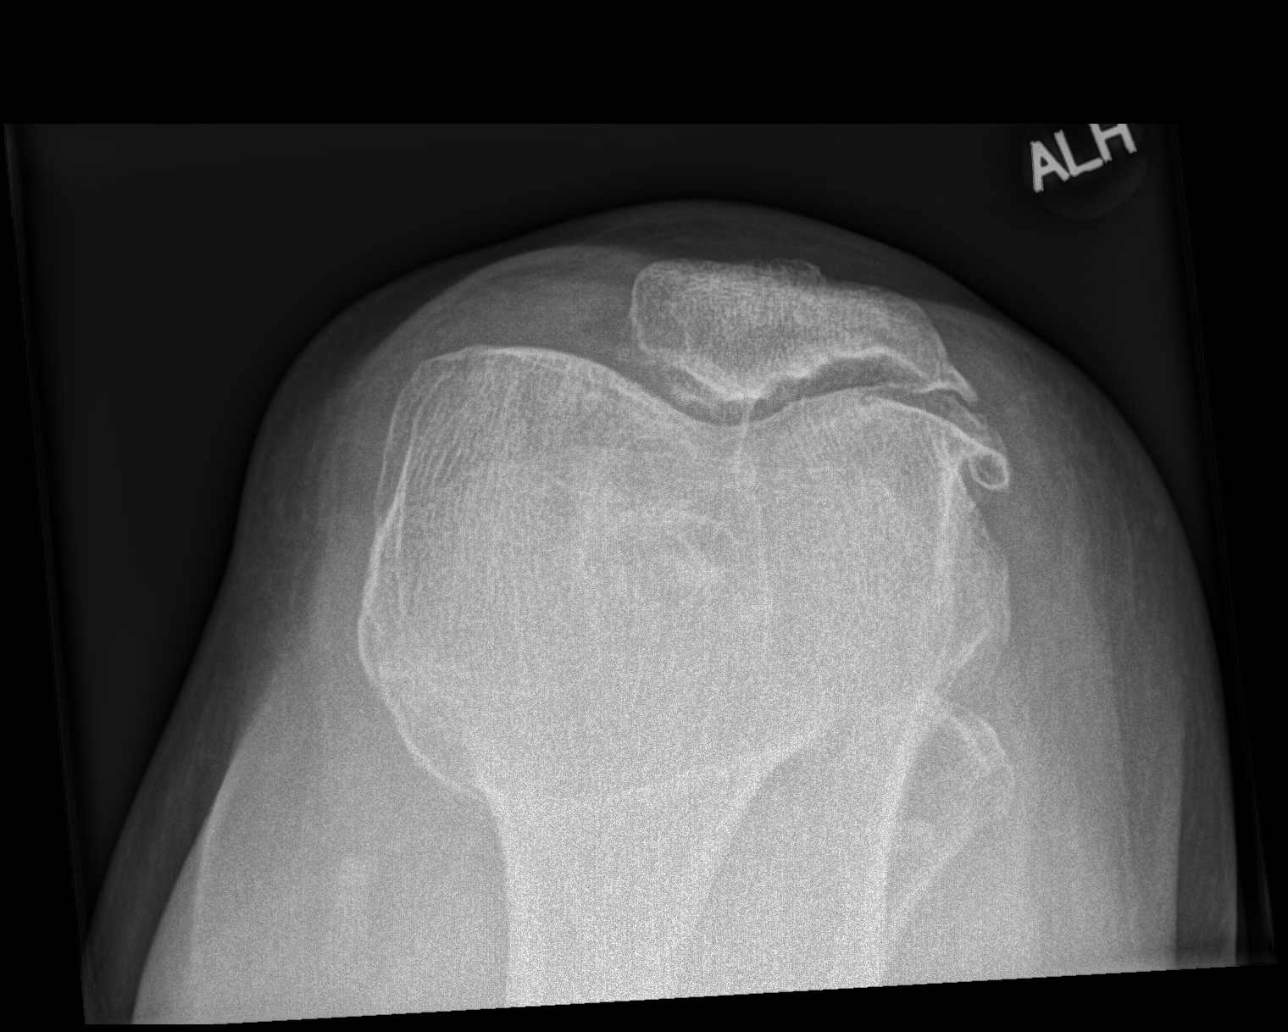

[4 of 4 positions shown; findings below may reference images not displayed]

FINDINGS: No recent fracture or dislocation is seen. Degenerative changes are
noted with bony spurs in medial, lateral and patellofemoral
compartments more so in the patellofemoral compartment. There is
soft tissue fullness in the suprapatellar bursa. There are no focal
lytic lesions.
IMPRESSION: No recent fracture or dislocation is seen in the right knee.
Degenerative changes are noted, more severe in the patellofemoral
compartment. Small effusion is seen in the suprapatellar bursa.

## 2023-06-30 ENCOUNTER — Other Ambulatory Visit: Payer: Self-pay | Admitting: Family Medicine

## 2023-06-30 DIAGNOSIS — Z1231 Encounter for screening mammogram for malignant neoplasm of breast: Secondary | ICD-10-CM

## 2023-08-21 DIAGNOSIS — L821 Other seborrheic keratosis: Secondary | ICD-10-CM | POA: Diagnosis not present

## 2023-08-21 DIAGNOSIS — Z872 Personal history of diseases of the skin and subcutaneous tissue: Secondary | ICD-10-CM | POA: Diagnosis not present

## 2023-08-21 DIAGNOSIS — D225 Melanocytic nevi of trunk: Secondary | ICD-10-CM | POA: Diagnosis not present

## 2023-08-21 DIAGNOSIS — L814 Other melanin hyperpigmentation: Secondary | ICD-10-CM | POA: Diagnosis not present

## 2023-10-10 ENCOUNTER — Ambulatory Visit
Admission: RE | Admit: 2023-10-10 | Discharge: 2023-10-10 | Disposition: A | Discharge status: Home / Self Care | Payer: Medicare HMO | Source: Ambulatory Visit | Attending: Family Medicine | Admitting: Family Medicine

## 2023-10-10 DIAGNOSIS — Z1231 Encounter for screening mammogram for malignant neoplasm of breast: Secondary | ICD-10-CM

## 2023-11-14 ENCOUNTER — Encounter: Payer: Self-pay | Admitting: Internal Medicine

## 2024-01-01 ENCOUNTER — Ambulatory Visit
Admission: RE | Admit: 2024-01-01 | Discharge: 2024-01-01 | Disposition: A | Discharge status: Home / Self Care | Source: Ambulatory Visit | Attending: Family Medicine | Admitting: Family Medicine

## 2024-01-01 VITALS — BP 149/86 | HR 89 | Temp 98.4°F | Resp 16

## 2024-01-01 DIAGNOSIS — B349 Viral infection, unspecified: Secondary | ICD-10-CM | POA: Diagnosis not present

## 2024-01-01 DIAGNOSIS — H1033 Unspecified acute conjunctivitis, bilateral: Secondary | ICD-10-CM | POA: Diagnosis not present

## 2024-01-01 LAB — POC COVID19/FLU A&B COMBO
Covid Antigen, POC: NEGATIVE
Influenza A Antigen, POC: NEGATIVE
Influenza B Antigen, POC: NEGATIVE

## 2024-01-01 LAB — POCT RAPID STREP A (OFFICE): Rapid Strep A Screen: NEGATIVE

## 2024-01-01 MED ORDER — POLYMYXIN B-TRIMETHOPRIM 10000-0.1 UNIT/ML-% OP SOLN: 1.0000 [drp] | OPHTHALMIC | 0 refills | Status: AC

## 2024-01-01 MED ORDER — PROMETHAZINE-DM 6.25-15 MG/5ML PO SYRP: 5.0000 mL | ORAL_SOLUTION | Freq: Two times a day (BID) | ORAL | 0 refills | Status: DC | PRN

## 2024-01-01 NOTE — Discharge Instructions (Addendum)
 You have tested negative for COVID, flu, and strep throat.  You may change your viral illness symptoms with Promethazine DM for cough.  Please of this medication will make you drowsy.  Do not drink alcohol or drive on this medication.  Lots of rest and fluids.  There are different types of pinkeye including viral.  You may start Polytrim antibiotic eyedrops as prescribed.  If your symptoms do not improve over the next 2 days you may stop this and start over-the-counter antihistamine eyedrops such as Clear Eyes.  Warm compresses to the eyes as needed.  Please follow-up with your PCP at your scheduled appointment in 2 days for recheck.  Please go to the ER if you develop any worsening symptoms.  Hope you feel better soon!

## 2024-01-01 NOTE — ED Provider Notes (Addendum)
 [facility]    CSN: [id number] Arrival date & time: 01/01/24  1003      History   Chief Complaint Chief Complaint  Patient presents with   Cough    Cold, congestion, hoarse throat - Entered by patient   Eye Problem   Ear Pain    HPI Marilyn Dixon is a 73 y.o. female  presents for evaluation of URI symptoms for 4 days. Patient reports associated symptoms of cough, congestion, ear pain, sore throat. Denies N/V/D, fevers, body aches, shortness of breath. Patient does not have a hx of asthma. Patient is not an active smoker.   Reports no known sick contacts but was at a large event over the weekend.  Pt has taken Mucinex and throat sprays OTC for symptoms. Pt has no other concerns at this time.    Cough Associated symptoms: ear pain and sore throat   Eye Problem   Past Medical History:  Diagnosis Date   Allergy    Back pain    Dyslipidemia    Glaucoma    High cholesterol    Hx of adenomatous colonic polyps    Menopause    Seasonal affective disorder [facility])     Patient Active Problem List   Diagnosis Date Noted   S/P TKR (total knee replacement), right 05/21/2023   Osteopenia 09/21/2021   Hot flashes 05/10/2021   Status post cataract extraction 12/11/2018   Gastroesophageal reflux disease without esophagitis 12/07/2017   History of colonic polyps 02/19/2016   Seasonal affective disorder (HCC) 08/17/2015   Glaucoma 07/26/2011   Allergic rhinitis due to pollen 07/26/2011   Hyperlipidemia LDL goal <100 07/26/2011    Past Surgical History:  Procedure Laterality Date   COLONOSCOPY  2006/2011   Schooler,Santogade   TOTAL KNEE ARTHROPLASTY Right 03/21/2023    OB History   No obstetric history on file.      Home Medications    Prior to Admission medications   Medication Sig Start Date End Date Taking? Authorizing Provider  promethazine-dextromethorphan (PROMETHAZINE-DM) 6.25-15 MG/5ML syrup Take 5 mLs by mouth 2 (two) times daily as needed for  cough. 01/01/24  Yes Radford Pax, NP  trimethoprim-polymyxin b (POLYTRIM) ophthalmic solution Place 1 drop into both eyes every 4 (four) hours while awake for 7 days. 01/01/24 01/08/24 Yes Radford Pax, NP  atorvastatin (LIPITOR) 10 MG tablet Take 1 tablet (10 mg total) by mouth daily. 05/23/23   Ronnald Nian, MD  Black Cohosh 540 MG CAPS Take 1 capsule by mouth.    [provider]  cetirizine (ZYRTEC) 10 MG tablet Take 10 mg by mouth daily.    [provider]  clonazePAM (KLONOPIN) 0.5 MG tablet TAKE 1 TABLET BY MOUTH TWICE A DAY AS NEEDED FOR ANXIETY 05/11/23   Ronnald Nian, MD  Melatonin 5 MG CAPS Take 1 capsule by mouth at bedtime.    [provider]  meloxicam (MOBIC) 7.5 MG tablet TAKE 1 TABLET BY MOUTH EVERY DAY 04/14/22   Persons, [place], PA  Multiple Vitamins-Minerals (HAIR SKIN AND NAILS FORMULA PO) Take 3 tablets by mouth daily.    [provider]  sertraline (ZOLOFT) 100 MG tablet Take 1 tablet (100 mg total) by mouth daily. 05/23/23   Ronnald Nian, MD    Family History History reviewed. No pertinent family history.  Social History Social History   Tobacco Use   Smoking status: Never   Smokeless tobacco: Never  Vaping Use  Vaping status: Never Used  Substance Use Topics   Alcohol use: Yes    Alcohol/week: 3.0 standard drinks of alcohol    Types: 3 drink(s) per week   Drug use: No     Allergies   Sulfa antibiotics   Review of Systems Review of Systems  HENT:  Positive for congestion, ear pain and sore throat.   Respiratory:  Positive for cough.      Physical Exam Triage Vital Signs ED Triage Vitals [01/01/24 1014]  Encounter Vitals Group     BP (!) 149/86     Systolic BP Percentile      Diastolic BP Percentile      Pulse Rate 89     Resp 16     Temp 98.4 F (36.9 C)     Temp Source Oral     SpO2 95 %     Weight      Height      Head Circumference      Peak Flow      Pain Score 0     Pain Loc      Pain  Education      Exclude from Growth Chart    No data found.  Updated Vital Signs BP (!) 149/86 (BP Location: Left Arm)   Pulse 89   Temp 98.4 F (36.9 C) (Oral)   Resp 16   SpO2 95%   Visual Acuity Right Eye Distance:   Left Eye Distance:   Bilateral Distance:    Right Eye Near:   Left Eye Near:    Bilateral Near:     Physical Exam Vitals and nursing note reviewed.  Constitutional:      General: Marilyn Dixon is not in acute distress.    Appearance: Marilyn Dixon is well-developed. Marilyn Dixon is not ill-appearing.  HENT:     Head: Normocephalic and atraumatic.     Right Ear: Tympanic membrane and ear canal normal.     Left Ear: Tympanic membrane and ear canal normal.     Nose: Congestion present.     Mouth/Throat:     Mouth: Mucous membranes are moist.     Pharynx: Oropharynx is clear. Uvula midline. Posterior oropharyngeal erythema present.     Tonsils: No tonsillar exudate or tonsillar abscesses.  Eyes:     General: Lids are normal.        Right eye: No foreign body, discharge or hordeolum.        Left eye: No foreign body, discharge or hordeolum.     Conjunctiva/sclera:     Right eye: Right conjunctiva is injected. No chemosis, exudate or hemorrhage.    Left eye: Left conjunctiva is injected. No chemosis, exudate or hemorrhage.    Pupils: Pupils are equal, round, and reactive to light.  Cardiovascular:     Rate and Rhythm: Normal rate and regular rhythm.     Heart sounds: Normal heart sounds.  Pulmonary:     Effort: Pulmonary effort is normal.     Breath sounds: Normal breath sounds.  Musculoskeletal:     Cervical back: Normal range of motion and neck supple.  Lymphadenopathy:     Cervical: No cervical adenopathy.  Skin:    General: Skin is warm and dry.  Neurological:     General: No focal deficit present.     Mental Status: Marilyn Dixon is alert and oriented to person, place, and time.  Psychiatric:        Mood and Affect: Mood normal.  Behavior: Behavior normal.      UC  Treatments / Results  Labs (all labs ordered are listed, but only abnormal results are displayed) Labs Reviewed  POCT RAPID STREP A (OFFICE)  POC COVID19/FLU A&B COMBO   Comprehensive metabolic panel Order: [id number]  Status: Final result     Next appt: 01/03/2024 at 10:45 AM in Family Medicine (Sharlot Gowda, MD)     Dx: Preop testing   Test Result Released: Yes (seen)     Messages: Seen   0 Result Notes     1 Patient Communication          Component Ref Range & Units (hover) 10 mo ago 1 yr ago 2 yr ago 4 yr ago 5 yr ago 6 yr ago 7 yr ago  Glucose 100 High  85 92 R 95 R 96 R 96 R 100 High  R  BUN 15 12 14 16 13 14 13  R  Creatinine, Ser 0.89 0.85 0.77 0.98 0.90 0.85 0.80 R, CM  eGFR 69 73 82      BUN/Creatinine Ratio 17 14 18 16 14 16    Sodium 142 143 141 143 143 144 141 R  Potassium 5.0 4.7 4.0 4.6 4.9 4.5 4.2 R  Chloride 104 106 103 103 104 105 107 R  CO2 23 22 25 24 24 24 26  R  Calcium 9.9 9.8 9.8 10.8 High  9.9 10.0 9.4 R  Total Protein 6.6 6.7 7.1 7.0 6.3 6.9 6.5 R  Albumin 4.5 4.4 4.6 R 4.7 4.4 4.6 R 4.1 R  Globulin, Total 2.1 2.3 2.5 2.3 1.9 2.3   Albumin/Globulin Ratio 2.1 1.9 1.8 2.0 2.3 High  2.0   Bilirubin Total 0.5 0.4 0.4 0.5 0.4 0.5 0.6 R  Alkaline Phosphatase 171 High  146 High  162 High  140 High  R 125 High  R 136 High  R 113 R  AST 23 21 21 26 19  35 23 R  ALT 18 18 20 21 16 31 19  R  Resulting Agency [facility]         Narrative Performed byVerdell Carmine Performed at:  [address] [employer] [address] [place], [place]  [id number] Lab Director: Jolene Schimke MD, Phone:  [phone]  Specimen Collected: 02/15/23 09:05 Last Resulted: 02/15/23 22:35       EKG   Radiology No results found.  Procedures Procedures (including critical care time)  Medications Ordered in UC Medications - No data to display  Initial Impression / Assessment and Plan / UC Course  I have reviewed the triage vital  signs and the nursing notes.  Pertinent labs & imaging results that were available during my care of the patient were reviewed by me and considered in my medical decision making (see chart for details).     Reviewed exam and symptoms with patient.  No red flags.  Negative rapid flu COVID and strep testing.  Discussed viral illness and symptomatic treatment.  Promethazine DM as needed for cough, side effect profile reviewed.  Discussed different types of conjunctivitis including viral versus bacterial.  Patient reports sick does not like drainage from both eyes.  Will cover for potential bacterial process with Polytrim.  Advised if no improvement after 2 days may stop this and use over-the-counter antihistamine eyedrops such as Clear Eyes for viral conjunctivitis.  Warm compresses to the eyes as needed.  Encourage lots of rest and fluids.  Patient follow-up with her PCP at her  scheduled appointment in 2 days for recheck.  Strict ER precautions reviewed and patient verbalized understanding. Final Clinical Impressions(s) / UC Diagnoses   Final diagnoses:  Viral illness  Acute conjunctivitis of both eyes, unspecified acute conjunctivitis type     Discharge Instructions      You have tested negative for COVID, flu, and strep throat.  You may change your viral illness symptoms with Promethazine DM for cough.  Please of this medication will make you drowsy.  Do not drink alcohol or drive on this medication.  Lots of rest and fluids.  There are different types of pinkeye including viral.  You may start Polytrim antibiotic eyedrops as prescribed.  If your symptoms do not improve over the next 2 days you may stop this and start over-the-counter antihistamine eyedrops such as Clear Eyes.  Warm compresses to the eyes as needed.  Please follow-up with your PCP at your scheduled appointment in 2 days for recheck.  Please go to the ER if you develop any worsening symptoms.  Hope you feel better soon!     ED  Prescriptions     Medication Sig Dispense Auth. Provider   trimethoprim-polymyxin b (POLYTRIM) ophthalmic solution Place 1 drop into both eyes every 4 (four) hours while awake for 7 days. 10 mL Azusena Erlandson, Jodi R, NP   promethazine-dextromethorphan (PROMETHAZINE-DM) 6.25-15 MG/5ML syrup Take 5 mLs by mouth 2 (two) times daily as needed for cough. 118 mL Lakeith Careaga, Jodi R, NP      PDMP not reviewed this encounter.   Alleen Arbour, NP 01/01/24 1059    Alleen Arbour, NP 01/01/24 1100

## 2024-01-01 NOTE — ED Triage Notes (Signed)
 Pt present with c/o a cough that started Friday, sore throat developed on Saturday. Pt states her eyes have been irritated and drainage. Has taken OTC medicine for relief.

## 2024-01-03 ENCOUNTER — Ambulatory Visit: Admitting: Family Medicine

## 2024-01-03 VITALS — BP 116/84 | HR 93 | Temp 97.9°F | Wt 150.6 lb

## 2024-01-03 DIAGNOSIS — B309 Viral conjunctivitis, unspecified: Secondary | ICD-10-CM

## 2024-01-03 DIAGNOSIS — H6501 Acute serous otitis media, right ear: Secondary | ICD-10-CM

## 2024-01-03 MED ORDER — AMOXICILLIN-POT CLAVULANATE 875-125 MG PO TABS: 1.0000 | ORAL_TABLET | Freq: Two times a day (BID) | ORAL | 0 refills | Status: DC

## 2024-01-03 NOTE — Progress Notes (Signed)
   Subjective:    Patient ID: Marilyn Dixon, female    DOB: Aug 21, 1951, 73 y.o.   MRN: [id number]  HPI  Sore throat. Hoarseness, congestion, and cough. Eye discharge on Sunday. Went to urgent care on Monday tested negative for flu and COVID. Gave her promethazine for cough. Said a type viral pink eye. Prescribed eye drops. Ear pain as well. Both ears when blowing nose is stopped up left one is opening and closing right one is completely closed.     Review of Systems     Objective:    Physical Exam Alert and in no distress. Tympanic membrane on the left is normal and on the right is dull.  Canals are normal.  Conjunctiva are injected.  Pharyngeal area is normal. Neck is supple without adenopathy or thyromegaly. Cardiac exam shows a regular sinus rhythm without murmurs or gallops. Lungs are clear to auscultation.        Assessment & Plan:  Non-recurrent acute serous otitis media of right ear - Plan: amoxicillin-clavulanate (AUGMENTIN) 875-125 MG tablet  Acute viral conjunctivitis of both eyes I think she could easily have a combined viral and bacterial infection since she does have evidence of conjunctivitis with clear drainage and the right tympanic membrane is dull.  I will give her Augmentin and have her continue with her other medications.

## 2024-01-05 ENCOUNTER — Encounter (HOSPITAL_BASED_OUTPATIENT_CLINIC_OR_DEPARTMENT_OTHER): Payer: Self-pay

## 2024-01-05 ENCOUNTER — Emergency Department (HOSPITAL_BASED_OUTPATIENT_CLINIC_OR_DEPARTMENT_OTHER)

## 2024-01-05 ENCOUNTER — Emergency Department (HOSPITAL_BASED_OUTPATIENT_CLINIC_OR_DEPARTMENT_OTHER)
Admission: EM | Admit: 2024-01-05 | Discharge: 2024-01-05 | Disposition: A | Discharge status: Home / Self Care | Attending: Emergency Medicine | Admitting: Emergency Medicine

## 2024-01-05 ENCOUNTER — Ambulatory Visit
Admission: RE | Admit: 2024-01-05 | Discharge: 2024-01-05 | Disposition: A | Discharge status: Other Acute Inpatient Hospital | Source: Ambulatory Visit | Attending: Family Medicine | Admitting: Family Medicine

## 2024-01-05 VITALS — BP 195/105 | HR 78 | Temp 97.9°F | Resp 16

## 2024-01-05 DIAGNOSIS — H6993 Unspecified Eustachian tube disorder, bilateral: Secondary | ICD-10-CM

## 2024-01-05 DIAGNOSIS — Z79899 Other long term (current) drug therapy: Secondary | ICD-10-CM | POA: Insufficient documentation

## 2024-01-05 DIAGNOSIS — R590 Localized enlarged lymph nodes: Secondary | ICD-10-CM | POA: Diagnosis not present

## 2024-01-05 DIAGNOSIS — R059 Cough, unspecified: Secondary | ICD-10-CM | POA: Diagnosis not present

## 2024-01-05 DIAGNOSIS — I10 Essential (primary) hypertension: Secondary | ICD-10-CM | POA: Diagnosis not present

## 2024-01-05 DIAGNOSIS — R030 Elevated blood-pressure reading, without diagnosis of hypertension: Secondary | ICD-10-CM

## 2024-01-05 DIAGNOSIS — H9203 Otalgia, bilateral: Secondary | ICD-10-CM | POA: Diagnosis present

## 2024-01-05 DIAGNOSIS — R079 Chest pain, unspecified: Secondary | ICD-10-CM | POA: Diagnosis not present

## 2024-01-05 DIAGNOSIS — R0989 Other specified symptoms and signs involving the circulatory and respiratory systems: Secondary | ICD-10-CM | POA: Diagnosis not present

## 2024-01-05 DIAGNOSIS — H66003 Acute suppurative otitis media without spontaneous rupture of ear drum, bilateral: Secondary | ICD-10-CM | POA: Diagnosis not present

## 2024-01-05 DIAGNOSIS — J029 Acute pharyngitis, unspecified: Secondary | ICD-10-CM | POA: Insufficient documentation

## 2024-01-05 LAB — CBC WITH DIFFERENTIAL/PLATELET
Abs Immature Granulocytes: 0.05 10*3/uL (ref 0.00–0.07)
Basophils Absolute: 0.1 10*3/uL (ref 0.0–0.1)
Basophils Relative: 1 %
Eosinophils Absolute: 0.2 10*3/uL (ref 0.0–0.5)
Eosinophils Relative: 2 %
HCT: 42.1 % (ref 36.0–46.0)
Hemoglobin: 14.5 g/dL (ref 12.0–15.0)
Immature Granulocytes: 1 %
Lymphocytes Relative: 23 %
Lymphs Abs: 2 10*3/uL (ref 0.7–4.0)
MCH: 30.2 pg (ref 26.0–34.0)
MCHC: 34.4 g/dL (ref 30.0–36.0)
MCV: 87.7 fL (ref 80.0–100.0)
Monocytes Absolute: 0.6 10*3/uL (ref 0.1–1.0)
Monocytes Relative: 6 %
Neutro Abs: 6.1 10*3/uL (ref 1.7–7.7)
Neutrophils Relative %: 67 %
Platelets: 262 10*3/uL (ref 150–400)
RBC: 4.8 MIL/uL (ref 3.87–5.11)
RDW: 12.8 % (ref 11.5–15.5)
WBC: 8.9 10*3/uL (ref 4.0–10.5)
nRBC: 0 % (ref 0.0–0.2)

## 2024-01-05 LAB — TROPONIN I (HIGH SENSITIVITY): Troponin I (High Sensitivity): 5 ng/L (ref <18)

## 2024-01-05 LAB — BASIC METABOLIC PANEL WITH GFR
Anion gap: 8 (ref 5–15)
BUN: 12 mg/dL (ref 8–23)
CO2: 25 mmol/L (ref 22–32)
Calcium: 9.4 mg/dL (ref 8.9–10.3)
Chloride: 108 mmol/L (ref 98–111)
Creatinine, Ser: 0.71 mg/dL (ref 0.44–1.00)
GFR, Estimated: >60 mL/min (ref >60)
Glucose, Bld: 90 mg/dL (ref 70–99)
Potassium: 4.2 mmol/L (ref 3.5–5.1)
Sodium: 141 mmol/L (ref 135–145)

## 2024-01-05 MED ORDER — LIDOCAINE VISCOUS HCL 2 % MT SOLN
15.0000 mL | Freq: Once | OROMUCOSAL | Status: AC
  Administered 2024-01-05: 15 mL via OROMUCOSAL
  Filled 2024-01-05: qty 15

## 2024-01-05 MED ORDER — AMLODIPINE BESYLATE 5 MG PO TABS: 5.0000 mg | ORAL_TABLET | Freq: Every day | ORAL | 0 refills | Status: DC

## 2024-01-05 MED ORDER — PREDNISONE 20 MG PO TABS: 40.0000 mg | ORAL_TABLET | Freq: Every day | ORAL | 0 refills | Status: AC

## 2024-01-05 MED ORDER — FLUTICASONE PROPIONATE 50 MCG/ACT NA SUSP
1.0000 | Freq: Every day | NASAL | 0 refills | Status: AC
Start: 2024-01-05 — End: ?

## 2024-01-05 MED ORDER — AMLODIPINE BESYLATE 5 MG PO TABS
5.0000 mg | ORAL_TABLET | Freq: Once | ORAL | Status: AC
  Administered 2024-01-05: 5 mg via ORAL
  Filled 2024-01-05: qty 1

## 2024-01-05 NOTE — ED Provider Notes (Addendum)
 Marilyn Dixon   CSN: [id number] Arrival date & time: 01/05/24  1543     History  Chief Complaint  Patient presents with   Otalgia    bilat   URI    Carmine Youngberg is a 73 y.o. female history of seasonal affective disorder, anxiety, allergies, dyslipidemia presented for right ear pain that has now spread to both ears along with sore throat and chest discomfort since Monday.  Patient has been seen twice and was placed on Augmentin  2 days ago but is still not feeling better.  Patient denies any fevers.  Patient does have decreased hearing from this.  Patient states she does not get ear infections.  Patient also states that she feels that she has a pressure on her chest but is unsure if this is her anxiety.  Patient does not have history of high blood pressure but at the urgent care had blood pressure in the 200s reportedly.  Patient denies any chest pain, vision changes, neck pain, new onset weakness or radiation of this chest pain, headache.  Patient is not currently on any antihypertensives.  Home Medications Prior to Admission medications   Medication Sig Start Date End Date Taking? Authorizing Provider  amLODipine  (NORVASC ) 5 MG tablet Take 1 tablet (5 mg total) by mouth daily. 01/05/24 02/04/24 Yes Rhyse Skowron, Arlin Benes, PA-C  amoxicillin -clavulanate (AUGMENTIN ) 875-125 MG tablet Take 1 tablet by mouth 2 (two) times daily. 01/03/24   Watson Hacking, MD  atorvastatin  (LIPITOR) 10 MG tablet Take 1 tablet (10 mg total) by mouth daily. 05/23/23   Watson Hacking, MD  Black Cohosh 540 MG CAPS Take 1 capsule by mouth.    [provider]  cetirizine (ZYRTEC) 10 MG tablet Take 10 mg by mouth daily.    [provider]  clonazePAM  (KLONOPIN ) 0.5 MG tablet TAKE 1 TABLET BY MOUTH TWICE A DAY AS NEEDED FOR ANXIETY 05/11/23   Watson Hacking, MD  fluticasone  (FLONASE ) 50 MCG/ACT nasal spray Place 1 spray into both nostrils daily.  01/05/24   Mayer, Jodi R, NP  Melatonin 5 MG CAPS Take 1 capsule by mouth at bedtime.    [provider]  meloxicam  (MOBIC ) 7.5 MG tablet TAKE 1 TABLET BY MOUTH EVERY DAY 04/14/22   Persons, Norma Beckers, PA  Multiple Vitamins-Minerals (HAIR SKIN AND NAILS FORMULA PO) Take 3 tablets by mouth daily. Patient not taking: Reported on 01/03/2024    [provider]  predniSONE  (DELTASONE ) 20 MG tablet Take 2 tablets (40 mg total) by mouth daily with breakfast for 5 days. 01/05/24 01/10/24  Mayer, Jodi R, NP  promethazine -dextromethorphan (PROMETHAZINE -DM) 6.25-15 MG/5ML syrup Take 5 mLs by mouth 2 (two) times daily as needed for cough. 01/01/24   Mayer, Jodi R, NP  sertraline  (ZOLOFT ) 100 MG tablet Take 1 tablet (100 mg total) by mouth daily. 05/23/23   Watson Hacking, MD  trimethoprim -polymyxin b  (POLYTRIM ) ophthalmic solution Place 1 drop into both eyes every 4 (four) hours while awake for 7 days. 01/01/24 01/08/24  Mayer, Jodi R, NP      Allergies    Sulfa antibiotics    Review of Systems   Review of Systems  HENT:  Positive for ear pain.     Physical Exam Updated Vital Signs BP (!) 211/99   Pulse 84   Temp 97.6 F (36.4 C)   SpO2 97%  Physical Exam Constitutional:      General: She is not  in acute distress. HENT:     Head: Normocephalic and atraumatic.     Jaw: There is normal jaw occlusion.     Salivary Glands: Right salivary gland is not diffusely enlarged or tender. Left salivary gland is not diffusely enlarged or tender.     Comments: No facial swelling    Right Ear: Hearing, ear canal and external ear normal.     Left Ear: Hearing, ear canal and external ear normal.     Ears:     Comments: Right tympanic membrane appears erythematous and bulging Left tympanic membrane does appear erythematous without bulging No signs of perforation No mastoid tenderness, fluctuance, erythema or warmth    Nose: Nose normal.     Mouth/Throat:     Lips: Pink.     Mouth: Mucous  membranes are moist.     Pharynx: Oropharynx is clear. Uvula midline.     Comments: No oral floor swelling Tolerating secretions No purulent drainage no No PTA noted No muffled voice noted Eyes:     Extraocular Movements: Extraocular movements intact.     Conjunctiva/sclera: Conjunctivae normal.     Pupils: Pupils are equal, round, and reactive to light.  Neck:     Comments: No neck swelling Cardiovascular:     Rate and Rhythm: Normal rate and regular rhythm.     Pulses: Normal pulses.     Heart sounds: Normal heart sounds.  Pulmonary:     Effort: Pulmonary effort is normal. No respiratory distress.     Breath sounds: Normal breath sounds.  Musculoskeletal:     Cervical back: Normal range of motion and neck supple. No rigidity or tenderness.  Lymphadenopathy:     Cervical: Cervical adenopathy (Submandibular bilaterally) present.  Neurological:     Mental Status: She is alert.     ED Results / Procedures / Treatments   Labs (all labs ordered are listed, but only abnormal results are displayed) Labs Reviewed  BASIC METABOLIC PANEL WITH GFR  CBC WITH DIFFERENTIAL/PLATELET  TROPONIN I (HIGH SENSITIVITY)    EKG None  Radiology No results found.  Procedures Procedures    Medications Ordered in ED Medications  lidocaine  (XYLOCAINE ) 2 % viscous mouth solution 15 mL (15 mLs Mouth/Throat Given 01/05/24 1717)  amLODipine  (NORVASC ) tablet 5 mg (5 mg Oral Given 01/05/24 1811)    ED Course/ Medical Decision Making/ A&P                                 Medical Decision Making Amount and/or Complexity of Data Reviewed Labs: ordered. Radiology: ordered.  Risk Prescription drug management.   Marilyn Dixon 73 y.o. presented today for otalgia, chest pain, sore throat.  Working DDx that I considered at this time includes, but not limited to, AOM, otitis externa, URI, necrotizing otitis externa foreign body, auricular hematoma/perichondritis, membrane perforation,  mastoiditis, anxiety, ACS, hypertensive emergency/urgency, AKI, uncontrolled hypertension.  R/o DDx: otitis externa, URI, necrotizing otitis externa foreign body, auricular hematoma/perichondritis, membrane perforation, mastoiditis, anxiety, ACS, hypertensive emergency/urgency, AKI: These are considered less likely due to history of present illness, physical exam, labs/imaging findings  Review of prior external notes: 05/31/2023 office visit  Unique Tests and My Independent Interpretation:  CBC: Unremarkable NUU:[id number] Troponin:Unremarkable Chest x-ray:Unremarkable EKG: Sinus 74 bpm, no ST elevations or depressions noted, no signs of heart strain  Social Determinants of Health: none  Discussion with Independent Historian:  Husband  Discussion of Management of  Tests: None  Risk: Medium: prescription drug management  Risk Stratification Score: None  Staffed with Plunkett, MD  Plan: On exam patient was no acute distress was noted to be hypertensive at 211 systolic.  Patient states that she sometimes has a pressure on her chest and that this all began when her ear started hurting.  Husband and patient thinks that this could be related to her anxiety.  Patient is not currently on any antihypertensives however upon chart review she has been hypertensive a few times and so most likely needs to be placed on blood pressure meds and after speaking with the patient we will further evaluate her hypertension and chest pain however I do think this is related to her URI mixed in with anxiety.  Patient has no cardiac history.  Patient is not endorsing any shortness of breath or other red flag symptoms such as headache, vision changes.  On exam patient does have erythematous and bulging right tympanic membrane with the left tympanic membrane appearing erythematous indicative of infection.  Patient has been on the Augmentin  for 2 days and was prescribed Flonase  along with prednisone  by the urgent care  today that she has not yet taken.  I spoke to the patient and husband about how it is too early to say if the antibiotics have failed yet and that they will need to continue taking these to which they are receptive of.  Will get labs and imaging.  Patient only needs 1 troponin as symptoms have been going on since Monday and at this point troponin should be elevated.  Labs and imaging are reassuring.  Patient was reevaluated.  By my independent interpretation of the patient's cardiac monitor, the monitor shows normal sinus.  Patient blood pressure is systolic 203.  Patient does not have any chest pain at this time but states she does feel better with your ear after the viscous lidocaine .  Labs and imaging are all reassuring with a EKG here.  Will give 1 dose of Norvasc  here and prescribe a few days worth but will have her follow-up closely with her primary care provider for long-term management and reevaluation of her hypertension as this is unusual.  Patient is currently not in any distress and is okay with being discharged and following up closely.  We did discuss extensive return precautions.  Patient was given return precautions. Patient stable for discharge at this time.  Patient verbalized understanding of plan.  This chart was dictated using voice recognition software.  Despite best efforts to proofread,  errors can occur which can change the documentation meaning        Final Clinical Impression(s) / ED Diagnoses Final diagnoses:  Non-recurrent acute suppurative otitis media of both ears without spontaneous rupture of tympanic membranes  Uncontrolled hypertension    Rx / DC Orders ED Discharge Orders          Ordered    amLODipine  (NORVASC ) 5 MG tablet  Daily        01/05/24 1806              Elex Grimmer 01/05/24 1833    Denese Finn, PA-C 01/05/24 Alvan August, MD 01/05/24 [phone]

## 2024-01-05 NOTE — Discharge Instructions (Addendum)
 Please follow-up also with your primary care provider regards [facility] ER visit.  Today your blood pressure was noted to be elevated we started you on blood pressure meds.  Please take these as prescribed.  Please take your blood pressure daily and keep a log so when you speak with your primary care provider they can review the data.  If you begin having chest pain, vision changes, headache, shortness of breath or other changes or worsening of symptoms please return to the ER.  Your exam also does show that you have an ear infection and at this time the antibiotics may not have had time to take effect fully so please take these along with the prednisone  and Flonase  prescribed to you.  Please take Tylenol every 6 as needed for pain.  If symptoms change or worsen please return to ER.

## 2024-01-05 NOTE — ED Provider Notes (Addendum)
 [facility]    CSN: [id number] Arrival date & time: 01/05/24  1357      History   Chief Complaint Chief Complaint  Patient presents with   Otalgia    HPI Marilyn Dixon is a 73 y.o. female presents for ear pain.  Patient was initially seen in urgent care on 4/14 for 4 days of URI symptoms.  She had a negative rapid strep, flu, COVID testing.  She was prescribed Promethazine  DM for cough as well as some Polytrim  for possible bacterial conjunctivitis.  She did go see her PCP on 4/16 for her ear pain.  She was started on Augmentin  for acute serous otitis of the right ear.  She continues to report right ear pain.  States she is taken 5 doses of the Augmentin  with minimal improvement.  No fevers.  Does have a cough but states this is better.  No other concerns at this time.  Otalgia Associated symptoms: congestion and cough     Past Medical History:  Diagnosis Date   Allergy    Back pain    Dyslipidemia    Glaucoma    High cholesterol    Hx of adenomatous colonic polyps    Menopause    Seasonal affective disorder [facility])     Patient Active Problem List   Diagnosis Date Noted   S/P TKR (total knee replacement), right 05/21/2023   Osteopenia 09/21/2021   Hot flashes 05/10/2021   Status post cataract extraction 12/11/2018   Gastroesophageal reflux disease without esophagitis 12/07/2017   History of colonic polyps 02/19/2016   Seasonal affective disorder (HCC) 08/17/2015   Glaucoma 07/26/2011   Allergic rhinitis due to pollen 07/26/2011   Hyperlipidemia LDL goal <100 07/26/2011    Past Surgical History:  Procedure Laterality Date   COLONOSCOPY  2006/2011   Schooler,Santogade   TOTAL KNEE ARTHROPLASTY Right 03/21/2023    OB History   No obstetric history on file.      Home Medications    Prior to Admission medications   Medication Sig Start Date End Date Taking? Authorizing Provider  fluticasone  (FLONASE ) 50 MCG/ACT nasal spray Place 1 spray into  both nostrils daily. 01/05/24  Yes Naszir Cott, Jodi R, NP  predniSONE  (DELTASONE ) 20 MG tablet Take 2 tablets (40 mg total) by mouth daily with breakfast for 5 days. 01/05/24 01/10/24 Yes Kassidy Frankson, Jodi R, NP  amoxicillin -clavulanate (AUGMENTIN ) 875-125 MG tablet Take 1 tablet by mouth 2 (two) times daily. 01/03/24   Watson Hacking, MD  atorvastatin  (LIPITOR) 10 MG tablet Take 1 tablet (10 mg total) by mouth daily. 05/23/23   Watson Hacking, MD  Black Cohosh 540 MG CAPS Take 1 capsule by mouth.    [provider]  cetirizine (ZYRTEC) 10 MG tablet Take 10 mg by mouth daily.    [provider]  clonazePAM  (KLONOPIN ) 0.5 MG tablet TAKE 1 TABLET BY MOUTH TWICE A DAY AS NEEDED FOR ANXIETY 05/11/23   Watson Hacking, MD  Melatonin 5 MG CAPS Take 1 capsule by mouth at bedtime.    [provider]  meloxicam  (MOBIC ) 7.5 MG tablet TAKE 1 TABLET BY MOUTH EVERY DAY 04/14/22   Persons, Norma Beckers, PA  Multiple Vitamins-Minerals (HAIR SKIN AND NAILS FORMULA PO) Take 3 tablets by mouth daily. Patient not taking: Reported on 01/03/2024    [provider]  promethazine -dextromethorphan (PROMETHAZINE -DM) 6.25-15 MG/5ML syrup Take 5 mLs by mouth 2 (two) times daily as needed for cough. 01/01/24   Zettie Hillock,  Jodi R, NP  sertraline  (ZOLOFT ) 100 MG tablet Take 1 tablet (100 mg total) by mouth daily. 05/23/23   Lalonde, John C, MD  trimethoprim -polymyxin b  (POLYTRIM ) ophthalmic solution Place 1 drop into both eyes every 4 (four) hours while awake for 7 days. 01/01/24 01/08/24  Alleen Arbour, NP    Family History History reviewed. No pertinent family history.  Social History Social History   Tobacco Use   Smoking status: Never   Smokeless tobacco: Never  Vaping Use   Vaping status: Never Used  Substance Use Topics   Alcohol use: Yes    Alcohol/week: 3.0 standard drinks of alcohol    Types: 3 drink(s) per week   Drug use: No     Allergies   Sulfa antibiotics   Review of Systems Review of  Systems  HENT:  Positive for congestion and ear pain.   Respiratory:  Positive for cough.      Physical Exam Triage Vital Signs ED Triage Vitals  Encounter Vitals Group     BP 01/05/24 1411 (S) (!) 196/93     Systolic BP Percentile --      Diastolic BP Percentile --      Pulse Rate 01/05/24 1411 78     Resp 01/05/24 1411 16     Temp 01/05/24 1411 97.9 F (36.6 C)     Temp src --      SpO2 01/05/24 1411 98 %     Weight --      Height --      Head Circumference --      Peak Flow --      Pain Score 01/05/24 1408 0     Pain Loc --      Pain Education --      Exclude from Growth Chart --    No data found.  Updated Vital Signs BP (!) 195/105 (BP Location: Right Arm)   Pulse 78   Temp 97.9 F (36.6 C)   Resp 16   SpO2 98%   Visual Acuity Right Eye Distance:   Left Eye Distance:   Bilateral Distance:    Right Eye Near:   Left Eye Near:    Bilateral Near:     Physical Exam Vitals and nursing note reviewed.  Constitutional:      General: She is not in acute distress.    Appearance: She is well-developed. She is not ill-appearing.  HENT:     Head: Normocephalic and atraumatic.     Right Ear: Ear canal normal. No drainage or swelling. A middle ear effusion is present. Tympanic membrane is not erythematous or retracted.     Left Ear: Ear canal normal. No drainage or swelling. A middle ear effusion is present. Tympanic membrane is not erythematous or retracted.     Nose: Congestion present.     Mouth/Throat:     Mouth: Mucous membranes are moist.     Pharynx: Oropharynx is clear. Uvula midline. No oropharyngeal exudate or posterior oropharyngeal erythema.     Tonsils: No tonsillar exudate or tonsillar abscesses.  Eyes:     Conjunctiva/sclera: Conjunctivae normal.     Pupils: Pupils are equal, round, and reactive to light.  Cardiovascular:     Rate and Rhythm: Normal rate and regular rhythm.     Heart sounds: Normal heart sounds.  Pulmonary:     Effort: Pulmonary  effort is normal.     Breath sounds: Normal breath sounds. No wheezing or rhonchi.  Musculoskeletal:  Cervical back: Normal range of motion and neck supple.  Lymphadenopathy:     Cervical: No cervical adenopathy.  Skin:    General: Skin is warm and dry.  Neurological:     General: No focal deficit present.     Mental Status: She is alert and oriented to person, place, and time.  Psychiatric:        Mood and Affect: Mood normal.        Behavior: Behavior normal.      UC Treatments / Results  Labs (all labs ordered are listed, but only abnormal results are displayed) Labs Reviewed - No data to display  EKG   Radiology No results found.  Procedures ED EKG  Date/Time: 01/05/2024 3:20 PM  Performed by: Alleen Arbour, NP Authorized by: Alleen Arbour, NP   ECG interpreted by ED Physician in the absence of a cardiologist: no   Rate:    ECG rate:  74   ECG rate assessment: normal   Rhythm:    Rhythm: sinus rhythm   Ectopy:    Ectopy: none   QRS:    QRS axis:  Normal ST segments:    ST segments:  Normal T waves:    T waves: normal    (including critical care time)  Medications Ordered in UC Medications - No data to display  Initial Impression / Assessment and Plan / UC Course  I have reviewed the triage vital signs and the nursing notes.  Pertinent labs & imaging results that were available during my care of the patient were reviewed by me and considered in my medical decision making (see chart for details).     Reviewed exam and symptoms with patient.  No red flags.  Discussed eustachian tube dysfunction.  Advised her to continue Augmentin  and the previously prescribed cough medicine as needed.  Will add on Flonase  and prednisone .  BP was noted to be elevated on intake and recheck.  Patient does endorse pain.  No history of hypertension.  She denies any chest pain, shortness of breath, headache, dizziness, visual changes.  Discussed this could be secondary to  the pain as well as she is anxious.  She has a way to check at home I advised her to do that when she gets home and keep a log over the next several days and take to her PCP if it remains elevated and she verbalized understanding.  BP review shows her blood pressures been well-controlled at previous visits.  Discussed rest fluids and PCP follow-up 2 to 3 days for recheck.  ER precautions reviewed and patient verbalized understanding.   Addendum 6160: While nurse was going over discharge instructions patient states she developed a midsternal chest pain that feels like a squeezing type pain that does not radiate.  No shortness of breath, dizziness, palpitations.  EKG was obtained and did not have any acute ST-T wave changes.  Discussed multiple causes of her chest pain including her elevated blood pressure but advised her I am unable to rule out any life-threatening causes of her symptoms in the setting.  Given her elevated blood pressure and chest pain I did advise she go to the emergency room for further evaluation and treatment.  I did recommend EMS transfer but she declined stating her husband will drive her.  Discussed risks of going POV including heart attack stroke permanent disability and/or death were reviewed and they verbalized understanding and still wished to proceed by private vehicle.  They were instructed to  pull over and call 911 for any worsening symptoms that occur in transit and her and her husband verbalized understanding. Final Clinical Impressions(s) / UC Diagnoses   Final diagnoses:  Eustachian tube dysfunction, bilateral  Elevated BP without diagnosis of hypertension  Chest pain, unspecified type     Discharge Instructions      Check your blood pressure at home and if it remains elevated contact your PCP for further evaluation of this.  Start Flonase  daily and prednisone  daily.  Continue the Augmentin  that was previously prescribed as well as the cough syrup as needed.  Lots of  rest and fluids.  Please follow-up with your PCP in 2 to 3 days for recheck.  Please go to the ER for any worsening symptoms which include but is not limited to chest pain, shortness of breath, headache, dizziness, visual changes.  Hope you feel better soon!     ED Prescriptions     Medication Sig Dispense Auth. Provider   fluticasone  (FLONASE ) 50 MCG/ACT nasal spray Place 1 spray into both nostrils daily. 15.8 mL Allye Hoyos, Jodi R, NP   predniSONE  (DELTASONE ) 20 MG tablet Take 2 tablets (40 mg total) by mouth daily with breakfast for 5 days. 10 tablet Jasmeen Fritsch, Jodi R, NP      PDMP not reviewed this encounter.   Alleen Arbour, NP 01/05/24 1502    Alleen Arbour, NP 01/05/24 1523    Alleen Arbour, NP 01/05/24 [phone]

## 2024-01-05 NOTE — ED Triage Notes (Signed)
 Pt at UC last Friday w URI, no improvement w medication. Seen at PCP Wednesday, dx w bilateral ear infection. Today, c/o pressure in bilat ears, feels throat is "more swollen, closed in." No relief w cough drops  Advises compliance w prescription abx, no relief after 5 days.

## 2024-01-05 NOTE — ED Notes (Signed)
 Patient is being discharged from the Urgent Care and sent to the Emergency Department via pov with husband . Per jodi fnp, patient is in need of higher level of care due to hypertension AND CHEST TIGHTNESS. Patient is aware and verbalizes understanding of plan of care.  Vitals:   01/05/24 1411 01/05/24 1456  BP: (S) (!) 196/93 (!) 195/105  Pulse: 78   Resp: 16   Temp: 97.9 F (36.6 C)   SpO2: 98%

## 2024-01-05 NOTE — Discharge Instructions (Addendum)
 Check your blood pressure at home and if it remains elevated contact your PCP for further evaluation of this.  Start Flonase  daily and prednisone  daily.  Continue the Augmentin  that was previously prescribed as well as the cough syrup as needed.  Lots of rest and fluids.  Please follow-up with your PCP in 2 to 3 days for recheck.  Please go to the ER for any worsening symptoms which include but is not limited to chest pain, shortness of breath, headache, dizziness, visual changes.  Hope you feel better soon!

## 2024-01-05 NOTE — ED Triage Notes (Signed)
 Pt presents to uc with co of cough and congestion was seen here on Monday. Pt reports she has been taking medication as prescribed. On Wednesday her pcp told her she had an ear infection and she has taken 5 doses ampicillin and has not had any improvement.

## 2024-01-05 NOTE — ED Notes (Signed)
 Attempted to dc pt and pt reported new onset of cp. Provider notified.

## 2024-01-07 NOTE — Progress Notes (Signed)
 Chief Complaint  Patient presents with   Follow-up    Seen at [facility] for congestion, ear pain and eye drainage. Would like to follow up and make sure all things are resolved. Ears are the only thing that are not better, somewhat better but not quite there yet. At [facility] her bp was 196/93 repeat was 205/95. She also mentioned that her right heel has been bothering her since 12/07/23. BP Sat 189/105 Sun 195/99.   Pt was seen by PCP 4/16 (as UC f/u), and started on augmentin  to treat R serous otitis media.  Went to [facility] 4/18 with ongoing complaints of ear pain. Advised her to continue Augmentin  and the previously prescribed cough medicine as needed.They prescribed Flonase  and prednisone . BP was noted to be elevated on intake and recheck. UC notes report she denied chest pain, shortness of breath, headache, dizziness, visual changes. Felt that pain as well as she is anxiety could contribute. She was advised to monitor BP at home, and f/u with PCP if it remained elevated.  She also went to ED 4/18, about 20 minutes after being discharged from UC, due to having some chest discomfort at that time.  She reported to ER right ear pain that has now spread to both ears along with sore throat and chest discomfort since Monday. Hadn't started the medications prescribed by UC yet.  ER doctor though elevated BP was related to her URI mixed in with anxiety.  No h/o HTN or cardiac history.  NSR on cardiac monitor. She had normal troponin, CBC, b-met.  BP remained elevated.  Given 2 dose of amlodipine  in ER, and prescribed 5 mg #30.  To f/u with PCP for long-term management and re-eval of BP. In ER, exam was noted to have erythematous and bulging right tympanic membrane with the left tympanic membrane appearing erythematous indicative of infection.   She noted improvement with viscous lidocaine  given for her throat.  BP Readings from Last 3 Encounters:  01/08/24 (!) 150/84  01/05/24 (!) 211/99  01/05/24 (!) 195/105    Chest  pressure had resolved by the time she left the ER, and didn't really notice any over the weekend. She continues to have pressure in the R ear, which varies.  Sometimes the L ear also has pressure, but not as severe. Denies ear pain. Some popping when she blows her nose.  Nasal congestion is mild, clear. Throat feels better, and is no longer coughing.  Denies side effects to the prednisone , other than some insomnia, which has been improving. Denies any diarrhea from the antibiotics. She is using Flonase  1 spray into each nostril daily, using gentle sniffs.  She is also complaining of left heel pain x 3 weeks, after walking a lot on vacation.  Still hurts some with walking, but is improving. .Doesn't have pain when she first gets out of bed. Concerned due to upcoming trip to [place].    PMH, PSH, SH reviewed  Outpatient Encounter Medications as of 01/08/2024  Medication Sig Note   amLODipine  (NORVASC ) 5 MG tablet Take 1 tablet (5 mg total) by mouth daily.    amoxicillin -clavulanate (AUGMENTIN ) 875-125 MG tablet Take 1 tablet by mouth 2 (two) times daily.    atorvastatin  (LIPITOR) 10 MG tablet Take 1 tablet (10 mg total) by mouth daily.    Black Cohosh 540 MG CAPS Take 1 capsule by mouth.    cetirizine (ZYRTEC) 10 MG tablet Take 10 mg by mouth daily.    clonazePAM  (KLONOPIN ) 0.5 MG tablet  TAKE 1 TABLET BY MOUTH TWICE A DAY AS NEEDED FOR ANXIETY 01/08/2024: As needed   fluticasone  (FLONASE ) 50 MCG/ACT nasal spray Place 1 spray into both nostrils daily.    Multiple Vitamins-Minerals (HAIR SKIN AND NAILS FORMULA PO) Take 3 tablets by mouth daily.    predniSONE  (DELTASONE ) 20 MG tablet Take 2 tablets (40 mg total) by mouth daily with breakfast for 5 days.    promethazine -dextromethorphan (PROMETHAZINE -DM) 6.25-15 MG/5ML syrup Take 5 mLs by mouth 2 (two) times daily as needed for cough. 01/08/2024: Last dose Friday   sertraline  (ZOLOFT ) 100 MG tablet Take 1 tablet (100 mg total) by mouth daily.     trimethoprim -polymyxin b  (POLYTRIM ) ophthalmic solution Place 1 drop into both eyes every 4 (four) hours while awake for 7 days.    [DISCONTINUED] Melatonin 5 MG CAPS Take 1 capsule by mouth at bedtime.    [DISCONTINUED] meloxicam  (MOBIC ) 7.5 MG tablet TAKE 1 TABLET BY MOUTH EVERY DAY 05/23/2023: Takes one daily   No facility-administered encounter medications on file as of 01/08/2024.   Allergies  Allergen Reactions   Sulfa Antibiotics     rash    ROS: no f/c or chills.  Some headaches at her temples.  Denies lightheadedness, but does report some equilibrium issues.  Denies falls. Denies tinnitus.  Some ear plugging/decreased hearing since this illness. No n/v/d, no bleeding, bruising or rash. Denies chest pain or shortness of breath.    PHYSICAL EXAM:  BP (!) 150/84   Pulse 88   Temp (!) 97.4 F (36.3 C) (Tympanic)   Ht 5\' 2"  (1.575 m)   Wt 159 lb (72.1 kg)   BMI 29.08 kg/m  BP with pt's monitor 147/78 (accurate)  Wt Readings from Last 3 Encounters:  01/08/24 159 lb (72.1 kg)  01/03/24 150 lb 9.6 oz (68.3 kg)  05/23/23 151 lb 6.4 oz (68.7 kg)    Well-appearing female, mildly anxious, in no acute distress. Accompanied by her husband today. HEENT: conjunctiva and sclera are clear (just very minimal injection of R conjunctiva). No crusting or drainage TM's and EACs normal, very trace effusion noted. No erythema or bulging. Nasal mucosa is moderately inflamed, no erythema or purulence. Sinuses nontender. OP is clear. Neck: no lymphadenopathy or mass Heart: regular rate and rhythm Lungs: clear bilaterally Back: nontender Extremities: no edema. Nontender at L PF, no pain anywhere on exam.  Area of discomfort is at central portion of the inferior calcaneous. No pain with PF stretch or palpation. Nontender over area of discomfort as well. Neuro: alert and oriented, cranial nerves grossly intact.     ASSESSMENT/PLAN:  Discomfort of both ears - reassurred that OM has  resolved. Compete course of ABX  Dysfunction of both eustachian tubes - contributing to some ear plugging. To cont flonase , reviewed proper technique, increase to 2 sprays daily. Cont antihistamines  Elevated blood pressure reading - improved, but still above goal on amlodipine  5mg . To continue for now, and monitor BP closely. May not need long term.  Pain of right heel - exam not c/w PF.  location of pain suggests poss contusion.  Improving, NT today. Reviewed proper shoewear  BP improved, still above goal on 5 mg amlodipine . Unclear if truly developed HTN, or if related to illness, pain, anxiety. Shown how to record BP's, including comments. Discussed how/when to taper amlodipine  (if BP's drop), and to restart if they increase again upon lowering dose or stopping med. Reviewed low Na diet. To f/u with Dr. Robina Chol in 3-4  weeks on her BP, sooner prn. Reassured re: her ears--which are significantly improved (compared to exams done elsewhere). Discussed her upcoming travel briefly--encouraged them to schedule travel consult to discuss poss meds.  I spent 53 minutes dedicated to the care of this patient, including pre-visit review of records, face to face time, post-visit ordering of testing and documentation.   Your ears look MUCH better--only slight bit of fluid remains. Continue to use the Flonase  (increase to 2 sprays into each nostril), and complete the course of prednisone . You may stop the eye drops if symptoms have been better for 1-2 days (or you can wait another day or so if only recently better).   Your blood pressure monitor was accurate today. Please be sure to follow a low sodium diet. Continue on the amlodipine  5 mg for now, since your blood pressure is slightly above normal while taking it.  It is much better than it was in the ER. If you notice your blood pressure dropping to <110/60, then you can cut the dose in half. If the blood pressure remains <110/60, then stop the  amlodipine  entirely. Continue to monitor your blood pressure. If you they are consistently >130/80 when not on any medication, then you may want to restart at 1/2 tablet, and follow up with Dr. Robina Chol.  Check your blood pressure once daily (after having taken your medication, not just beforehand). If it is high, relax and repeat in 5-10 minutes. You do not need to repeat it more than once. If feeling bad, you can recheck it later in the day.  I recommend that you schedule a travel consult with Dr. Robina Chol (or a travel clinic) prior to your trip to [place]. If you still have any ear plugging issues or discomfort, at the time of your trip, try taking Afrin spray prior to the flight (to avoid ear pain related to pressure changes).

## 2024-01-08 ENCOUNTER — Ambulatory Visit (INDEPENDENT_AMBULATORY_CARE_PROVIDER_SITE_OTHER): Admitting: Family Medicine

## 2024-01-08 ENCOUNTER — Encounter: Payer: Self-pay | Admitting: Family Medicine

## 2024-01-08 VITALS — BP 140/70 | HR 88 | Temp 97.4°F | Ht 62.0 in | Wt 159.0 lb

## 2024-01-08 DIAGNOSIS — H9203 Otalgia, bilateral: Secondary | ICD-10-CM | POA: Diagnosis not present

## 2024-01-08 DIAGNOSIS — H6993 Unspecified Eustachian tube disorder, bilateral: Secondary | ICD-10-CM

## 2024-01-08 DIAGNOSIS — R030 Elevated blood-pressure reading, without diagnosis of hypertension: Secondary | ICD-10-CM

## 2024-01-08 DIAGNOSIS — M79671 Pain in right foot: Secondary | ICD-10-CM | POA: Diagnosis not present

## 2024-01-08 NOTE — Patient Instructions (Signed)
 Your ears look MUCH better--only slight bit of fluid remains. Continue to use the Flonase  (increase to 2 sprays into each nostril), and complete the course of prednisone . You may stop the eye drops if symptoms have been better for 1-2 days (or you can wait another day or so if only recently better).   Your blood pressure monitor was accurate today. Please be sure to follow a low sodium diet. Continue on the amlodipine  5 mg for now, since your blood pressure is slightly above normal while taking it.  It is much better than it was in the ER. If you notice your blood pressure dropping to <110/60, then you can cut the dose in half. If the blood pressure remains <110/60, then stop the amlodipine  entirely. Continue to monitor your blood pressure. If you they are consistently >130/80 when not on any medication, then you may want to restart at 1/2 tablet, and follow up with Dr. Robina Chol.  Check your blood pressure once daily (after having taken your medication, not just beforehand). If it is high, relax and repeat in 5-10 minutes. You do not need to repeat it more than once. If feeling bad, you can recheck it later in the day.  I recommend that you schedule a travel consult with Dr. Robina Chol (or a travel clinic) prior to your trip to [place]. If you still have any ear plugging issues or discomfort, at the time of your trip, try taking Afrin spray prior to the flight (to avoid ear pain related to pressure changes).

## 2024-01-11 DIAGNOSIS — Z860101 Personal history of adenomatous and serrated colon polyps: Secondary | ICD-10-CM | POA: Diagnosis not present

## 2024-01-25 DIAGNOSIS — Z09 Encounter for follow-up examination after completed treatment for conditions other than malignant neoplasm: Secondary | ICD-10-CM | POA: Diagnosis not present

## 2024-01-25 DIAGNOSIS — Z860101 Personal history of adenomatous and serrated colon polyps: Secondary | ICD-10-CM | POA: Diagnosis not present

## 2024-01-25 DIAGNOSIS — D128 Benign neoplasm of rectum: Secondary | ICD-10-CM | POA: Diagnosis not present

## 2024-01-29 DIAGNOSIS — D128 Benign neoplasm of rectum: Secondary | ICD-10-CM | POA: Diagnosis not present

## 2024-01-31 ENCOUNTER — Encounter: Payer: Self-pay | Admitting: Family Medicine

## 2024-01-31 ENCOUNTER — Ambulatory Visit (INDEPENDENT_AMBULATORY_CARE_PROVIDER_SITE_OTHER): Admitting: Family Medicine

## 2024-01-31 VITALS — BP 140/80 | HR 102 | Wt 148.0 lb

## 2024-01-31 DIAGNOSIS — L723 Sebaceous cyst: Secondary | ICD-10-CM

## 2024-01-31 DIAGNOSIS — F419 Anxiety disorder, unspecified: Secondary | ICD-10-CM | POA: Diagnosis not present

## 2024-01-31 DIAGNOSIS — I10 Essential (primary) hypertension: Secondary | ICD-10-CM

## 2024-01-31 DIAGNOSIS — F338 Other recurrent depressive disorders: Secondary | ICD-10-CM | POA: Diagnosis not present

## 2024-01-31 MED ORDER — AMLODIPINE BESYLATE 5 MG PO TABS: 5.0000 mg | ORAL_TABLET | Freq: Every day | ORAL | 1 refills | Status: DC

## 2024-01-31 MED ORDER — SERTRALINE HCL 100 MG PO TABS
ORAL_TABLET | ORAL | 1 refills | Status: DC
Start: 2024-01-31 — End: 2024-07-24

## 2024-01-31 NOTE — Progress Notes (Signed)
   Subjective:    Patient ID: Marilyn Dixon, female    DOB: 05/07/1951, 73 y.o.   MRN: [id number]  HPI She is here for blood pressure recheck.  She is taking amlodipine .  She also has several other concerns.  She does have a lesion on her scalp that she has seen dermatology for in the past and was told it is a cyst.  She is not ready to have this removed.  She also states that she is still using Klonopin  to help with anxiety that occurs sometimes 3 times per week but states she thinks that the sertraline  has helped her overall.  She then mention difficulty with hearing.   Review of Systems     Objective:    Physical Exam Alert and in no distress.  Blood pressure is recorded.  It is roughly 140/80.  She does have a 4 cm cystic lesion present on the right parietal occipital area of the scalp.       Assessment & Plan:  Primary hypertension - Plan: amLODipine  (NORVASC ) 5 MG tablet  Seasonal affective disorder (HCC) - Plan: sertraline  (ZOLOFT ) 100 MG tablet  Anxiety - Plan: sertraline  (ZOLOFT ) 100 MG tablet  Sebaceous cyst Recommend she return for excision of the cyst when its convenient for her schedule. Continue on the Norvasc  and we will recheck her blood pressure with her next visit. I then explained that if she is needing to take the Klonopin  3 times per week then she definitely does not have her anxiety/depression under good control.  I will increase her sertraline  to 150 mg to see if that will help quiet everything down and we can again check this again when she comes in for an appointment in August.

## 2024-01-31 NOTE — Patient Instructions (Signed)
 Lets discuss how you are doing when you come in for your next exam

## 2024-02-02 DIAGNOSIS — Z823 Family history of stroke: Secondary | ICD-10-CM | POA: Diagnosis not present

## 2024-02-02 DIAGNOSIS — M1990 Unspecified osteoarthritis, unspecified site: Secondary | ICD-10-CM | POA: Diagnosis not present

## 2024-02-02 DIAGNOSIS — N182 Chronic kidney disease, stage 2 (mild): Secondary | ICD-10-CM | POA: Diagnosis not present

## 2024-02-02 DIAGNOSIS — R32 Unspecified urinary incontinence: Secondary | ICD-10-CM | POA: Diagnosis not present

## 2024-02-02 DIAGNOSIS — E785 Hyperlipidemia, unspecified: Secondary | ICD-10-CM | POA: Diagnosis not present

## 2024-02-02 DIAGNOSIS — H409 Unspecified glaucoma: Secondary | ICD-10-CM | POA: Diagnosis not present

## 2024-02-02 DIAGNOSIS — Z833 Family history of diabetes mellitus: Secondary | ICD-10-CM | POA: Diagnosis not present

## 2024-02-02 DIAGNOSIS — F411 Generalized anxiety disorder: Secondary | ICD-10-CM | POA: Diagnosis not present

## 2024-02-02 DIAGNOSIS — I129 Hypertensive chronic kidney disease with stage 1 through stage 4 chronic kidney disease, or unspecified chronic kidney disease: Secondary | ICD-10-CM | POA: Diagnosis not present

## 2024-02-02 DIAGNOSIS — Z9181 History of falling: Secondary | ICD-10-CM | POA: Diagnosis not present

## 2024-02-02 DIAGNOSIS — Z008 Encounter for other general examination: Secondary | ICD-10-CM | POA: Diagnosis not present

## 2024-02-02 DIAGNOSIS — Z8249 Family history of ischemic heart disease and other diseases of the circulatory system: Secondary | ICD-10-CM | POA: Diagnosis not present

## 2024-02-02 DIAGNOSIS — F329 Major depressive disorder, single episode, unspecified: Secondary | ICD-10-CM | POA: Diagnosis not present

## 2024-02-03 ENCOUNTER — Ambulatory Visit: Payer: Self-pay | Admitting: Family Medicine

## 2024-02-05 ENCOUNTER — Telehealth: Payer: Self-pay | Admitting: Family Medicine

## 2024-02-05 DIAGNOSIS — H918X9 Other specified hearing loss, unspecified ear: Secondary | ICD-10-CM

## 2024-02-05 NOTE — Telephone Encounter (Signed)
 Pt stopped by office wanted to know about her referral for her hearing, that was discussed at her appt she said on Thursday.  She didn't know if was referral to Audiologist or ENT, but she would like it ASAP.  She is going out of the country 02/19/24 thru 02/29/24 & is ok with anyone that accepts [facility]

## 2024-02-05 NOTE — Telephone Encounter (Signed)
 Eusebio High please handle this referral to Audiology Thanks

## 2024-02-07 ENCOUNTER — Ambulatory Visit: Attending: Family Medicine | Admitting: Audiologist

## 2024-02-07 DIAGNOSIS — H9201 Otalgia, right ear: Secondary | ICD-10-CM | POA: Diagnosis not present

## 2024-02-07 DIAGNOSIS — H903 Sensorineural hearing loss, bilateral: Secondary | ICD-10-CM | POA: Insufficient documentation

## 2024-02-07 NOTE — Procedures (Signed)
 Outpatient Audiology and [facility] [address] [place], [place]  [address] [phone]  AUDIOLOGICAL  EVALUATION  NAME: Marilyn Dixon     DOB:   1951/06/11      MRN: [id number]                                                                                     DATE: 02/07/2024     REFERENT: Watson Hacking, MD STATUS: Outpatient DIAGNOSIS: Otalgia Right Ear, Sensorineural Hearing Loss Bilateral    History: [name] was seen for an audiological evaluation due to difficulty hearing in noise ever since having in ear infection in April. Last month she had an infection in her right ear. She has had sinus issues for many years but this was her first ear infection. After the infection she had lingering difficulty hearing people clearly.  [name] had a pressure feeling on her right ear near the temple. She feel like someone is pressing down on that side. She denies any TMJ or teeth grinding. She had significant history of Anxiety. She denies any other symptoms of the right ear. She is concerned about flying to [place] in a few days, she will also be at high elevation. She does not want to damage her right ear any further.   Evaluation:  Otoscopy showed a clear view of the tympanic membranes, bilaterally Tympanometry results were consistent with normal middle ear function, bilaterally   Audiometric testing was completed using Conventional Audiometry techniques with insert earphones and supraural headphones. Test results are consistent with mild sloping to moderate sensorineural symmetric hearing loss. Hearing loss presents as presbycusis. Speech Recognition Thresholds were obtained at  40dB HL in the right ear and at 40dB HL in the left ear. Word Recognition Testing was completed at 80dB HL and [name] scored 82% in the right ear and 100% in the left.   Results:  The test results were reviewed with Garey Jung. The degree and type of hearing loss is consistent with age related changes.  There is no indication of lingering infection or middle ear issue. [name] has mild sloping to moderate sensorineural hearing loss bilaterally. The right ear is no significantly different from the left. She is likely noticing her age related hearing loss more now that she is paying close attention to what she can or cannot hear. Middle ear has normal pressure, bilaterally. Audiogram printed and provided to [name]. She needs hearing aids for both ears. For the tension on her right ear, recommend she consult a physician. Her hearing does not indicate any reason for this discomfort.  Recommendations: Hearing aids recommended for both ears. Patient given list of local hearing aid providers. Hearing loss is age related, not medical. No medical clearance needed. Annual audiometric testing recommended to monitor hearing loss for progression.  Use Valsalva method taught in office to equalize pressure while flying. Chew gum and open mouth wide like a yawn to help keep eustachian tubes functioning during pressure change.  Recommend referral to Otolaryngology due to right ear pain     52 minutes spent testing and counseling on results.   If you have any questions please feel free to contact me  at [phone].  Raynald Calkins Stalnaker Au.D.  Audiologist   02/07/2024  1:58 PM  Cc: Watson Hacking, MD

## 2024-02-09 ENCOUNTER — Telehealth: Payer: Self-pay | Admitting: Family Medicine

## 2024-02-09 DIAGNOSIS — H918X9 Other specified hearing loss, unspecified ear: Secondary | ICD-10-CM

## 2024-02-09 NOTE — Telephone Encounter (Signed)
 Patient wants to speak with Dr. Robina Chol about an ENT(ears, nose, and throat) referral.   She states she has spoken with Dr.Lalonde about this and she did the hearing test with audiology and they told her she needed hearing aids but she is wanting a referral to ENT for a second opinion.   She will be going out of the county from 6[place]6/12 and wont have good service if anyone tries to call her during that time but she will have good service on 03/04/24 and she is asking for a call this day if no one can call her before she leaves on 6/2.

## 2024-02-21 ENCOUNTER — Encounter (INDEPENDENT_AMBULATORY_CARE_PROVIDER_SITE_OTHER): Payer: Self-pay | Admitting: Otolaryngology

## 2024-03-18 DIAGNOSIS — H52203 Unspecified astigmatism, bilateral: Secondary | ICD-10-CM | POA: Diagnosis not present

## 2024-03-18 DIAGNOSIS — H26493 Other secondary cataract, bilateral: Secondary | ICD-10-CM | POA: Diagnosis not present

## 2024-03-18 DIAGNOSIS — H40013 Open angle with borderline findings, low risk, bilateral: Secondary | ICD-10-CM | POA: Diagnosis not present

## 2024-03-27 ENCOUNTER — Encounter: Payer: Self-pay | Admitting: Family Medicine

## 2024-03-27 ENCOUNTER — Ambulatory Visit: Admitting: Family Medicine

## 2024-03-27 VITALS — BP 120/80 | HR 85 | Wt 147.6 lb

## 2024-03-27 DIAGNOSIS — R052 Subacute cough: Secondary | ICD-10-CM | POA: Diagnosis not present

## 2024-03-27 DIAGNOSIS — J301 Allergic rhinitis due to pollen: Secondary | ICD-10-CM | POA: Diagnosis not present

## 2024-03-27 DIAGNOSIS — L723 Sebaceous cyst: Secondary | ICD-10-CM | POA: Diagnosis not present

## 2024-03-27 MED ORDER — ALBUTEROL SULFATE HFA 108 (90 BASE) MCG/ACT IN AERS: 2.0000 | INHALATION_SPRAY | Freq: Four times a day (QID) | RESPIRATORY_TRACT | 0 refills | Status: DC | PRN

## 2024-03-27 NOTE — Progress Notes (Signed)
   Subjective:    Patient ID: Marilyn Dixon, female    DOB: 10/07/1950, 73 y.o.   MRN: [id number]  HPI She is here for consult concerning sebaceous cyst on her scalp.  She also notes that she has a 3-week history of a dry cough but no fever, chills, sore throat or earache.  She does have an underlying history of allergic rhinitis.   Review of Systems     Objective:    Physical Exam Alert and in no distress. Tympanic membranes and canals are normal. Pharyngeal area is normal. Neck is supple without adenopathy or thyromegaly. Cardiac exam shows a regular sinus rhythm without murmurs or gallops. Lungs are clear to auscultation.  4 cm round smooth movable lesion is noted in the right parietal area.        Assessment & Plan:  Subacute cough - Plan: albuterol  (VENTOLIN  HFA) 108 (90 Base) MCG/ACT inhaler  Sebaceous cyst  Seasonal allergic rhinitis due to pollen The dry cough could be an asthma component as anything is worthwhile trying the albuterol  to see if that will help with her symptoms.  She will keep me informed as to the success of this. She will also set up a visit for excision of the cyst.

## 2024-04-04 ENCOUNTER — Encounter: Payer: Self-pay | Admitting: Family Medicine

## 2024-04-04 ENCOUNTER — Ambulatory Visit (INDEPENDENT_AMBULATORY_CARE_PROVIDER_SITE_OTHER): Admitting: Family Medicine

## 2024-04-04 VITALS — BP 130/80 | HR 91 | Wt 152.6 lb

## 2024-04-04 DIAGNOSIS — L723 Sebaceous cyst: Secondary | ICD-10-CM

## 2024-04-04 MED ORDER — LIDOCAINE HCL 1 % IJ SOLN
10.0000 mL | Freq: Once | INTRAMUSCULAR | Status: AC
  Administered 2024-04-04: 10 mL via INTRADERMAL

## 2024-04-04 MED ORDER — LIDOCAINE HCL 1 % IJ SOLN: 10.0000 mL | Freq: Once | INTRAMUSCULAR | Status: DC

## 2024-04-04 NOTE — Progress Notes (Signed)
   Subjective:    Patient ID: Marilyn Dixon, female    DOB: [birth year], 73 y.o.   MRN: [id number]  HPI She is here for excision of a really large sebaceous cyst present on her scalp.  Is on the right parietal area.   Review of Systems     Objective:    Physical Exam 84 cm round smooth movable lesion is noted.  Was injected with Xylocaine .  3 cm incision was made.  A large amount of brownish material was extruded from the wound and the cyst sac was initially partially removed and then I was able to get a hold of almost the entire sac without difficulty and pulled it from the wound.  Packing was not needed.  Pressure dressing was applied.  Recommend she use a ski stocking To apply pressure to this area.       Assessment & Plan:  Sebaceous cyst The cyst was excised as mentioned above.  She is to return here in 3 to 4 days to assess the healing process since it was a rather large lesion.  She is comfortable with that.

## 2024-04-04 NOTE — Addendum Note (Signed)
 Addended by: LATTIE CARLO BROCKS on: 04/04/2024 04:25 PM   Modules accepted: Orders

## 2024-04-09 ENCOUNTER — Encounter: Payer: Self-pay | Admitting: Family Medicine

## 2024-04-09 ENCOUNTER — Ambulatory Visit (INDEPENDENT_AMBULATORY_CARE_PROVIDER_SITE_OTHER): Admitting: Family Medicine

## 2024-04-09 VITALS — BP 120/80 | HR 89 | Wt 149.6 lb

## 2024-04-09 DIAGNOSIS — Z872 Personal history of diseases of the skin and subcutaneous tissue: Secondary | ICD-10-CM

## 2024-04-09 NOTE — Progress Notes (Signed)
   Subjective:    Patient ID: Marilyn Dixon, female    DOB: 1951/02/22, 73 y.o.   MRN: [id number]  HPI She is here for recheck on recent excision of a rather large sebaceous cyst on her scalp.  She has had no difficulty with that and it seems to be healing nicely with no evidence of any bleeding.   Review of Systems     Objective:    Physical Exam  Exam of the scalp shows that the lesion to be healing without evidence of infection or any drainage.      Assessment & Plan:  H/O sebaceous cyst She is to wash this daily and irrigate that area to keep hair from getting stuck in there and return here if any difficulty.  She was comfortable with that.

## 2024-04-19 ENCOUNTER — Other Ambulatory Visit: Payer: Self-pay | Admitting: Family Medicine

## 2024-04-19 DIAGNOSIS — R052 Subacute cough: Secondary | ICD-10-CM

## 2024-05-03 DIAGNOSIS — Z96651 Presence of right artificial knee joint: Secondary | ICD-10-CM | POA: Diagnosis not present

## 2024-05-21 ENCOUNTER — Ambulatory Visit: Payer: Medicare HMO

## 2024-05-21 ENCOUNTER — Ambulatory Visit: Admitting: Family Medicine

## 2024-05-21 ENCOUNTER — Encounter: Payer: Self-pay | Admitting: Family Medicine

## 2024-05-21 ENCOUNTER — Other Ambulatory Visit: Payer: Self-pay | Admitting: Family Medicine

## 2024-05-21 VITALS — BP 124/68 | HR 79 | Ht 64.0 in | Wt 151.0 lb

## 2024-05-21 VITALS — BP 118/60 | HR 96 | Temp 98.9°F | Ht 64.0 in | Wt 151.8 lb

## 2024-05-21 DIAGNOSIS — J301 Allergic rhinitis due to pollen: Secondary | ICD-10-CM

## 2024-05-21 DIAGNOSIS — Z23 Encounter for immunization: Secondary | ICD-10-CM

## 2024-05-21 DIAGNOSIS — R052 Subacute cough: Secondary | ICD-10-CM

## 2024-05-21 DIAGNOSIS — L723 Sebaceous cyst: Secondary | ICD-10-CM | POA: Diagnosis not present

## 2024-05-21 DIAGNOSIS — Z0000 Encounter for general adult medical examination without abnormal findings: Secondary | ICD-10-CM

## 2024-05-21 NOTE — Progress Notes (Signed)
   Subjective:    Patient ID: Marilyn Dixon, female    DOB: 30-Nov-1950, 73 y.o.   MRN: [id number]  Discussed the use of AI scribe software for clinical note transcription with the patient, who gave verbal consent to proceed.  History of Present Illness   Marilyn Dixon is a 73 year old female who presents with worsening allergy symptoms.  She began experiencing postnasal drainage, watery eyes, and a cough on Saturday. She also noted a runny nose, which she believes was triggered by being outside, possibly due to something blooming that she is allergic to.  She has a history of allergies and typically manages them with daily Zyrtec, which she has continued to take. However, she has not yet used her Flonase  nasal spray, which she has on hand. In the past, when her allergies have worsened despite taking Zyrtec, she has sometimes Dixon prescribed additional medication, though she does not recall the specific medication.  No fever or chills, but she has a slight sore throat, which she attributes to the drainage. She reports having had issues with a sebaceous cyst on her head in the past.     She has a previous history of removal of sebaceous cyst right parietal area no notes to smaller 1 on the left side.      Review of Systems     Objective:    Physical Exam Alert and in no distress. Tympanic membranes and canals are normal. Pharyngeal area is normal. Neck is supple without adenopathy or thyromegaly. Cardiac exam shows a regular sinus rhythm without murmurs or gallops. Lungs are clear to auscultation.  1/2 cm round smooth lesion is noted in the left parietal area.     Assessment & Plan:     Allergic rhinitis experiencing breakthrough symptoms. No bacterial infection, antibiotics not indicated. - Use Flonase  nasal spray, two squirts each nostril after blowing the nose. - Continue Zyrtec daily. - Return if symptoms worsen or do not improve.  Sebaceous cyst of scalp Small  sebaceous cyst, not infected or causing issues. Previous cyst treated successfully. - Monitor for changes in size or signs of infection. - Avoid manipulation unless problematic.  General Health Maintenance Flu vaccination given

## 2024-05-21 NOTE — Progress Notes (Signed)
 Subjective:   Marilyn Dixon is a 73 y.o. who presents for a Medicare Wellness preventive visit.  As a reminder, Annual Wellness Visits don't include a physical exam, and some assessments may be limited, especially if this visit is performed virtually. We may recommend an in-person follow-up visit with your provider if needed.  Visit Complete: In person    Persons Participating in Visit: Patient.  AWV Questionnaire: Yes: Patient Medicare AWV questionnaire was completed by the patient on 05/16/2024; I have confirmed that all information answered by patient is correct and no changes since this date.  Cardiac Risk Factors include: advanced age (>31men, >47 women);dyslipidemia     Objective:    Today's Vitals   05/21/24 1404 05/21/24 1405  BP: 118/60   Pulse: 96   Temp: 98.9 F (37.2 C)   TempSrc: Oral   SpO2: 97%   Weight: 151 lb 12.8 oz (68.9 kg)   Height: 5' 4 (1.626 m)   PainSc:  7    Body mass index is 26.06 kg/m.     05/21/2024    2:11 PM 05/16/2023    3:03 PM 05/13/2022    1:35 PM 08/26/2021   11:59 AM 05/10/2021    1:52 PM 12/17/2019   10:06 AM 12/11/2018    9:03 AM  Advanced Directives  Does Patient Have a Medical Advance Directive? Yes Yes Yes Yes Yes Yes Yes   Type of Estate agent of [place];Living will Healthcare Power of [place];Living will Healthcare Power of [place];Living will Healthcare Power of [place];Living will Healthcare Power of [employer];Living will Healthcare Power of [place];Living will Healthcare Power of [place];Living will  Does patient want to make changes to medical advance directive?    No - Patient declined Yes (ED - Information included in AVS) No - Patient declined No - Patient declined   Copy of Healthcare Power of Attorney in Chart? No - copy requested No - copy requested No - copy requested No - copy requested No - copy requested No - copy requested Yes - validated most recent copy scanned in chart (See row  information)      Data saved with a previous flowsheet row definition    Current Medications (verified) Outpatient Encounter Medications as of 05/21/2024  Medication Sig   albuterol  (VENTOLIN  HFA) 108 (90 Base) MCG/ACT inhaler TAKE 2 PUFFS BY MOUTH EVERY 6 HOURS AS NEEDED FOR WHEEZE OR SHORTNESS OF BREATH   amLODipine  (NORVASC ) 5 MG tablet Take 1 tablet (5 mg total) by mouth daily.   atorvastatin  (LIPITOR) 10 MG tablet Take 1 tablet (10 mg total) by mouth daily.   Black Cohosh 540 MG CAPS Take 1 capsule by mouth.   cetirizine (ZYRTEC) 10 MG tablet Take 10 mg by mouth daily.   Multiple Vitamins-Minerals (HAIR SKIN & NAILS PO) Take 1 tablet by mouth daily.   [DISCONTINUED] Multiple Vitamins-Minerals (HAIR SKIN AND NAILS FORMULA PO) Take 3 tablets by mouth daily.   amoxicillin -clavulanate (AUGMENTIN ) 875-125 MG tablet Take 1 tablet by mouth 2 (two) times daily.   clonazePAM  (KLONOPIN ) 0.5 MG tablet TAKE 1 TABLET BY MOUTH TWICE A DAY AS NEEDED FOR ANXIETY   fluticasone  (FLONASE ) 50 MCG/ACT nasal spray Place 1 spray into both nostrils daily. (Patient not taking: Reported on 04/04/2024)   promethazine -dextromethorphan (PROMETHAZINE -DM) 6.25-15 MG/5ML syrup Take 5 mLs by mouth 2 (two) times daily as needed for cough.   sertraline  (ZOLOFT ) 100 MG tablet 1-1/2 pills daily   No facility-administered encounter medications on file as  of 05/21/2024.    Allergies (verified) Sulfa antibiotics   History: Past Medical History:  Diagnosis Date   Allergy    Back pain    Dyslipidemia    Glaucoma    High cholesterol    Hx of adenomatous colonic polyps    Menopause    Seasonal affective disorder [facility])    Past Surgical History:  Procedure Laterality Date   COLONOSCOPY  2006/2011   Schooler,[place]   TOTAL KNEE ARTHROPLASTY Right 03/21/2023   History reviewed. No pertinent family history. Social History   Socioeconomic History   Marital status: Married    Spouse name: Not on file   Number of  children: Not on file   Years of education: Not on file   Highest education level: Bachelor's degree (e.g., BA, AB, BS)  Occupational History   Not on file  Tobacco Use   Smoking status: Never   Smokeless tobacco: Never  Vaping Use   Vaping status: Never Used  Substance and Sexual Activity   Alcohol use: Yes    Alcohol/week: 3.0 standard drinks of alcohol    Types: 3 drink(s) per week   Drug use: No   Sexual activity: Yes  Other Topics Concern   Not on file  Social History Narrative   Not on file   Social Drivers of Health   Financial Resource Strain: Low Risk  (05/21/2024)   Overall Financial Resource Strain (CARDIA)    Difficulty of Paying Living Expenses: Not hard at all  Food Insecurity: No Food Insecurity (05/21/2024)   Hunger Vital Sign    Worried About Running Out of Food in the Last Year: Never true    Ran Out of Food in the Last Year: Never true  Transportation Needs: No Transportation Needs (05/21/2024)   PRAPARE - Administrator, Civil Service (Medical): No    Lack of Transportation (Non-Medical): No  Physical Activity: Sufficiently Active (05/21/2024)   Exercise Vital Sign    Days of Exercise per Week: 7 days    Minutes of Exercise per Session: 50 min  Recent Concern: Physical Activity - Insufficiently Active (04/08/2024)   Exercise Vital Sign    Days of Exercise per Week: 3 days    Minutes of Exercise per Session: 40 min  Stress: No Stress Concern Present (05/21/2024)   [employer] of Occupational Health - Occupational Stress Questionnaire    Feeling of Stress: Not at all  Social Connections: Socially Integrated (05/21/2024)   Social Connection and Isolation Panel    Frequency of Communication with Friends and Family: More than three times a week    Frequency of Social Gatherings with Friends and Family: More than three times a week    Attends Religious Services: More than 4 times per year    Active Member of [employer] or Organizations: Yes    Attends  Engineer, structural: More than 4 times per year    Marital Status: Married    Tobacco Counseling Counseling given: Not Answered    Clinical Intake:  Pre-visit preparation completed: Yes  Pain : 0-10 Pain Score: 7  Pain Type: Acute pain Pain Location: Face Pain Descriptors / Indicators: Aching Pain Onset: 1 to 4 weeks ago Pain Frequency: Constant     Nutritional Risks: Nausea/ vomitting/ diarrhea (slight nausea) Diabetes: No  No results found for: HGBA1C   How often do you need to have someone help you when you read instructions, pamphlets, or other written materials from your doctor or pharmacy?:  1 - Never  Interpreter Needed?: No  Information entered by :: NAllen LPN   Activities of Daily Living     05/16/2024    1:00 PM  In your present state of health, do you have any difficulty performing the following activities:  Hearing? 0  Vision? 0  Difficulty concentrating or making decisions? 0  Walking or climbing stairs? 0  Dressing or bathing? 0  Doing errands, shopping? 0  Preparing Food and eating ? N  Using the Toilet? N  In the past six months, have you accidently leaked urine? N  Do you have problems with loss of bowel control? N  Managing your Medications? N  Managing your Finances? N  Housekeeping or managing your Housekeeping? N    Patient Care Team: Joyce Norleen BROCKS, MD as PCP - General (Family Medicine) [place], [facility] Melodi Lerner, MD as Consulting Physician (Orthopedic Surgery) Elnor Rome BROCKS, MD as Referring Physician (Specialist)  I have updated your Care Teams any recent Medical Services you may have received from other providers in the past year.     Assessment:   This is a routine wellness examination for Marilyn Dixon.  Hearing/Vision screen Hearing Screening - Comments:: Denies hearing issues Vision Screening - Comments:: Regular eye exams, [facility]   Goals Addressed             This Visit's  Progress    Patient Stated       05/21/2024, wants to lose 10-15 pounds       Depression Screen     05/21/2024    2:12 PM 05/16/2023    3:10 PM 05/13/2022    1:37 PM 05/10/2021    1:50 PM 02/26/2021   10:25 AM 12/17/2019    9:37 AM 12/11/2018    8:40 AM  PHQ 2/9 Scores  PHQ - 2 Score 0 0 0 0 0 1 0  PHQ- 9 Score 0  0        Fall Risk     05/16/2024    1:00 PM 05/23/2023    9:37 AM 05/16/2023    3:06 PM 03/13/2023    1:30 PM 01/11/2023   10:45 AM  Fall Risk   Falls in the past year? 0 1 1 1  0  Comment   due to the knee    Number falls in past yr: 0 0 1 0 0  Comment    03/08/23   Injury with Fall? 0 0 0 0 0  Comment    bruising to foreheas, black eyes and scraped up right elbow   Risk for fall due to : Medication side effect No Fall Risks History of fall(s);Impaired mobility;Impaired balance/gait;Medication side effect History of fall(s) No Fall Risks  Follow up Falls prevention discussed;Falls evaluation completed Falls evaluation completed Falls prevention discussed;Falls evaluation completed Falls evaluation completed Falls evaluation completed    MEDICARE RISK AT HOME:  Medicare Risk at Home Any stairs in or around the home?: (Patient-Rptd) Yes If so, are there any without handrails?: (Patient-Rptd) Yes Home free of loose throw rugs in walkways, pet beds, electrical cords, etc?: (Patient-Rptd) Yes Adequate lighting in your home to reduce risk of falls?: (Patient-Rptd) Yes Life alert?: (Patient-Rptd) No Use of a cane, walker or w/c?: (Patient-Rptd) No Grab bars in the bathroom?: (Patient-Rptd) Yes Shower chair or bench in shower?: (Patient-Rptd) Yes Elevated toilet seat or a handicapped toilet?: (Patient-Rptd) No  TIMED UP AND GO:  Was the test performed?  Yes  Length of time  to ambulate 10 feet: 5 sec Gait steady and fast without use of assistive device  Cognitive Function: 6CIT completed        05/21/2024    2:12 PM 05/16/2023    3:11 PM 05/13/2022    1:39 PM  6CIT  Screen  What Year? 0 points 0 points 0 points  What month? 0 points 0 points 0 points  What time? 0 points 0 points 0 points  Count back from 20 0 points 0 points 0 points  Months in reverse 0 points 0 points 0 points  Repeat phrase 0 points 4 points 0 points  Total Score 0 points 4 points 0 points    Immunizations Immunization History  Administered Date(s) Administered   DT (Pediatric) 06/27/1997   Fluad Quad(high Dose 65+) 06/14/2019, 07/17/2020, 07/14/2021, 06/08/2022   Fluad Trivalent(High Dose 65+) 05/23/2023   Hepatitis A 06/19/2007, 07/22/2010   INFLUENZA, HIGH DOSE SEASONAL PF 06/22/2015, 07/04/2016, 06/07/2017, 08/03/2018   Influenza Split 09/23/1999, 07/20/2012, 06/20/2013   Influenza Whole 09/24/2002, 07/17/2009, 05/09/2010   Influenza,inj,Quad PF,6+ Mos 05/20/2014   Influenza-Unspecified 08/03/2018   PFIZER(Purple Top)SARS-COV-2 Vaccination 10/26/2019, 11/20/2019, 06/19/2020, 03/23/2021   Pfizer Covid-19 Vaccine Bivalent Booster 67yrs & up 08/09/2021   Pfizer(Comirnaty)Fall Seasonal Vaccine 12 years and older 07/21/2022, 06/12/2023   Pneumococcal Conjugate-13 07/29/2013   Pneumococcal Polysaccharide-23 05/11/2005   Respiratory Syncytial Virus Vaccine,Recomb Aduvanted(Arexvy) 09/26/2022   Tdap 06/19/2007, 01/14/2011, 10/29/2021   Zoster Recombinant(Shingrix) 07/20/2017, 12/19/2017   Zoster, Live 07/22/2010    Screening Tests Health Maintenance  Topic Date Due   INFLUENZA VACCINE  04/19/2024   COVID-19 Vaccine (8 - 2024-25 season) 05/20/2024   Medicare Annual Wellness (AWV)  05/21/2025   MAMMOGRAM  10/09/2025   Colonoscopy  01/09/2031   DTaP/Tdap/Td (5 - Td or Tdap) 10/30/2031   DEXA SCAN  Completed   Hepatitis C Screening  Completed   Zoster Vaccines- Shingrix  Completed   HPV VACCINES  Aged Out   Meningococcal B Vaccine  Aged Out   Pneumococcal Vaccine: 50+ Years  Discontinued    Health Maintenance  Health Maintenance Due  Topic Date Due   INFLUENZA  VACCINE  04/19/2024   COVID-19 Vaccine (8 - 2024-25 season) 05/20/2024   Health Maintenance Items Addressed: Due for flu and covid vaccine.  Additional Screening:  Vision Screening: Recommended annual ophthalmology exams for early detection of glaucoma and other disorders of the eye. Would you like a referral to an eye doctor? No    Dental Screening: Recommended annual dental exams for proper oral hygiene  Community Resource Referral / Chronic Care Management: CRR required this visit?  No   CCM required this visit?  No   Plan:    I have personally reviewed and noted the following in the patient's chart:   Medical and social history Use of alcohol, tobacco or illicit drugs  Current medications and supplements including opioid prescriptions. Patient is not currently taking opioid prescriptions. Functional ability and status Nutritional status Physical activity Advanced directives List of other physicians Hospitalizations, surgeries, and ER visits in previous 12 months Vitals Screenings to include cognitive, depression, and falls Referrals and appointments  In addition, I have reviewed and discussed with patient certain preventive protocols, quality metrics, and best practice recommendations. A written personalized care plan for preventive services as well as general preventive health recommendations were provided to patient.   Marilyn FORBES Dawn, LPN   [id number]   After Visit Summary: (In Person-Printed) AVS printed and given to the patient  Notes:  Nothing significant to report at this time.

## 2024-05-21 NOTE — Patient Instructions (Signed)
 Marilyn Dixon , Thank you for taking time out of your busy schedule to complete your Annual Wellness Visit with me. I enjoyed our conversation and look forward to speaking with you again next year. I, as well as your care team,  appreciate your ongoing commitment to your health goals. Please review the following plan we discussed and let me know if I can assist you in the future. Your Game plan/ To Do List    Referrals: If you haven't heard from the office you've been referred to, please reach out to them at the phone provided.   Follow up Visits: We will see or speak with you next year for your Next Medicare AWV with our clinical staff Have you seen your provider in the last 6 months (3 months if uncontrolled diabetes)? Yes  Clinician Recommendations:  Aim for 30 minutes of exercise or brisk walking, 6-8 glasses of water, and 5 servings of fruits and vegetables each day.       This is a list of the screenings recommended for you:  Health Maintenance  Topic Date Due   Flu Shot  04/19/2024   COVID-19 Vaccine (8 - 2024-25 season) 05/20/2024   Medicare Annual Wellness Visit  05/21/2025   Mammogram  10/09/2025   Colon Cancer Screening  01/09/2031   DTaP/Tdap/Td vaccine (5 - Td or Tdap) 10/30/2031   DEXA scan (bone density measurement)  Completed   Hepatitis C Screening  Completed   Zoster (Shingles) Vaccine  Completed   HPV Vaccine  Aged Out   Meningitis B Vaccine  Aged Out   Pneumococcal Vaccine for age over 53  Discontinued    Advanced directives: (Copy Requested) Please bring a copy of your health care power of attorney and living will to the office to be added to your chart at your convenience. You can mail to [facility] [address] [place], [place] [address] or email to [email or web address] Advance Care Planning is important because it:  [x]  Makes sure you receive the medical care that is consistent with your values, goals, and preferences  [x]  It provides  guidance to your family and loved ones and reduces their decisional burden about whether or not they are making the right decisions based on your wishes.  Follow the link provided in your after visit summary or read over the paperwork we have mailed to you to help you started getting your Advance Directives in place. If you need assistance in completing these, please reach out to us  so that we can help you!  See attachments for Preventive Care and Fall Prevention Tips.

## 2024-06-03 DIAGNOSIS — Z23 Encounter for immunization: Secondary | ICD-10-CM | POA: Diagnosis not present

## 2024-06-27 ENCOUNTER — Ambulatory Visit: Payer: Medicare HMO | Admitting: Family Medicine

## 2024-06-27 ENCOUNTER — Encounter: Payer: Self-pay | Admitting: Family Medicine

## 2024-06-27 VITALS — BP 120/70 | HR 80 | Resp 16 | Ht 63.0 in | Wt 152.2 lb

## 2024-06-27 DIAGNOSIS — M8580 Other specified disorders of bone density and structure, unspecified site: Secondary | ICD-10-CM

## 2024-06-27 DIAGNOSIS — E785 Hyperlipidemia, unspecified: Secondary | ICD-10-CM

## 2024-06-27 DIAGNOSIS — Z0000 Encounter for general adult medical examination without abnormal findings: Secondary | ICD-10-CM | POA: Diagnosis not present

## 2024-06-27 DIAGNOSIS — Z23 Encounter for immunization: Secondary | ICD-10-CM

## 2024-06-27 DIAGNOSIS — K219 Gastro-esophageal reflux disease without esophagitis: Secondary | ICD-10-CM

## 2024-06-27 DIAGNOSIS — I10 Essential (primary) hypertension: Secondary | ICD-10-CM | POA: Diagnosis not present

## 2024-06-27 LAB — LIPID PANEL

## 2024-06-27 MED ORDER — AMLODIPINE BESYLATE 5 MG PO TABS
5.0000 mg | ORAL_TABLET | Freq: Every day | ORAL | 1 refills | Status: AC
Start: 2024-06-27 — End: 2024-08-08

## 2024-06-27 MED ORDER — ATORVASTATIN CALCIUM 10 MG PO TABS: 10.0000 mg | ORAL_TABLET | Freq: Every day | ORAL | 3 refills | Status: AC

## 2024-06-27 NOTE — Progress Notes (Signed)
 Name: Marilyn Dixon   Date of Visit: 06/27/24   Date of last visit with me: 05/21/2024   CHIEF COMPLAINT:  Chief Complaint  Patient presents with   Medicare Wellness    Patient is here for fasting CPE, had AWV with [place] on 05/21/24. Patient does not have any concerns today. Patient has had flu and covid vaccines, ok for updated Prevnar 20 today. Last colon 2022.       HPI:  Discussed the use of AI scribe software for clinical note transcription with the patient, who gave verbal consent to proceed.  History of Present Illness   Marilyn Dixon is a 73 year old female who presents for weight management and dietary consultation.  She desires to lose weight primarily to improve the fit of her clothing. She has been attending the gym three times a week, focusing on weight training and cycling, and is working with a Systems analyst to improve core and abdominal strength. She avoids high-impact activities such as running and lunges due to a total knee replacement in 2024.  Her current diet includes a breakfast of bagel, coffee, and fruit, with occasional cereal. Lunch typically consists of a sandwich with chicken or [place], and dinner alternates between fish, meat, and vegetables. She reports that most of her protein intake occurs during dinner and expresses interest in increasing her protein consumption throughout the day.  She has a history of total knee replacement in 2024, which influences her exercise choices. She is currently taking amlodipine  and atorvastatin  for blood pressure and cholesterol management, respectively, and also uses Flonase  and Zoloft .  Family history is significant for cardiovascular issues, with her father having died of a heart attack in his early 82s and a history of heart attacks and strokes on both sides of her family. Her mother lived to 69 years old.         OBJECTIVE:       05/21/2024    2:12 PM  Depression screen PHQ 2/9  Decreased Interest 0   Down, Depressed, Hopeless 0  PHQ - 2 Score 0  Altered sleeping 0  Tired, decreased energy 0  Change in appetite 0  Feeling bad or failure about yourself  0  Trouble concentrating 0  Moving slowly or fidgety/restless 0  Suicidal thoughts 0  PHQ-9 Score 0  Difficult doing work/chores Not difficult at all     BP Readings from Last 3 Encounters:  06/27/24 120/70  05/21/24 124/68  05/21/24 118/60    BP 120/70   Pulse 80   Resp 16   Ht 5' 3 (1.6 m)   Wt 152 lb 3.2 oz (69 kg)   BMI 26.96 kg/m    Physical Exam   MEASUREMENTS: Height- 5 foot 3, Weight- 150.      Physical Exam Constitutional:      Appearance: Normal appearance.  Neurological:     General: No focal deficit present.     Mental Status: She is alert and oriented to person, place, and time. Mental status is at baseline.     ASSESSMENT/PLAN:   Assessment & Plan Need for pneumococcal 20-valent conjugate vaccination  Primary hypertension  Hyperlipidemia LDL goal <100  Osteopenia, unspecified location  Routine general medical examination at a health care facility  Gastroesophageal reflux disease without esophagitis  Annual physical exam    Assessment and Plan    Obesity and Sarcopenia Obesity and sarcopenia related to postmenopausal status, with weight loss and muscle building challenges due to  decreased estrogen. - Increase protein intake to 130 grams per day. - Aim for 1000 calories per day for weight loss. - Incorporate egg whites and Fairlife milk into diet. - Use a food scale for portion measurement. - Continue exercise routine focusing on progressive overload. - Re-evaluate weight and dietary progress in 6-8 weeks. - Wt Readings from Last 3 Encounters:  06/27/24 152 lb 3.2 oz (69 kg)  05/21/24 151 lb (68.5 kg)  05/21/24 151 lb 12.8 oz (68.9 kg)  Spent at least 17 minutes discussing weight loss plans and caloric goals.   Essential hypertension Essential hypertension with amlodipine . -  Refill amlodipine  prescription. - CMP  Hyperlipidemia Hyperlipidemia with atorvastatin . - Refill atorvastatin  prescription. -Lipid panel  Annual physical Visit Routine adult wellness visit with no acute concerns. -  Comprehensive annual physical exam completed today. Reviewed interval history, current medical issues, medications, allergies, and preventive care needs. Addressed all patient questions and concerns. Discussed lifestyle factors including diet, exercise, sleep, and stress management. Reviewed recommended age-appropriate screenings, labs, and vaccinations. Counseling provided on healthy habits and routine health maintenance. Follow-up as indicated based on findings and results.         Izabel Chim A. Vita MD [facility]

## 2024-06-28 LAB — LIPID PANEL
Cholesterol, Total: 221 mg/dL — AB (ref 100–199)
HDL: 81 mg/dL (ref >39)
LDL CALC COMMENT:: 2.7 ratio (ref 0.0–4.4)
LDL Chol Calc (NIH): 127 mg/dL — AB (ref 0–99)
Triglycerides: 76 mg/dL (ref 0–149)
VLDL Cholesterol Cal: 13 mg/dL (ref 5–40)

## 2024-06-28 LAB — COMPREHENSIVE METABOLIC PANEL WITH GFR
ALT: 21 IU/L (ref 0–32)
AST: 23 IU/L (ref 0–40)
Albumin: 4.4 g/dL (ref 3.8–4.8)
Alkaline Phosphatase: 193 IU/L — ABNORMAL HIGH (ref 49–135)
BUN/Creatinine Ratio: 14 (ref 12–28)
BUN: 12 mg/dL (ref 8–27)
Bilirubin Total: 0.6 mg/dL (ref 0.0–1.2)
CO2: 21 mmol/L (ref 20–29)
Calcium: 9.9 mg/dL (ref 8.7–10.3)
Chloride: 104 mmol/L (ref 96–106)
Creatinine, Ser: 0.84 mg/dL (ref 0.57–1.00)
Globulin, Total: 2.4 g/dL (ref 1.5–4.5)
Glucose: 95 mg/dL (ref 70–99)
Potassium: 4.5 mmol/L (ref 3.5–5.2)
Sodium: 141 mmol/L (ref 134–144)
Total Protein: 6.8 g/dL (ref 6.0–8.5)
eGFR: 73 mL/min/1.73 (ref >59)

## 2024-06-28 LAB — CBC WITH DIFFERENTIAL/PLATELET
Basophils Absolute: 0 x10E3/uL (ref 0.0–0.2)
Basos: 1 %
EOS (ABSOLUTE): 0 x10E3/uL (ref 0.0–0.4)
Eos: 1 %
Hematocrit: 48.6 % — ABNORMAL HIGH (ref 34.0–46.6)
Hemoglobin: 15.8 g/dL (ref 11.1–15.9)
Immature Grans (Abs): 0 x10E3/uL (ref 0.0–0.1)
Immature Granulocytes: 0 %
Lymphocytes Absolute: 1.2 x10E3/uL (ref 0.7–3.1)
Lymphs: 21 %
MCH: 29.7 pg (ref 26.6–33.0)
MCHC: 32.5 g/dL (ref 31.5–35.7)
MCV: 91 fL (ref 79–97)
Monocytes Absolute: 0.3 x10E3/uL (ref 0.1–0.9)
Monocytes: 6 %
Neutrophils Absolute: 4.3 x10E3/uL (ref 1.4–7.0)
Neutrophils: 71 %
Platelets: 262 x10E3/uL (ref 150–450)
RBC: 5.32 x10E6/uL — ABNORMAL HIGH (ref 3.77–5.28)
RDW: 13 % (ref 11.7–15.4)
WBC: 6 x10E3/uL (ref 3.4–10.8)

## 2024-07-24 ENCOUNTER — Other Ambulatory Visit: Payer: Self-pay | Admitting: Family Medicine

## 2024-07-24 DIAGNOSIS — F338 Other recurrent depressive disorders: Secondary | ICD-10-CM

## 2024-07-24 DIAGNOSIS — F419 Anxiety disorder, unspecified: Secondary | ICD-10-CM

## 2024-07-29 ENCOUNTER — Encounter: Payer: Self-pay | Admitting: Family Medicine

## 2024-07-29 ENCOUNTER — Ambulatory Visit (INDEPENDENT_AMBULATORY_CARE_PROVIDER_SITE_OTHER): Payer: Self-pay | Admitting: Family Medicine

## 2024-07-29 VITALS — BP 128/72 | HR 90 | Wt 149.0 lb

## 2024-07-29 DIAGNOSIS — M25559 Pain in unspecified hip: Secondary | ICD-10-CM

## 2024-07-29 MED ORDER — MELOXICAM 15 MG PO TABS: 15.0000 mg | ORAL_TABLET | Freq: Every day | ORAL | 0 refills | Status: AC

## 2024-07-29 NOTE — Progress Notes (Signed)
   Name: Marilyn Dixon   Date of Visit: 07/29/24   Date of last visit with me: 06/27/2024   CHIEF COMPLAINT:  Chief Complaint  Patient presents with   Acute Visit    Hip pain. Started over 2 weeks ago, no falls. Goes down from side all the way down leg.        HPI:  Discussed the use of AI scribe software for clinical note transcription with the patient, who gave verbal consent to proceed.  History of Present Illness Marilyn Dixon is a 73 year old female who presents with hip pain.  She has been experiencing pain in the lateral aspect of the hip for the past two weeks, which is exacerbated by activities such as walking, climbing stairs, and changing positions in bed. The pain is also present when lying on the affected side, though it is only slightly bothersome in that position.  She recalls a training session with a trainer that caused some discomfort, but she has not had another session since. She has been performing exercises independently.  She has a history of knee surgery, which she believes may have influenced her gait. However, she feels that she is now walking with a more balanced gait, not favoring either side.  She has been using Biofreeze for pain relief, which seems to help, especially with the colder weather.  No recent falls or injuries that could have triggered the pain. She notes that the pain has been present for a while but has recently become more noticeable.     OBJECTIVE:       05/21/2024    2:12 PM  Depression screen PHQ 2/9  Decreased Interest 0  Down, Depressed, Hopeless 0  PHQ - 2 Score 0  Altered sleeping 0  Tired, decreased energy 0  Change in appetite 0  Feeling bad or failure about yourself  0  Trouble concentrating 0  Moving slowly or fidgety/restless 0  Suicidal thoughts 0  PHQ-9 Score 0   Difficult doing work/chores Not difficult at all     Data saved with a previous flowsheet row definition     BP Readings from Last 3  Encounters:  07/29/24 128/72  06/27/24 120/70  05/21/24 124/68    BP 128/72   Pulse 90   Wt 149 lb (67.6 kg)   SpO2 97%   BMI 26.39 kg/m    Physical Exam MUSCULOSKELETAL: Tenderness to palpation of the left greater trochanteric bursa.  There is significant weakness of the left hip with abduction against resistance compared to the right though there is weakness noted both sides.  Pain with hip abduction on the left  Physical Exam  ASSESSMENT/PLAN:   Assessment & Plan Greater trochanteric pain syndrome    Assessment and Plan Assessment & Plan Left greater trochanteric pain syndrome Chronic left greater trochanteric pain syndrome likely due to overuse and muscle weakness, exacerbated by increased activity. Arthritis unlikely. - Provided daily gluteal strengthening exercises. - Prescribed daily meloxicam  for two weeks, then as needed. - Advised against ibuprofen or Advil use concurrently. - Scheduled follow-up in six weeks to assess improvement and consider steroid injection if needed.     Taren Dymek A. Vita MD [facility]

## 2024-08-08 ENCOUNTER — Encounter: Payer: Self-pay | Admitting: Family Medicine

## 2024-08-08 ENCOUNTER — Ambulatory Visit: Payer: Self-pay | Admitting: Family Medicine

## 2024-08-08 VITALS — BP 124/76 | HR 79 | Wt 147.0 lb

## 2024-08-08 DIAGNOSIS — M25559 Pain in unspecified hip: Secondary | ICD-10-CM

## 2024-08-08 DIAGNOSIS — R634 Abnormal weight loss: Secondary | ICD-10-CM | POA: Diagnosis not present

## 2024-08-08 NOTE — Progress Notes (Signed)
 Name: Karlita Lichtman   Date of Visit: 08/08/24   Date of last visit with me: 07/29/2024   CHIEF COMPLAINT:  Chief Complaint  Patient presents with   Follow-up    Follow up on weight, hip is also much better, pain has decreased.        HPI:  Discussed the use of AI scribe software for clinical note transcription with the patient, who gave verbal consent to proceed.  History of Present Illness   Emmani Haniyyah Sakuma is a 73 year old female who presents for follow-up of hip pain and weight management.  She experiences significant improvement in her hip pain, which she attributes to her exercises and the use of meloxicam . She reports relief in her hip pain when getting in and out of a chair, walking, and getting out of bed. She has been using meloxicam  but has missed a day or two and is considering using it only for flare-ups.  She is actively working on [employer], having lost about five pounds over the past month. She attributes this to increased protein intake and regular exercise with a personal trainer. Her weight has decreased by two pounds in the last ten days. She is focusing on maintaining this progress through the holidays by balancing her diet and exercise routine. She alternates between yogurt, cottage cheese, and eggs to manage her diet without feeling restricted.         OBJECTIVE:       05/21/2024    2:12 PM  Depression screen PHQ 2/9  Decreased Interest 0  Down, Depressed, Hopeless 0  PHQ - 2 Score 0  Altered sleeping 0  Tired, decreased energy 0  Change in appetite 0  Feeling bad or failure about yourself  0  Trouble concentrating 0  Moving slowly or fidgety/restless 0  Suicidal thoughts 0  PHQ-9 Score 0   Difficult doing work/chores Not difficult at all     Data saved with a previous flowsheet row definition     BP Readings from Last 3 Encounters:  08/08/24 124/76  07/29/24 128/72  06/27/24 120/70    BP 124/76   Pulse 79   Wt 147 lb  (66.7 kg)   SpO2 97%   BMI 26.04 kg/m    Physical Exam          Physical Exam Constitutional:      Appearance: Normal appearance.  Neurological:     General: No focal deficit present.     Mental Status: She is alert and oriented to person, place, and time. Mental status is at baseline.     ASSESSMENT/PLAN:   Assessment & Plan Greater trochanteric pain syndrome  Weight loss    Assessment and Plan    Greater trochanter syndrome pain Significant improvement with exercises and meloxicam . Over 75% improvement, minimal discomfort in daily activities. - Discontinued meloxicam  unless flare-ups occur. - Use meloxicam  for three consecutive days during flare-ups. - Continue exercises as tolerated, ensuring pain does not exceed 4/10.  Weight management Weight decreased by two pounds over ten days. Following high-protein diet and regular exercise. Weight loss attributed to fat loss and muscle building. - Continue current diet and exercise regimen. - Monitor weight and adjust plan if plateau occurs. - Allow dietary flexibility during holidays, focus on weekly balance.     >=15 minutes spent in face-to-face behavioral counseling for obesity. Topics reviewed: - Dietary modifications focused on caloric reduction and improved nutrition - Increasing physical activity and developing sustainable exercise  goals - Setting realistic weight-loss targets (5-10% of baseline weight) - Addressing barriers, motivation, and accountability strategies - Discussed benefits of modest weight loss on comorbidities  Patient verbalized understanding and readiness to make changes. Follow-up scheduled for weight and lifestyle progress review. Total time spent on the date of the encounter was 32 minutes, which included reviewing the patient's chart, performing a history and physical exam, ordering and reviewing studies, coordinating care, and counseling the patient regarding diagnosis and treatment options.  The time spent was medically necessary and supports billing based on total time.    Shai Mckenzie A. Vita MD [facility]

## 2024-08-20 DIAGNOSIS — D225 Melanocytic nevi of trunk: Secondary | ICD-10-CM | POA: Diagnosis not present

## 2024-08-20 DIAGNOSIS — L814 Other melanin hyperpigmentation: Secondary | ICD-10-CM | POA: Diagnosis not present

## 2024-08-20 DIAGNOSIS — L820 Inflamed seborrheic keratosis: Secondary | ICD-10-CM | POA: Diagnosis not present

## 2024-08-20 DIAGNOSIS — L538 Other specified erythematous conditions: Secondary | ICD-10-CM | POA: Diagnosis not present

## 2024-08-20 DIAGNOSIS — L821 Other seborrheic keratosis: Secondary | ICD-10-CM | POA: Diagnosis not present

## 2024-08-26 ENCOUNTER — Other Ambulatory Visit: Payer: Self-pay | Admitting: Family Medicine

## 2024-08-26 DIAGNOSIS — Z1231 Encounter for screening mammogram for malignant neoplasm of breast: Secondary | ICD-10-CM

## 2024-09-04 ENCOUNTER — Ambulatory Visit: Admitting: Family Medicine

## 2024-10-10 ENCOUNTER — Ambulatory Visit

## 2024-10-15 ENCOUNTER — Ambulatory Visit

## 2025-05-27 ENCOUNTER — Ambulatory Visit: Admitting: Family Medicine

## 2025-05-27 ENCOUNTER — Ambulatory Visit

## 2025-06-27 ENCOUNTER — Encounter: Payer: Self-pay | Admitting: Family Medicine
